# Patient Record
Sex: Female | Born: 1950
Health system: Southern US, Community
[De-identification: ages and names within clinical notes are randomized; demographics above are authoritative.]

## PROBLEM LIST (undated history)

## (undated) DIAGNOSIS — I10 Essential (primary) hypertension: Secondary | ICD-10-CM

## (undated) DIAGNOSIS — C801 Malignant (primary) neoplasm, unspecified: Secondary | ICD-10-CM

## (undated) DIAGNOSIS — M8580 Other specified disorders of bone density and structure, unspecified site: Secondary | ICD-10-CM

## (undated) DIAGNOSIS — E669 Obesity, unspecified: Secondary | ICD-10-CM

## (undated) DIAGNOSIS — M952 Other acquired deformity of head: Secondary | ICD-10-CM

## (undated) DIAGNOSIS — G9601 Cranial cerebrospinal fluid leak, spontaneous: Secondary | ICD-10-CM

## (undated) DIAGNOSIS — G960 Cerebrospinal fluid leak: Principal | ICD-10-CM

## (undated) DIAGNOSIS — E7800 Pure hypercholesterolemia, unspecified: Secondary | ICD-10-CM

## (undated) HISTORY — PX: SKIN CANCER EXCISION: SHX779

## (undated) HISTORY — PX: FACIAL NERVE SURGERY: SHX630

## (undated) HISTORY — DX: Other specified disorders of bone density and structure, unspecified site: M85.80

## (undated) HISTORY — PX: PILONIDAL CYST EXCISION: SHX744

## (undated) HISTORY — PX: EYE SURGERY: SHX253

## (undated) HISTORY — DX: Obesity, unspecified: E66.9

## (undated) HISTORY — DX: Cerebrospinal fluid leak: G96.0

## (undated) HISTORY — DX: Cranial cerebrospinal fluid leak, spontaneous: G96.01

## (undated) HISTORY — PX: TRIGGER FINGER RELEASE: SHX641

## (undated) HISTORY — PX: BREAST SURGERY: SHX581

## (undated) HISTORY — DX: Essential (primary) hypertension: I10

## (undated) HISTORY — DX: Pure hypercholesterolemia, unspecified: E78.00

---

## 1996-11-21 HISTORY — PX: BREAST BIOPSY: SHX20

## 2004-07-09 ENCOUNTER — Ambulatory Visit: Payer: Self-pay | Admitting: Gastroenterology

## 2007-08-07 ENCOUNTER — Ambulatory Visit: Payer: Self-pay | Admitting: Gastroenterology

## 2012-04-11 LAB — HM DEXA SCAN

## 2012-04-18 ENCOUNTER — Ambulatory Visit: Payer: Self-pay

## 2012-06-10 ENCOUNTER — Ambulatory Visit: Payer: Self-pay | Admitting: Emergency Medicine

## 2012-11-14 LAB — HM COLONOSCOPY

## 2013-04-19 ENCOUNTER — Ambulatory Visit: Payer: Self-pay

## 2014-04-05 LAB — HM PAP SMEAR

## 2014-04-23 ENCOUNTER — Ambulatory Visit: Payer: Self-pay

## 2014-04-23 LAB — HM MAMMOGRAPHY

## 2015-04-07 ENCOUNTER — Encounter: Payer: Self-pay | Admitting: Unknown Physician Specialty

## 2015-05-13 DIAGNOSIS — G9601 Cranial cerebrospinal fluid leak, spontaneous: Secondary | ICD-10-CM | POA: Insufficient documentation

## 2015-05-13 DIAGNOSIS — E669 Obesity, unspecified: Secondary | ICD-10-CM

## 2015-05-13 DIAGNOSIS — M8580 Other specified disorders of bone density and structure, unspecified site: Secondary | ICD-10-CM

## 2015-05-13 DIAGNOSIS — G960 Cerebrospinal fluid leak: Principal | ICD-10-CM

## 2015-05-23 ENCOUNTER — Encounter: Payer: Self-pay | Admitting: Unknown Physician Specialty

## 2015-05-23 ENCOUNTER — Ambulatory Visit (INDEPENDENT_AMBULATORY_CARE_PROVIDER_SITE_OTHER): Payer: PPO | Admitting: Unknown Physician Specialty

## 2015-05-23 VITALS — BP 141/71 | HR 80 | Temp 98.8°F | Ht 62.6 in | Wt 236.4 lb

## 2015-05-23 DIAGNOSIS — I10 Essential (primary) hypertension: Secondary | ICD-10-CM | POA: Diagnosis not present

## 2015-05-23 DIAGNOSIS — Z23 Encounter for immunization: Secondary | ICD-10-CM

## 2015-05-23 DIAGNOSIS — Z0000 Encounter for general adult medical examination without abnormal findings: Secondary | ICD-10-CM

## 2015-05-23 DIAGNOSIS — E669 Obesity, unspecified: Secondary | ICD-10-CM

## 2015-05-23 DIAGNOSIS — M8580 Other specified disorders of bone density and structure, unspecified site: Secondary | ICD-10-CM

## 2015-05-23 MED ORDER — RALOXIFENE HCL 60 MG PO TABS: 60.0000 mg | ORAL_TABLET | Freq: Every day | ORAL | Status: DC

## 2015-05-23 NOTE — Assessment & Plan Note (Signed)
Refill Evista

## 2015-05-23 NOTE — Patient Instructions (Addendum)
DASH Eating Plan DASH stands for "Dietary Approaches to Stop Hypertension." The DASH eating plan is a healthy eating plan that has been shown to reduce high blood pressure (hypertension). Additional health benefits may include reducing the risk of type 2 diabetes mellitus, heart disease, and stroke. The DASH eating plan may also help with weight loss. WHAT DO I NEED TO KNOW ABOUT THE DASH EATING PLAN? For the DASH eating plan, you will follow these general guidelines:  Choose foods with a percent daily value for sodium of less than 5% (as listed on the food label).  Use salt-free seasonings or herbs instead of table salt or sea salt.  Check with your health care provider or pharmacist before using salt substitutes.  Eat lower-sodium products, often labeled as "lower sodium" or "no salt added."  Eat fresh foods.  Eat more vegetables, fruits, and low-fat dairy products.  Choose whole grains. Look for the word "whole" as the first word in the ingredient list.  Choose fish and skinless chicken or turkey more often than red meat. Limit fish, poultry, and meat to 6 oz (170 g) each day.  Limit sweets, desserts, sugars, and sugary drinks.  Choose heart-healthy fats.  Limit cheese to 1 oz (28 g) per day.  Eat more home-cooked food and less restaurant, buffet, and fast food.  Limit fried foods.  Cook foods using methods other than frying.  Limit canned vegetables. If you do use them, rinse them well to decrease the sodium.  When eating at a restaurant, ask that your food be prepared with less salt, or no salt if possible. WHAT FOODS CAN I EAT? Seek help from a dietitian for individual calorie needs. Grains Whole grain or whole wheat bread. Brown rice. Whole grain or whole wheat pasta. Quinoa, bulgur, and whole grain cereals. Low-sodium cereals. Corn or whole wheat flour tortillas. Whole grain cornbread. Whole grain crackers. Low-sodium crackers. Vegetables Fresh or frozen vegetables  (raw, steamed, roasted, or grilled). Low-sodium or reduced-sodium tomato and vegetable juices. Low-sodium or reduced-sodium tomato sauce and paste. Low-sodium or reduced-sodium canned vegetables.  Fruits All fresh, canned (in natural juice), or frozen fruits. Meat and Other Protein Products Ground beef (85% or leaner), grass-fed beef, or beef trimmed of fat. Skinless chicken or turkey. Ground chicken or turkey. Pork trimmed of fat. All fish and seafood. Eggs. Dried beans, peas, or lentils. Unsalted nuts and seeds. Unsalted canned beans. Dairy Low-fat dairy products, such as skim or 1% milk, 2% or reduced-fat cheeses, low-fat ricotta or cottage cheese, or plain low-fat yogurt. Low-sodium or reduced-sodium cheeses. Fats and Oils Tub margarines without trans fats. Light or reduced-fat mayonnaise and salad dressings (reduced sodium). Avocado. Safflower, olive, or canola oils. Natural peanut or almond butter. Other Unsalted popcorn and pretzels. The items listed above may not be a complete list of recommended foods or beverages. Contact your dietitian for more options. WHAT FOODS ARE NOT RECOMMENDED? Grains White bread. White pasta. White rice. Refined cornbread. Bagels and croissants. Crackers that contain trans fat. Vegetables Creamed or fried vegetables. Vegetables in a cheese sauce. Regular canned vegetables. Regular canned tomato sauce and paste. Regular tomato and vegetable juices. Fruits Dried fruits. Canned fruit in light or heavy syrup. Fruit juice. Meat and Other Protein Products Fatty cuts of meat. Ribs, chicken wings, bacon, sausage, bologna, salami, chitterlings, fatback, hot dogs, bratwurst, and packaged luncheon meats. Salted nuts and seeds. Canned beans with salt. Dairy Whole or 2% milk, cream, half-and-half, and cream cheese. Whole-fat or sweetened yogurt. Full-fat   cheeses or blue cheese. Nondairy creamers and whipped toppings. Processed cheese, cheese spreads, or cheese  curds. Condiments Onion and garlic salt, seasoned salt, table salt, and sea salt. Canned and packaged gravies. Worcestershire sauce. Tartar sauce. Barbecue sauce. Teriyaki sauce. Soy sauce, including reduced sodium. Steak sauce. Fish sauce. Oyster sauce. Cocktail sauce. Horseradish. Ketchup and mustard. Meat flavorings and tenderizers. Bouillon cubes. Hot sauce. Tabasco sauce. Marinades. Taco seasonings. Relishes. Fats and Oils Butter, stick margarine, lard, shortening, ghee, and bacon fat. Coconut, palm kernel, or palm oils. Regular salad dressings. Other Pickles and olives. Salted popcorn and pretzels. The items listed above may not be a complete list of foods and beverages to avoid. Contact your dietitian for more information. WHERE CAN I FIND MORE INFORMATION? National Heart, Lung, and Blood Institute: travelstabloid.com   This information is not intended to replace advice given to you by your health care provider. Make sure you discuss any questions you have with your health care provider.   Document Released: 06/03/2011 Document Revised: 07/05/2014 Document Reviewed: 04/18/2013 Elsevier Interactive Patient Education 2016 Elsevier Inc.  Please do call to schedule your mammogram; the number to schedule one at either [facility] or [facility] is [phone]

## 2015-05-23 NOTE — Assessment & Plan Note (Addendum)
Discussed diet and exercise.  Pt ed given.  She would like to start walking

## 2015-05-23 NOTE — Progress Notes (Signed)
BP 141/71 mmHg  Pulse 80  Temp(Src) 98.8 F (37.1 C)  Ht 5' 2.6" (1.59 m)  Wt 236 lb 6.4 oz (107.23 kg)  BMI 42.42 kg/m2  SpO2 99%  LMP  (LMP Unknown)   Subjective:    Patient ID: Heather James, female    DOB: [birth year], 64 y.o.   MRN: [id number]  HPI: Heather James is a 64 y.o. female  Chief Complaint  Patient presents with  . Establish Care   Depression screen [facility] 2/9 05/23/2015  Decreased Interest 0  Down, Depressed, Hopeless 0  PHQ - 2 Score 0    Functional Status Survey: Is the patient deaf or have difficulty hearing?: Yes (deaf in right ear) Does the patient have difficulty seeing, even when wearing glasses/contacts?: Yes (cannot see without glasses) Does the patient have difficulty concentrating, remembering, or making decisions?: No Does the patient have difficulty walking or climbing stairs?: No Does the patient have difficulty dressing or bathing?: No Does the patient have difficulty doing errands alone such as visiting a doctor's office or shopping?: No  Fall Risk  05/23/2015  Falls in the past year? No   Pt states that she will be [facility] and she is due to get her EKG, Cholesterol, Carotid US and possibly hepatitis C screening.    Relevant past medical, surgical, family and social history reviewed and updated as indicated. Interim medical history since our last visit reviewed. Allergies and medications reviewed and updated.  Review of Systems  Constitutional: Negative.   HENT: Negative.   Eyes: Negative.   Respiratory: Negative.   Cardiovascular: Negative.   Gastrointestinal: Negative.   Endocrine: Negative.   Genitourinary: Negative.   Musculoskeletal: Negative.   Skin: Negative.   Allergic/Immunologic: Negative.   Neurological: Negative.   Hematological: Negative.   Psychiatric/Behavioral: Negative.     Per HPI unless specifically indicated above     Objective:    BP 141/71 mmHg  Pulse  80  Temp(Src) 98.8 F (37.1 C)  Ht 5' 2.6" (1.59 m)  Wt 236 lb 6.4 oz (107.23 kg)  BMI 42.42 kg/m2  SpO2 99%  LMP  (LMP Unknown)  Wt Readings from Last 3 Encounters:  05/23/15 236 lb 6.4 oz (107.23 kg)  04/05/14 229 lb (103.874 kg)    Physical Exam  Constitutional: She is oriented to person, place, and time. She appears well-developed and well-nourished.  HENT:  Head: Normocephalic and atraumatic.  Right Ear: External ear normal.  Left Ear: Tympanic membrane, external ear and ear canal normal.  Right ear closure  Eyes: Pupils are equal, round, and reactive to light. Right eye exhibits no discharge. Left eye exhibits no discharge. No scleral icterus.  Neck: Normal range of motion. Neck supple. Carotid bruit is not present. No thyromegaly present.  Cardiovascular: Normal rate, regular rhythm and normal heart sounds.  Exam reveals no gallop and no friction rub.   No murmur heard. Pulmonary/Chest: Effort normal and breath sounds normal. No respiratory distress. She has no wheezes. She has no rales.  Abdominal: Soft. Bowel sounds are normal. There is no tenderness. There is no rebound.  Genitourinary: No breast swelling, tenderness or discharge.  Musculoskeletal: Normal range of motion.  Lymphadenopathy:    She has no cervical adenopathy.  Neurological: She is alert and oriented to person, place, and time.  Skin: Skin is warm, dry and intact. No rash noted.  Psychiatric: She has a normal mood and affect. Her speech is normal  and behavior is normal. Judgment and thought content normal. Cognition and memory are normal.      Assessment & Plan:   Problem List Items Addressed This Visit      Unprioritized   Osteopenia    Refill Evista      Obesity    Discussed diet and exercise.  Pt ed given.  She would like to start walking      Hypertension    Borderline high.  Will give information on DASH       Other Visit Diagnoses    Routine general medical examination at a health  care facility    -  Primary    Relevant Orders    Pneumococcal conjugate vaccine 13-valent IM (Completed)    MM DIGITAL SCREENING BILATERAL       No labs as we need to find out what they do at [facility].    Follow up plan: Return in about 6 months (around 11/20/2015) for Blood pressure.

## 2015-05-23 NOTE — Assessment & Plan Note (Signed)
Borderline high.  Will give information on DASH

## 2015-05-27 ENCOUNTER — Ambulatory Visit
Admission: RE | Admit: 2015-05-27 | Discharge: 2015-05-27 | Disposition: A | Discharge status: Home / Self Care | Payer: PPO | Source: Ambulatory Visit | Attending: Unknown Physician Specialty | Admitting: Unknown Physician Specialty

## 2015-05-27 DIAGNOSIS — Z1231 Encounter for screening mammogram for malignant neoplasm of breast: Secondary | ICD-10-CM | POA: Insufficient documentation

## 2015-05-27 DIAGNOSIS — Z0000 Encounter for general adult medical examination without abnormal findings: Secondary | ICD-10-CM

## 2015-06-26 ENCOUNTER — Ambulatory Visit
Admission: EM | Admit: 2015-06-26 | Discharge: 2015-06-26 | Disposition: A | Discharge status: Home / Self Care | Payer: PPO | Attending: Family Medicine | Admitting: Family Medicine

## 2015-06-26 ENCOUNTER — Encounter: Payer: Self-pay | Admitting: *Deleted

## 2015-06-26 DIAGNOSIS — J0100 Acute maxillary sinusitis, unspecified: Secondary | ICD-10-CM | POA: Diagnosis not present

## 2015-06-26 LAB — RAPID STREP SCREEN (MED CTR MEBANE ONLY): STREPTOCOCCUS, GROUP A SCREEN (DIRECT): NEGATIVE

## 2015-06-26 MED ORDER — AMOXICILLIN-POT CLAVULANATE 875-125 MG PO TABS
1.0000 | ORAL_TABLET | Freq: Two times a day (BID) | ORAL | Status: DC
Start: 2015-06-26 — End: 2015-07-11

## 2015-06-26 NOTE — Discharge Instructions (Signed)

## 2015-06-26 NOTE — ED Provider Notes (Signed)
[facility]  ____________________________________________  Time seen: Approximately 2:52 PM  I have reviewed the triage vital signs and the nursing notes.   HISTORY  Chief Complaint Nasal Congestion and Sore Throat   HPI Khadesia Wadzinski is a 64 y.o. female presents with a complaint of one week of runny nose, nasal congestion. Reports the last 3 days she has progressed with sore throat loss of her voice as well as sinus pressure. States getting thick greenish nasal drainage. Reports occasional cough is mostly at night with drainage in the back or throat.  Denies current pain. Denies chest pain, shortness breath, abdominal pain, dizziness or weakness. Reports continues to eat and drink well. Reports that she felt she had a fever this morning but did not check it. Reports unrelieved with over-the-counter cough congestion medicines. Denies recent sickness. Denies recent antibiotic use.  PCP: C. Carrizozo    Past Medical History  Diagnosis Date  . CSF leak from ear   . Osteopenia   . Obesity     Patient Active Problem List   Diagnosis Date Noted  . Hypertension 05/23/2015  . CSF leak from ear 05/13/2015  . Osteopenia 05/13/2015  . Obesity 05/13/2015    Past Surgical History  Procedure Laterality Date  . Facial nerve surgery    . Breast surgery Left     cyst removed  . Pilonidal cyst excision    . Breast biopsy Left 11/21/96    neg  Right ear surgery.   Current Outpatient Rx  Name  Route  Sig  Dispense  Refill  . aspirin 81 MG tablet   Oral   Take 81 mg by mouth daily.         . mometasone (NASONEX) 50 MCG/ACT nasal spray   Nasal   Place 2 sprays into the nose daily.         . raloxifene (EVISTA) 60 MG tablet   Oral   Take 1 tablet (60 mg total) by mouth daily.   90 tablet   3   .             Allergies Review of patient's allergies indicates no known allergies.  Family History  Problem Relation Age of Onset  . Heart disease Mother   .  Diabetes Mother   . Hypertension Mother   . Cancer Mother     breast  . Breast cancer Mother 73  . Heart disease Father   . Diabetes Father   . Hypertension Brother   . Cancer Maternal Grandmother     spine  . Heart disease Maternal Grandfather   . Heart disease Paternal Grandmother   . Emphysema Paternal Grandfather     Social History Social History  Substance Use Topics  . Smoking status: Never Smoker   . Smokeless tobacco: Never Used  . Alcohol Use: No    Review of Systems Constitutional: Positive subjective fever Eyes: No visual changes. ENT: Positive runny nose, nasal congestion, sore throat. Cardiovascular: Denies chest pain. Respiratory: Denies shortness of breath. Gastrointestinal: No abdominal pain.  No nausea, no vomiting.  No diarrhea.  No constipation. Genitourinary: Negative for dysuria. Musculoskeletal: Negative for back pain. Skin: Negative for rash. Neurological: Negative for headaches, focal weakness or numbness.  10-point ROS otherwise negative.  ____________________________________________   PHYSICAL EXAM:  VITAL SIGNS: ED Triage Vitals  Enc Vitals Group     BP 06/26/15 1358 131/62 mmHg     Pulse Rate 06/26/15 1358 105 Recheck 88  Resp 06/26/15 1358 20     Temp 06/26/15 1358 99.3 F (37.4 C)     Temp Source 06/26/15 1358 Oral     SpO2 06/26/15 1358 100 %     Weight 06/26/15 1358 229 lb (103.874 kg)     Height 06/26/15 1358 5' 4.5" (1.638 m)     Head Cir --      Peak Flow --      Pain Score --      Pain Loc --      Pain Edu? --      Excl. in [place]? --     Constitutional: Alert and oriented. Well appearing and in no acute distress. Eyes: Conjunctivae are normal. PERRL. EOMI. Head: Atraumatic. Mild to moderate tenderness to palpation bilateral maxillary sinuses, no swelling. No erythema.  Ears: Left: No erythema, normal TM. Right: No canal present,  unable to visualize internal ear.  Nose: Nasal congestion and bilateral nasal  turbinate erythema.  Mouth/Throat: Mucous membranes are moist.  Mild pharyngeal erythema, no tonsillar swelling or exudate. Neck: No stridor.  No cervical spine tenderness to palpation. Hematological/Lymphatic/Immunilogical: No cervical lymphadenopathy. Cardiovascular: Normal rate, regular rhythm. Grossly normal heart sounds.  Good peripheral circulation. Respiratory: Normal respiratory effort.  No retractions. Lungs CTAB. No wheezes, rales or rhonchi. Good air movement.  Gastrointestinal: Soft and nontender Musculoskeletal: No lower or upper extremity tenderness nor edema.Bilateral pedal pulses equal and easily palpated.  Neurologic:  Normal speech and language. No gross focal neurologic deficits are appreciated. No gait instability. Skin:  Skin is warm, dry and intact. No rash noted. Psychiatric: Mood and affect are normal. Speech and behavior are normal.  ____________________________________________   LABS (all labs ordered are listed, but only abnormal results are displayed)  Labs Reviewed  RAPID STREP SCREEN (NOT AT [facility])  CULTURE, GROUP A STREP ([place])    INITIAL IMPRESSION / ASSESSMENT AND PLAN / ED COURSE  Pertinent labs & imaging results that were available during my care of the patient were reviewed by me and considered in my medical decision making (see chart for details).  Very well-appearing patient in no acute distress. Presents for 1 week of runny nose, nasal congestion and sinus pressure which is increasing the last 3 days. Lungs clear throughout. Abdomen soft and nontender. Will treat maxillary sinusitis with oral Augmentin and supportive treatments. Follow with PCP as needed.   Discussed follow up with Primary care physician this week. Discussed follow up and return parameters including no resolution or any worsening concerns. Patient verbalized understanding and agreed to plan.   ____________________________________________   FINAL CLINICAL IMPRESSION(S) / ED  DIAGNOSES  Final diagnoses:  Acute maxillary sinusitis, recurrence not specified       Marylene Land, NP 06/26/15 1506

## 2015-06-26 NOTE — ED Notes (Signed)
Nasal congestion started 3 days ago and has progressed to sore throat and loss of voice. Patient tried OTC medication treatment without success.

## 2015-06-28 LAB — CULTURE, GROUP A STREP (THRC)

## 2015-07-03 ENCOUNTER — Telehealth: Payer: Self-pay | Admitting: Unknown Physician Specialty

## 2015-07-03 NOTE — Telephone Encounter (Signed)
Patient went to [facility] urgent care and she was diagnosed with acute membrane sinusitis. Patient is still not feeling well and wants to know if Malachy Mood can call her in something else for it. Pt best number to call is [phone].

## 2015-07-03 NOTE — Telephone Encounter (Signed)
Routing to provider  

## 2015-07-04 NOTE — Telephone Encounter (Signed)
Tried to call patient but there was no answer and no voicemail so I will try to call again later.

## 2015-07-04 NOTE — Telephone Encounter (Signed)
Patient returned my call and I told her what Heather James said. Patient stated she was going to call [facility].

## 2015-07-04 NOTE — Telephone Encounter (Signed)
She can call urgent care or be seen here

## 2015-07-11 ENCOUNTER — Encounter: Payer: Self-pay | Admitting: Unknown Physician Specialty

## 2015-07-11 ENCOUNTER — Ambulatory Visit (INDEPENDENT_AMBULATORY_CARE_PROVIDER_SITE_OTHER): Payer: PPO | Admitting: Unknown Physician Specialty

## 2015-07-11 VITALS — BP 145/91 | HR 74 | Temp 98.3°F | Ht 62.5 in | Wt 234.6 lb

## 2015-07-11 DIAGNOSIS — R05 Cough: Secondary | ICD-10-CM

## 2015-07-11 DIAGNOSIS — J0101 Acute recurrent maxillary sinusitis: Secondary | ICD-10-CM

## 2015-07-11 DIAGNOSIS — R059 Cough, unspecified: Secondary | ICD-10-CM

## 2015-07-11 MED ORDER — DOXYCYCLINE HYCLATE 100 MG PO TABS: 100.0000 mg | ORAL_TABLET | Freq: Two times a day (BID) | ORAL | Status: DC

## 2015-07-11 MED ORDER — HYDROCOD POLST-CPM POLST ER 10-8 MG/5ML PO SUER: 5.0000 mL | Freq: Two times a day (BID) | ORAL | Status: DC | PRN

## 2015-07-11 NOTE — Progress Notes (Signed)
BP 145/91 mmHg  Pulse 74  Temp(Src) 98.3 F (36.8 C)  Ht 5' 2.5" (1.588 m)  Wt 234 lb 9.6 oz (106.414 kg)  BMI 42.20 kg/m2  SpO2 98%  LMP  (LMP Unknown)   Subjective:    Patient ID: Heather James, female    DOB: 08-31-1950, 65 y.o.   MRN: [id number]  HPI: Heather James is a 65 y.o. female  Chief Complaint  Patient presents with  . URI    pt states she was seen at [facility], given amoxicillin, but when she finished taking it, she states she started feeling bad again. Cough, nasal pressure, and tiredness.    Diagnosed with sinusitis 2 weeks ago.  Felt better during Amoxicillin but worse again.  Getting chills, hacking cough, and sinus pressure.  Fatigue after the "first little thing."  Relevant past medical, surgical, family and social history reviewed and updated as indicated. Interim medical history since our last visit reviewed. Allergies and medications reviewed and updated.  Review of Systems  Per HPI unless specifically indicated above     Objective:    BP 145/91 mmHg  Pulse 74  Temp(Src) 98.3 F (36.8 C)  Ht 5' 2.5" (1.588 m)  Wt 234 lb 9.6 oz (106.414 kg)  BMI 42.20 kg/m2  SpO2 98%  LMP  (LMP Unknown)  Wt Readings from Last 3 Encounters:  07/11/15 234 lb 9.6 oz (106.414 kg)  06/26/15 229 lb (103.874 kg)  05/23/15 236 lb 6.4 oz (107.23 kg)    Physical Exam  Constitutional: She is oriented to person, place, and time. She appears well-developed and well-nourished. No distress.  HENT:  Head: Normocephalic and atraumatic.  Right Ear: Tympanic membrane and ear canal normal.  Left Ear: Tympanic membrane and ear canal normal.  Nose: No rhinorrhea. Right sinus exhibits maxillary sinus tenderness. Right sinus exhibits no frontal sinus tenderness. Left sinus exhibits maxillary sinus tenderness. Left sinus exhibits no frontal sinus tenderness.  Eyes: Conjunctivae and lids are normal. Right eye exhibits no discharge. Left eye exhibits no discharge.  No scleral icterus.  Cardiovascular: Normal rate and regular rhythm.   Pulmonary/Chest: Effort normal and breath sounds normal. No respiratory distress.  Abdominal: Normal appearance. There is no splenomegaly or hepatomegaly.  Musculoskeletal: Normal range of motion.  Neurological: She is alert and oriented to person, place, and time.  Skin: Skin is intact. No rash noted. No pallor.  Psychiatric: She has a normal mood and affect. Her behavior is normal. Judgment and thought content normal.    Results for orders placed or performed during the hospital encounter of 06/26/15  Rapid strep screen  Result Value Ref Range   Streptococcus, Group A Screen (Direct) NEGATIVE NEGATIVE  Culture, group A strep ([facility] only)  Result Value Ref Range   Specimen Description THROAT    Special Requests NONE Reflexed from [id number]    Culture NO BETA HEMOLYTIC STREPTOCOCCI ISOLATED    Report Status 06/28/2015 FINAL       Assessment & Plan:   Problem List Items Addressed This Visit    None    Visit Diagnoses    Acute recurrent maxillary sinusitis    -  Primary    Relevant Medications    doxycycline (VIBRA-TABS) 100 MG tablet    chlorpheniramine-HYDROcodone (TUSSIONEX PENNKINETIC ER) 10-8 MG/5ML SUER    Cough        Tussionex for cough        Follow up plan: Return if symptoms worsen or fail  to improve.

## 2015-07-21 ENCOUNTER — Ambulatory Visit: Payer: PPO | Admitting: Unknown Physician Specialty

## 2015-07-29 DIAGNOSIS — H2513 Age-related nuclear cataract, bilateral: Secondary | ICD-10-CM | POA: Diagnosis not present

## 2015-08-25 DIAGNOSIS — D0471 Carcinoma in situ of skin of right lower limb, including hip: Secondary | ICD-10-CM | POA: Diagnosis not present

## 2015-08-25 DIAGNOSIS — D485 Neoplasm of uncertain behavior of skin: Secondary | ICD-10-CM | POA: Diagnosis not present

## 2015-10-20 DIAGNOSIS — D0471 Carcinoma in situ of skin of right lower limb, including hip: Secondary | ICD-10-CM | POA: Diagnosis not present

## 2015-12-01 ENCOUNTER — Ambulatory Visit: Payer: PPO | Admitting: Unknown Physician Specialty

## 2015-12-08 ENCOUNTER — Encounter: Payer: Self-pay | Admitting: Unknown Physician Specialty

## 2015-12-08 ENCOUNTER — Ambulatory Visit (INDEPENDENT_AMBULATORY_CARE_PROVIDER_SITE_OTHER): Payer: PPO | Admitting: Unknown Physician Specialty

## 2015-12-08 VITALS — BP 129/81 | HR 83 | Temp 98.0°F | Ht 62.0 in | Wt 235.2 lb

## 2015-12-08 DIAGNOSIS — Z0000 Encounter for general adult medical examination without abnormal findings: Secondary | ICD-10-CM | POA: Diagnosis not present

## 2015-12-08 NOTE — Progress Notes (Signed)
   BP 129/81 mmHg  Pulse 83  Temp(Src) 98 F (36.7 C)  Ht 5\' 2"  (1.575 m)  Wt 235 lb 3.2 oz (106.686 kg)  BMI 43.01 kg/m2  SpO2 97%  LMP  (LMP Unknown)   Subjective:    Patient ID: Heather James, female    DOB: [birth year], 65 y.o.   MRN: [id number]  HPI: Heather James is a 66 y.o. female  Chief Complaint  Patient presents with  . Hypertension  . Labs Only    Hep C and HIV orders entered   Hypertension Stable not on BP medications   Relevant past medical, surgical, family and social history reviewed and updated as indicated. Interim medical history since our last visit reviewed. Allergies and medications reviewed and updated.  Review of Systems  Per HPI unless specifically indicated above     Objective:    BP 129/81 mmHg  Pulse 83  Temp(Src) 98 F (36.7 C)  Ht 5\' 2"  (1.575 m)  Wt 235 lb 3.2 oz (106.686 kg)  BMI 43.01 kg/m2  SpO2 97%  LMP  (LMP Unknown)  Wt Readings from Last 3 Encounters:  12/08/15 235 lb 3.2 oz (106.686 kg)  07/11/15 234 lb 9.6 oz (106.414 kg)  06/26/15 229 lb (103.874 kg)    Physical Exam  Constitutional: She is oriented to person, place, and time. She appears well-developed and well-nourished. No distress.  HENT:  Head: Normocephalic and atraumatic.  Eyes: Conjunctivae and lids are normal. Right eye exhibits no discharge. Left eye exhibits no discharge. No scleral icterus.  Cardiovascular: Normal rate.   Pulmonary/Chest: Effort normal.  Abdominal: Normal appearance. There is no splenomegaly or hepatomegaly.  Musculoskeletal: Normal range of motion.  Neurological: She is alert and oriented to person, place, and time.  Skin: Skin is intact. No rash noted. No pallor.  Psychiatric: She has a normal mood and affect. Her behavior is normal. Judgment and thought content normal.    Results for orders placed or performed during the hospital encounter of 06/26/15  Rapid strep screen  Result Value Ref Range   Streptococcus, Group A  Screen (Direct) NEGATIVE NEGATIVE  Culture, group A strep ([facility] only)  Result Value Ref Range   Specimen Description THROAT    Special Requests NONE Reflexed from [id number]    Culture NO BETA HEMOLYTIC STREPTOCOCCI ISOLATED    Report Status 06/28/2015 FINAL       Assessment & Plan:   Problem List Items Addressed This Visit    None    Visit Diagnoses    Health care maintenance    -  Primary    Relevant Orders    Hepatitis C antibody    HIV antibody        Follow up plan: Return for Recheck in November for PE.

## 2015-12-09 LAB — HIV ANTIBODY (ROUTINE TESTING W REFLEX): HIV Screen 4th Generation wRfx: NONREACTIVE

## 2015-12-09 LAB — HEPATITIS C ANTIBODY: Hep C Virus Ab: <0.1 s/co ratio (ref 0.0–0.9)

## 2016-01-12 DIAGNOSIS — M955 Acquired deformity of pelvis: Secondary | ICD-10-CM | POA: Diagnosis not present

## 2016-01-12 DIAGNOSIS — M5136 Other intervertebral disc degeneration, lumbar region: Secondary | ICD-10-CM | POA: Diagnosis not present

## 2016-01-12 DIAGNOSIS — M9903 Segmental and somatic dysfunction of lumbar region: Secondary | ICD-10-CM | POA: Diagnosis not present

## 2016-01-12 DIAGNOSIS — M9905 Segmental and somatic dysfunction of pelvic region: Secondary | ICD-10-CM | POA: Diagnosis not present

## 2016-01-14 DIAGNOSIS — M5136 Other intervertebral disc degeneration, lumbar region: Secondary | ICD-10-CM | POA: Diagnosis not present

## 2016-01-14 DIAGNOSIS — M9903 Segmental and somatic dysfunction of lumbar region: Secondary | ICD-10-CM | POA: Diagnosis not present

## 2016-01-14 DIAGNOSIS — M955 Acquired deformity of pelvis: Secondary | ICD-10-CM | POA: Diagnosis not present

## 2016-01-14 DIAGNOSIS — M9905 Segmental and somatic dysfunction of pelvic region: Secondary | ICD-10-CM | POA: Diagnosis not present

## 2016-01-16 DIAGNOSIS — M955 Acquired deformity of pelvis: Secondary | ICD-10-CM | POA: Diagnosis not present

## 2016-01-16 DIAGNOSIS — M9903 Segmental and somatic dysfunction of lumbar region: Secondary | ICD-10-CM | POA: Diagnosis not present

## 2016-01-16 DIAGNOSIS — M9905 Segmental and somatic dysfunction of pelvic region: Secondary | ICD-10-CM | POA: Diagnosis not present

## 2016-01-16 DIAGNOSIS — M5136 Other intervertebral disc degeneration, lumbar region: Secondary | ICD-10-CM | POA: Diagnosis not present

## 2016-01-19 DIAGNOSIS — M9903 Segmental and somatic dysfunction of lumbar region: Secondary | ICD-10-CM | POA: Diagnosis not present

## 2016-01-19 DIAGNOSIS — M5136 Other intervertebral disc degeneration, lumbar region: Secondary | ICD-10-CM | POA: Diagnosis not present

## 2016-01-19 DIAGNOSIS — M9905 Segmental and somatic dysfunction of pelvic region: Secondary | ICD-10-CM | POA: Diagnosis not present

## 2016-01-19 DIAGNOSIS — M955 Acquired deformity of pelvis: Secondary | ICD-10-CM | POA: Diagnosis not present

## 2016-01-21 DIAGNOSIS — M9905 Segmental and somatic dysfunction of pelvic region: Secondary | ICD-10-CM | POA: Diagnosis not present

## 2016-01-21 DIAGNOSIS — M5136 Other intervertebral disc degeneration, lumbar region: Secondary | ICD-10-CM | POA: Diagnosis not present

## 2016-01-21 DIAGNOSIS — M9903 Segmental and somatic dysfunction of lumbar region: Secondary | ICD-10-CM | POA: Diagnosis not present

## 2016-01-21 DIAGNOSIS — M955 Acquired deformity of pelvis: Secondary | ICD-10-CM | POA: Diagnosis not present

## 2016-01-23 DIAGNOSIS — M9903 Segmental and somatic dysfunction of lumbar region: Secondary | ICD-10-CM | POA: Diagnosis not present

## 2016-01-23 DIAGNOSIS — M955 Acquired deformity of pelvis: Secondary | ICD-10-CM | POA: Diagnosis not present

## 2016-01-23 DIAGNOSIS — M9905 Segmental and somatic dysfunction of pelvic region: Secondary | ICD-10-CM | POA: Diagnosis not present

## 2016-01-23 DIAGNOSIS — M5136 Other intervertebral disc degeneration, lumbar region: Secondary | ICD-10-CM | POA: Diagnosis not present

## 2016-01-26 DIAGNOSIS — M9903 Segmental and somatic dysfunction of lumbar region: Secondary | ICD-10-CM | POA: Diagnosis not present

## 2016-01-26 DIAGNOSIS — M5136 Other intervertebral disc degeneration, lumbar region: Secondary | ICD-10-CM | POA: Diagnosis not present

## 2016-01-26 DIAGNOSIS — M9905 Segmental and somatic dysfunction of pelvic region: Secondary | ICD-10-CM | POA: Diagnosis not present

## 2016-01-26 DIAGNOSIS — M955 Acquired deformity of pelvis: Secondary | ICD-10-CM | POA: Diagnosis not present

## 2016-01-28 DIAGNOSIS — M955 Acquired deformity of pelvis: Secondary | ICD-10-CM | POA: Diagnosis not present

## 2016-01-28 DIAGNOSIS — M5136 Other intervertebral disc degeneration, lumbar region: Secondary | ICD-10-CM | POA: Diagnosis not present

## 2016-01-28 DIAGNOSIS — M9905 Segmental and somatic dysfunction of pelvic region: Secondary | ICD-10-CM | POA: Diagnosis not present

## 2016-01-28 DIAGNOSIS — M9903 Segmental and somatic dysfunction of lumbar region: Secondary | ICD-10-CM | POA: Diagnosis not present

## 2016-01-30 DIAGNOSIS — M5136 Other intervertebral disc degeneration, lumbar region: Secondary | ICD-10-CM | POA: Diagnosis not present

## 2016-01-30 DIAGNOSIS — M9903 Segmental and somatic dysfunction of lumbar region: Secondary | ICD-10-CM | POA: Diagnosis not present

## 2016-01-30 DIAGNOSIS — M9905 Segmental and somatic dysfunction of pelvic region: Secondary | ICD-10-CM | POA: Diagnosis not present

## 2016-01-30 DIAGNOSIS — M955 Acquired deformity of pelvis: Secondary | ICD-10-CM | POA: Diagnosis not present

## 2016-02-02 DIAGNOSIS — M5136 Other intervertebral disc degeneration, lumbar region: Secondary | ICD-10-CM | POA: Diagnosis not present

## 2016-02-02 DIAGNOSIS — M9903 Segmental and somatic dysfunction of lumbar region: Secondary | ICD-10-CM | POA: Diagnosis not present

## 2016-02-02 DIAGNOSIS — M955 Acquired deformity of pelvis: Secondary | ICD-10-CM | POA: Diagnosis not present

## 2016-02-02 DIAGNOSIS — M9905 Segmental and somatic dysfunction of pelvic region: Secondary | ICD-10-CM | POA: Diagnosis not present

## 2016-02-04 DIAGNOSIS — M5136 Other intervertebral disc degeneration, lumbar region: Secondary | ICD-10-CM | POA: Diagnosis not present

## 2016-02-04 DIAGNOSIS — M955 Acquired deformity of pelvis: Secondary | ICD-10-CM | POA: Diagnosis not present

## 2016-02-04 DIAGNOSIS — M9903 Segmental and somatic dysfunction of lumbar region: Secondary | ICD-10-CM | POA: Diagnosis not present

## 2016-02-04 DIAGNOSIS — M9905 Segmental and somatic dysfunction of pelvic region: Secondary | ICD-10-CM | POA: Diagnosis not present

## 2016-02-06 DIAGNOSIS — M9905 Segmental and somatic dysfunction of pelvic region: Secondary | ICD-10-CM | POA: Diagnosis not present

## 2016-02-06 DIAGNOSIS — M955 Acquired deformity of pelvis: Secondary | ICD-10-CM | POA: Diagnosis not present

## 2016-02-06 DIAGNOSIS — M9903 Segmental and somatic dysfunction of lumbar region: Secondary | ICD-10-CM | POA: Diagnosis not present

## 2016-02-06 DIAGNOSIS — M5136 Other intervertebral disc degeneration, lumbar region: Secondary | ICD-10-CM | POA: Diagnosis not present

## 2016-02-09 DIAGNOSIS — M5136 Other intervertebral disc degeneration, lumbar region: Secondary | ICD-10-CM | POA: Diagnosis not present

## 2016-02-09 DIAGNOSIS — M955 Acquired deformity of pelvis: Secondary | ICD-10-CM | POA: Diagnosis not present

## 2016-02-09 DIAGNOSIS — M9905 Segmental and somatic dysfunction of pelvic region: Secondary | ICD-10-CM | POA: Diagnosis not present

## 2016-02-09 DIAGNOSIS — M9903 Segmental and somatic dysfunction of lumbar region: Secondary | ICD-10-CM | POA: Diagnosis not present

## 2016-02-13 DIAGNOSIS — M9905 Segmental and somatic dysfunction of pelvic region: Secondary | ICD-10-CM | POA: Diagnosis not present

## 2016-02-13 DIAGNOSIS — M9903 Segmental and somatic dysfunction of lumbar region: Secondary | ICD-10-CM | POA: Diagnosis not present

## 2016-02-13 DIAGNOSIS — M5136 Other intervertebral disc degeneration, lumbar region: Secondary | ICD-10-CM | POA: Diagnosis not present

## 2016-02-13 DIAGNOSIS — M955 Acquired deformity of pelvis: Secondary | ICD-10-CM | POA: Diagnosis not present

## 2016-02-16 DIAGNOSIS — M9903 Segmental and somatic dysfunction of lumbar region: Secondary | ICD-10-CM | POA: Diagnosis not present

## 2016-02-16 DIAGNOSIS — M5136 Other intervertebral disc degeneration, lumbar region: Secondary | ICD-10-CM | POA: Diagnosis not present

## 2016-02-16 DIAGNOSIS — M955 Acquired deformity of pelvis: Secondary | ICD-10-CM | POA: Diagnosis not present

## 2016-02-16 DIAGNOSIS — M9905 Segmental and somatic dysfunction of pelvic region: Secondary | ICD-10-CM | POA: Diagnosis not present

## 2016-02-18 DIAGNOSIS — M9905 Segmental and somatic dysfunction of pelvic region: Secondary | ICD-10-CM | POA: Diagnosis not present

## 2016-02-18 DIAGNOSIS — M9903 Segmental and somatic dysfunction of lumbar region: Secondary | ICD-10-CM | POA: Diagnosis not present

## 2016-02-18 DIAGNOSIS — M955 Acquired deformity of pelvis: Secondary | ICD-10-CM | POA: Diagnosis not present

## 2016-02-18 DIAGNOSIS — M5136 Other intervertebral disc degeneration, lumbar region: Secondary | ICD-10-CM | POA: Diagnosis not present

## 2016-02-20 DIAGNOSIS — D2261 Melanocytic nevi of right upper limb, including shoulder: Secondary | ICD-10-CM | POA: Diagnosis not present

## 2016-02-20 DIAGNOSIS — D2371 Other benign neoplasm of skin of right lower limb, including hip: Secondary | ICD-10-CM | POA: Diagnosis not present

## 2016-02-20 DIAGNOSIS — D2262 Melanocytic nevi of left upper limb, including shoulder: Secondary | ICD-10-CM | POA: Diagnosis not present

## 2016-02-20 DIAGNOSIS — D225 Melanocytic nevi of trunk: Secondary | ICD-10-CM | POA: Diagnosis not present

## 2016-02-23 DIAGNOSIS — M955 Acquired deformity of pelvis: Secondary | ICD-10-CM | POA: Diagnosis not present

## 2016-02-23 DIAGNOSIS — M9905 Segmental and somatic dysfunction of pelvic region: Secondary | ICD-10-CM | POA: Diagnosis not present

## 2016-02-23 DIAGNOSIS — M5136 Other intervertebral disc degeneration, lumbar region: Secondary | ICD-10-CM | POA: Diagnosis not present

## 2016-02-23 DIAGNOSIS — M9903 Segmental and somatic dysfunction of lumbar region: Secondary | ICD-10-CM | POA: Diagnosis not present

## 2016-03-02 DIAGNOSIS — M5136 Other intervertebral disc degeneration, lumbar region: Secondary | ICD-10-CM | POA: Diagnosis not present

## 2016-03-02 DIAGNOSIS — M9903 Segmental and somatic dysfunction of lumbar region: Secondary | ICD-10-CM | POA: Diagnosis not present

## 2016-03-02 DIAGNOSIS — M955 Acquired deformity of pelvis: Secondary | ICD-10-CM | POA: Diagnosis not present

## 2016-03-02 DIAGNOSIS — M9905 Segmental and somatic dysfunction of pelvic region: Secondary | ICD-10-CM | POA: Diagnosis not present

## 2016-03-09 DIAGNOSIS — M9905 Segmental and somatic dysfunction of pelvic region: Secondary | ICD-10-CM | POA: Diagnosis not present

## 2016-03-09 DIAGNOSIS — M9903 Segmental and somatic dysfunction of lumbar region: Secondary | ICD-10-CM | POA: Diagnosis not present

## 2016-03-09 DIAGNOSIS — M5136 Other intervertebral disc degeneration, lumbar region: Secondary | ICD-10-CM | POA: Diagnosis not present

## 2016-03-09 DIAGNOSIS — M955 Acquired deformity of pelvis: Secondary | ICD-10-CM | POA: Diagnosis not present

## 2016-03-19 ENCOUNTER — Encounter: Payer: Self-pay | Admitting: Unknown Physician Specialty

## 2016-03-19 ENCOUNTER — Ambulatory Visit (INDEPENDENT_AMBULATORY_CARE_PROVIDER_SITE_OTHER): Payer: PPO | Admitting: Unknown Physician Specialty

## 2016-03-19 VITALS — BP 131/84 | HR 94 | Temp 98.5°F | Wt 232.8 lb

## 2016-03-19 DIAGNOSIS — J069 Acute upper respiratory infection, unspecified: Secondary | ICD-10-CM

## 2016-03-19 DIAGNOSIS — J40 Bronchitis, not specified as acute or chronic: Secondary | ICD-10-CM | POA: Diagnosis not present

## 2016-03-19 MED ORDER — HYDROCOD POLST-CPM POLST ER 10-8 MG/5ML PO SUER: 5.0000 mL | Freq: Two times a day (BID) | ORAL | 0 refills | Status: DC | PRN

## 2016-03-19 MED ORDER — AZITHROMYCIN 250 MG PO TABS: ORAL_TABLET | ORAL | 0 refills | Status: DC

## 2016-03-19 NOTE — Progress Notes (Signed)
BP 131/84 (BP Location: Left Arm, Patient Position: Sitting, Cuff Size: Large)   Pulse 94   Temp 98.5 F (36.9 C)   Wt 232 lb 12.8 oz (105.6 kg)   LMP  (LMP Unknown)   SpO2 97%   BMI 42.58 kg/m    Subjective:    Patient ID: Heather James, female    DOB: [birth year], 65 y.o.   MRN: [id number]  HPI: Heather James is a 65 y.o. female  Chief Complaint  Patient presents with  . URI    pt states she has been having a cough, chest congestiong, sinus pressure, fever, and headache since Monday. States she has tried taking Mucinex.    URI   This is a new problem. The current episode started in the past 7 days. The problem has been unchanged. Maximum temperature: off and on as high as 101. Associated symptoms include chest pain, congestion, coughing, headaches and sinus pain. Pertinent negatives include no ear pain. Associated symptoms comments: Cough and headache are the worst and keeping her from sleeping. Treatments tried: Mucinex and cough syrup. The treatment provided no relief.    Relevant past medical, surgical, family and social history reviewed and updated as indicated. Interim medical history since our last visit reviewed. Allergies and medications reviewed and updated.  Review of Systems  HENT: Positive for congestion. Negative for ear pain.   Respiratory: Positive for cough.   Cardiovascular: Positive for chest pain.  Neurological: Positive for headaches.    Per HPI unless specifically indicated above     Objective:    BP 131/84 (BP Location: Left Arm, Patient Position: Sitting, Cuff Size: Large)   Pulse 94   Temp 98.5 F (36.9 C)   Wt 232 lb 12.8 oz (105.6 kg)   LMP  (LMP Unknown)   SpO2 97%   BMI 42.58 kg/m   Wt Readings from Last 3 Encounters:  03/19/16 232 lb 12.8 oz (105.6 kg)  12/08/15 235 lb 3.2 oz (106.7 kg)  07/11/15 234 lb 9.6 oz (106.4 kg)    Physical Exam  Constitutional: She is oriented to person, place, and time. She appears  well-developed and well-nourished. No distress.  HENT:  Head: Normocephalic and atraumatic.  Right Ear: Tympanic membrane and ear canal normal.  Left Ear: Tympanic membrane and ear canal normal.  Nose: Rhinorrhea present. Right sinus exhibits no maxillary sinus tenderness and no frontal sinus tenderness. Left sinus exhibits no maxillary sinus tenderness and no frontal sinus tenderness.  Mouth/Throat: Mucous membranes are normal. Posterior oropharyngeal erythema present.  Eyes: Conjunctivae and lids are normal. Right eye exhibits no discharge. Left eye exhibits no discharge. No scleral icterus.  Cardiovascular: Normal rate and regular rhythm.   Pulmonary/Chest: Effort normal. No respiratory distress. She has decreased breath sounds. She has no wheezes. She has rhonchi in the right lower field and the left lower field. She has rales in the left lower field.  Abdominal: Normal appearance. There is no splenomegaly or hepatomegaly.  Musculoskeletal: Normal range of motion.  Neurological: She is alert and oriented to person, place, and time.  Skin: Skin is intact. No rash noted. No pallor.  Psychiatric: She has a normal mood and affect. Her behavior is normal. Judgment and thought content normal.  Nursing note and vitals reviewed.   Results for orders placed or performed in visit on 12/08/15  Hepatitis C antibody  Result Value Ref Range   Hep C Virus Ab <0.1 0.0 - 0.9 s/co ratio  HIV antibody  Result Value Ref Range   HIV Screen 4th Generation wRfx Non Reactive Non Reactive      Assessment & Plan:   Problem List Items Addressed This Visit    None    Visit Diagnoses    URI (upper respiratory infection)    -  Primary   Relevant Medications   azithromycin (ZITHROMAX) 250 MG tablet   Bronchitis           Follow up plan: Return if symptoms worsen or fail to improve.

## 2016-03-23 DIAGNOSIS — M955 Acquired deformity of pelvis: Secondary | ICD-10-CM | POA: Diagnosis not present

## 2016-03-23 DIAGNOSIS — M9903 Segmental and somatic dysfunction of lumbar region: Secondary | ICD-10-CM | POA: Diagnosis not present

## 2016-03-23 DIAGNOSIS — M9905 Segmental and somatic dysfunction of pelvic region: Secondary | ICD-10-CM | POA: Diagnosis not present

## 2016-03-23 DIAGNOSIS — M5136 Other intervertebral disc degeneration, lumbar region: Secondary | ICD-10-CM | POA: Diagnosis not present

## 2016-03-24 ENCOUNTER — Telehealth: Payer: Self-pay | Admitting: Unknown Physician Specialty

## 2016-03-24 NOTE — Telephone Encounter (Signed)
Called and spoke to patient. She states that the fever is gone and she feels better than she did when she came in. She states that she is still coughing up phlegm. Patient wants to know if she should give the z-pack a little more time or does she need something else sent in.

## 2016-03-24 NOTE — Telephone Encounter (Signed)
Patient notified of Cheryl's instructions.

## 2016-03-24 NOTE — Telephone Encounter (Signed)
I would give it another couple of days.  Tell her to let us know on Friday if she is not continuing to get better

## 2016-03-24 NOTE — Telephone Encounter (Signed)
Please ask her to verify.  If she is feeling no better, another z pack is unlikely to help and will need to try something different.  If feeling some better, it should continue to work for 10 days.

## 2016-03-30 DIAGNOSIS — M955 Acquired deformity of pelvis: Secondary | ICD-10-CM | POA: Diagnosis not present

## 2016-03-30 DIAGNOSIS — M9903 Segmental and somatic dysfunction of lumbar region: Secondary | ICD-10-CM | POA: Diagnosis not present

## 2016-03-30 DIAGNOSIS — M5136 Other intervertebral disc degeneration, lumbar region: Secondary | ICD-10-CM | POA: Diagnosis not present

## 2016-03-30 DIAGNOSIS — M9905 Segmental and somatic dysfunction of pelvic region: Secondary | ICD-10-CM | POA: Diagnosis not present

## 2016-04-22 ENCOUNTER — Other Ambulatory Visit: Payer: Self-pay | Admitting: Unknown Physician Specialty

## 2016-04-22 DIAGNOSIS — Z1231 Encounter for screening mammogram for malignant neoplasm of breast: Secondary | ICD-10-CM

## 2016-04-27 DIAGNOSIS — M9903 Segmental and somatic dysfunction of lumbar region: Secondary | ICD-10-CM | POA: Diagnosis not present

## 2016-04-27 DIAGNOSIS — M955 Acquired deformity of pelvis: Secondary | ICD-10-CM | POA: Diagnosis not present

## 2016-04-27 DIAGNOSIS — M5136 Other intervertebral disc degeneration, lumbar region: Secondary | ICD-10-CM | POA: Diagnosis not present

## 2016-04-27 DIAGNOSIS — M9905 Segmental and somatic dysfunction of pelvic region: Secondary | ICD-10-CM | POA: Diagnosis not present

## 2016-05-05 ENCOUNTER — Ambulatory Visit (INDEPENDENT_AMBULATORY_CARE_PROVIDER_SITE_OTHER): Payer: PPO

## 2016-05-05 ENCOUNTER — Ambulatory Visit (INDEPENDENT_AMBULATORY_CARE_PROVIDER_SITE_OTHER): Payer: PPO | Admitting: Podiatry

## 2016-05-05 ENCOUNTER — Encounter: Payer: Self-pay | Admitting: Podiatry

## 2016-05-05 VITALS — BP 132/75 | HR 74 | Resp 16 | Ht 63.0 in | Wt 231.0 lb

## 2016-05-05 DIAGNOSIS — M779 Enthesopathy, unspecified: Secondary | ICD-10-CM

## 2016-05-05 DIAGNOSIS — M79671 Pain in right foot: Secondary | ICD-10-CM

## 2016-05-05 MED ORDER — TRIAMCINOLONE ACETONIDE 10 MG/ML IJ SUSP
10.0000 mg | Freq: Once | INTRAMUSCULAR | Status: AC
  Administered 2016-05-05: 10 mg

## 2016-05-05 NOTE — Progress Notes (Signed)
   Subjective:    Patient ID: Heather James, female    DOB: May 30, 1951, 65 y.o.   MRN: [id number]  HPI Chief Complaint  Patient presents with  . Foot Pain    Right foot; lateral side-below 5th toe; pt stated, "Was just walking and sock got caught up in tennis shoe a couple of weeks ago"      Review of Systems  All other systems reviewed and are negative.      Objective:   Physical Exam        Assessment & Plan:

## 2016-05-05 NOTE — Progress Notes (Signed)
Subjective:     Patient ID: Heather James, female   DOB: 1950-12-17, 65 y.o.   MRN: [id number]  HPI patient states she's developed a lot of pain in the outside of her right foot over the last month and does not remember specific injury   Review of Systems  All other systems reviewed and are negative.      Objective:   Physical Exam  Constitutional: She is oriented to person, place, and time.  Cardiovascular: Intact distal pulses.   Musculoskeletal: Normal range of motion.  Neurological: She is alert and oriented to person, place, and time.  Skin: Skin is warm.  Nursing note and vitals reviewed.  neurovascular status intact muscle strength adequate and inflammation pain in the lateral side of the right foot around the peroneal insertion into the base of the fifth metatarsal. There is fluid buildup around this area but no muscle strength loss or other indications of tendon dysfunction     Assessment:     Probability for acute peroneal tendinitis which may have occurred secondary to activity that she was not aware    Plan:     H&P x-rays reviewed and today careful sheath injection administered right 3 mg Kenalog 5 mill grams Xylocaine advised on physical therapy and went ahead and placed in brace to lift the lateral side of the foot along with ice therapy. Reappoint to recheck  X-Abeln report was negative for signs of fracture of the area or other bone pathology

## 2016-05-24 ENCOUNTER — Ambulatory Visit (INDEPENDENT_AMBULATORY_CARE_PROVIDER_SITE_OTHER): Payer: PPO | Admitting: Unknown Physician Specialty

## 2016-05-24 ENCOUNTER — Encounter: Payer: Self-pay | Admitting: Unknown Physician Specialty

## 2016-05-24 VITALS — BP 138/84 | HR 78 | Temp 98.4°F | Ht 62.7 in | Wt 233.0 lb

## 2016-05-24 DIAGNOSIS — I10 Essential (primary) hypertension: Secondary | ICD-10-CM | POA: Diagnosis not present

## 2016-05-24 DIAGNOSIS — M7750 Other enthesopathy of unspecified foot: Secondary | ICD-10-CM | POA: Diagnosis not present

## 2016-05-24 DIAGNOSIS — Z7189 Other specified counseling: Secondary | ICD-10-CM

## 2016-05-24 DIAGNOSIS — Z23 Encounter for immunization: Secondary | ICD-10-CM

## 2016-05-24 DIAGNOSIS — M8580 Other specified disorders of bone density and structure, unspecified site: Secondary | ICD-10-CM

## 2016-05-24 DIAGNOSIS — Z0000 Encounter for general adult medical examination without abnormal findings: Secondary | ICD-10-CM

## 2016-05-24 MED ORDER — RALOXIFENE HCL 60 MG PO TABS: 60.0000 mg | ORAL_TABLET | Freq: Every day | ORAL | 3 refills | Status: DC

## 2016-05-24 NOTE — Assessment & Plan Note (Signed)
Continue Evista

## 2016-05-24 NOTE — Assessment & Plan Note (Signed)
High here in the office.  Continue lifestyle modifications.  Monitor with home BP cuff

## 2016-05-24 NOTE — Progress Notes (Signed)
BP 138/84 (BP Location: Left Arm, Patient Position: Sitting, Cuff Size: Large)   Pulse 78   Temp 98.4 F (36.9 C)   Ht 5' 2.7" (1.593 m)   Wt 233 lb (105.7 kg)   LMP  (LMP Unknown)   SpO2 96%   BMI 41.67 kg/m    Subjective:    Patient ID: Heather James, female    DOB: 10/12/1950, 65 y.o.   MRN: [id number]  HPI: Heather James is a 65 y.o. female  Chief Complaint  Patient presents with  . Medicare Wellness  . 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  Tendonitis in her foot has kept her from walking  Bone density Doing well with Evista.    Relevant past medical, surgical, family and social history reviewed and updated as indicated. Interim medical history since our last visit reviewed. Allergies and medications reviewed and updated.  Review of Systems  Constitutional: Negative.   HENT: Negative.   Eyes: Negative.   Respiratory: Negative.   Cardiovascular: Negative.   Gastrointestinal: Negative.   Endocrine: Negative.   Genitourinary: Negative.   Musculoskeletal: Negative.   Skin: Negative.   Allergic/Immunologic: Negative.   Neurological: Negative.   Hematological: Negative.   Psychiatric/Behavioral: Negative.     Per HPI unless specifically indicated above     Objective:    BP 138/84 (BP Location: Left Arm, Patient Position: Sitting, Cuff Size: Large)   Pulse 78   Temp 98.4 F (36.9 C)   Ht 5' 2.7" (1.593 m)   Wt 233 lb (105.7 kg)   LMP  (LMP Unknown)   SpO2 96%   BMI 41.67 kg/m   Wt Readings from Last 3 Encounters:  05/24/16 233 lb (105.7 kg)  05/05/16 231 lb (104.8 kg)  03/19/16 232 lb 12.8 oz (105.6 kg)    Physical Exam  Constitutional: She is oriented to person, place, and time. She appears well-developed and well-nourished. No distress.  HENT:  Head: Normocephalic and atraumatic.  Eyes: Conjunctivae and lids are normal. Pupils are equal,  round, and reactive to light. Right eye exhibits no discharge. Left eye exhibits no discharge. No scleral icterus.  Neck: Normal range of motion. Neck supple. No JVD present. Carotid bruit is not present. No thyromegaly present.  Cardiovascular: Normal rate, regular rhythm and normal heart sounds.  Exam reveals no gallop and no friction rub.   No murmur heard. Pulmonary/Chest: Effort normal and breath sounds normal. No respiratory distress. She has no wheezes. She has no rales.  Abdominal: Soft. Normal appearance and bowel sounds are normal. There is no splenomegaly or hepatomegaly. There is no tenderness. There is no rebound.  Genitourinary: No breast swelling, tenderness or discharge.  Musculoskeletal: Normal range of motion.  Lymphadenopathy:    She has no cervical adenopathy.  Neurological: She is alert and oriented to person, place, and time.  Skin: Skin is warm, dry and intact. No rash noted. No pallor.  Psychiatric: She has a normal mood and affect. Her speech is normal and behavior is normal. Judgment and thought content normal. Cognition and memory are normal.    Results for orders placed or performed in visit on 12/08/15  Hepatitis C antibody  Result Value Ref Range   Hep C Virus Ab <0.1 0.0 - 0.9 s/co ratio  HIV antibody  Result Value Ref Range   HIV Screen 4th Generation wRfx Non Reactive  Non Reactive      Assessment & Plan:   Problem List Items Addressed This Visit      Unprioritized   Advance directive discussed with patient    Has advanced directives and does not want to make changes at this time      Hypertension    High here in the office.  Continue lifestyle modifications.  Monitor with home BP cuff      Osteopenia    Continue Evista      Routine general medical examination at a health care facility   Tendonitis of foot    Per Dr. Paulla Dolly       Other Visit Diagnoses    Need for pneumococcal vaccination    -  Primary   Relevant Orders   Pneumococcal  polysaccharide vaccine 23-valent greater than or equal to 2yo subcutaneous/IM (Completed)       Follow up plan: Return in about 1 year (around 05/24/2017).

## 2016-05-24 NOTE — Patient Instructions (Addendum)

## 2016-05-24 NOTE — Assessment & Plan Note (Signed)
Per Dr. Paulla Dolly

## 2016-05-24 NOTE — Assessment & Plan Note (Signed)
Discussed diet and exercise 

## 2016-05-24 NOTE — Assessment & Plan Note (Signed)
Has advanced directives and does not want to make changes at this time

## 2016-05-25 DIAGNOSIS — M9905 Segmental and somatic dysfunction of pelvic region: Secondary | ICD-10-CM | POA: Diagnosis not present

## 2016-05-25 DIAGNOSIS — M9903 Segmental and somatic dysfunction of lumbar region: Secondary | ICD-10-CM | POA: Diagnosis not present

## 2016-05-25 DIAGNOSIS — M5136 Other intervertebral disc degeneration, lumbar region: Secondary | ICD-10-CM | POA: Diagnosis not present

## 2016-05-25 DIAGNOSIS — M955 Acquired deformity of pelvis: Secondary | ICD-10-CM | POA: Diagnosis not present

## 2016-05-25 LAB — LIPID PANEL W/O CHOL/HDL RATIO
CHOLESTEROL TOTAL: 193 mg/dL (ref 100–199)
HDL: 57 mg/dL (ref >39)
LDL Calculated: 117 mg/dL — ABNORMAL HIGH (ref 0–99)
TRIGLYCERIDES: 93 mg/dL (ref 0–149)
VLDL Cholesterol Cal: 19 mg/dL (ref 5–40)

## 2016-05-25 LAB — COMPREHENSIVE METABOLIC PANEL
A/G RATIO: 2 (ref 1.2–2.2)
ALBUMIN: 4.4 g/dL (ref 3.6–4.8)
ALK PHOS: 85 IU/L (ref 39–117)
ALT: 16 IU/L (ref 0–32)
AST: 17 IU/L (ref 0–40)
BILIRUBIN TOTAL: 0.7 mg/dL (ref 0.0–1.2)
BUN/Creatinine Ratio: 19 (ref 12–28)
BUN: 17 mg/dL (ref 8–27)
CALCIUM: 9.7 mg/dL (ref 8.7–10.3)
CHLORIDE: 105 mmol/L (ref 96–106)
CO2: 25 mmol/L (ref 18–29)
CREATININE: 0.88 mg/dL (ref 0.57–1.00)
GFR calc Af Amer: 79 mL/min/{1.73_m2} (ref >59)
GFR calc non Af Amer: 69 mL/min/{1.73_m2} (ref >59)
Globulin, Total: 2.2 g/dL (ref 1.5–4.5)
Glucose: 95 mg/dL (ref 65–99)
Potassium: 4.5 mmol/L (ref 3.5–5.2)
SODIUM: 145 mmol/L — AB (ref 134–144)
TOTAL PROTEIN: 6.6 g/dL (ref 6.0–8.5)

## 2016-05-25 NOTE — Progress Notes (Signed)
Normal labs.  Pt notified through mychart

## 2016-05-31 ENCOUNTER — Ambulatory Visit
Admission: RE | Admit: 2016-05-31 | Discharge: 2016-05-31 | Disposition: A | Discharge status: Home / Self Care | Payer: PPO | Source: Ambulatory Visit | Attending: Unknown Physician Specialty | Admitting: Unknown Physician Specialty

## 2016-05-31 DIAGNOSIS — Z1231 Encounter for screening mammogram for malignant neoplasm of breast: Secondary | ICD-10-CM | POA: Insufficient documentation

## 2016-05-31 HISTORY — DX: Malignant (primary) neoplasm, unspecified: C80.1

## 2016-06-04 ENCOUNTER — Encounter: Payer: Self-pay | Admitting: Unknown Physician Specialty

## 2016-06-04 ENCOUNTER — Ambulatory Visit (INDEPENDENT_AMBULATORY_CARE_PROVIDER_SITE_OTHER): Payer: PPO | Admitting: Podiatry

## 2016-06-04 ENCOUNTER — Ambulatory Visit (INDEPENDENT_AMBULATORY_CARE_PROVIDER_SITE_OTHER): Payer: PPO | Admitting: Unknown Physician Specialty

## 2016-06-04 VITALS — BP 148/83 | HR 88 | Temp 98.6°F | Wt 234.6 lb

## 2016-06-04 DIAGNOSIS — J00 Acute nasopharyngitis [common cold]: Secondary | ICD-10-CM | POA: Diagnosis not present

## 2016-06-04 DIAGNOSIS — R05 Cough: Secondary | ICD-10-CM

## 2016-06-04 DIAGNOSIS — M779 Enthesopathy, unspecified: Secondary | ICD-10-CM | POA: Diagnosis not present

## 2016-06-04 DIAGNOSIS — R059 Cough, unspecified: Secondary | ICD-10-CM

## 2016-06-04 LAB — VERITOR FLU A/B WAIVED
INFLUENZA B: NEGATIVE
Influenza A: NEGATIVE

## 2016-06-04 MED ORDER — BENZONATATE 100 MG PO CAPS: 100.0000 mg | ORAL_CAPSULE | Freq: Two times a day (BID) | ORAL | 0 refills | Status: DC | PRN

## 2016-06-04 MED ORDER — AZITHROMYCIN 250 MG PO TABS: ORAL_TABLET | ORAL | 0 refills | Status: DC

## 2016-06-04 NOTE — Progress Notes (Signed)
BP (!) 148/83 (BP Location: Left Arm, Patient Position: Sitting, Cuff Size: Large)   Pulse 88   Temp 98.6 F (37 C)   Wt 234 lb 9.6 oz (106.4 kg)   LMP  (LMP Unknown)   SpO2 97%   BMI 41.96 kg/m    Subjective:    Patient ID: Heather James, female    DOB: 15-Dec-1950, 65 y.o.   MRN: [id number]  HPI: Heather James is a 65 y.o. female  Chief Complaint  Patient presents with  . URI    pt states she has congestio, pressure, and a cough that started 2 days ago   URI   This is a new problem. Episode onset: 48 hours. The problem has been rapidly worsening. There has been no fever. Associated symptoms include coughing and sinus pain. Pertinent negatives include no rhinorrhea or sore throat. Associated symptoms comments: Cough is worse symptom and coughs [place] a thick phlegm at times.  Throat feels "hot.". She has tried decongestant (Robitussin) for the symptoms. The treatment provided mild relief.     Relevant past medical, surgical, family and social history reviewed and updated as indicated. Interim medical history since our last visit reviewed. Allergies and medications reviewed and updated.  Review of Systems  HENT: Positive for sinus pain. Negative for rhinorrhea and sore throat.   Respiratory: Positive for cough.     Per HPI unless specifically indicated above     Objective:    BP (!) 148/83 (BP Location: Left Arm, Patient Position: Sitting, Cuff Size: Large)   Pulse 88   Temp 98.6 F (37 C)   Wt 234 lb 9.6 oz (106.4 kg)   LMP  (LMP Unknown)   SpO2 97%   BMI 41.96 kg/m   Wt Readings from Last 3 Encounters:  06/04/16 234 lb 9.6 oz (106.4 kg)  05/24/16 233 lb (105.7 kg)  05/05/16 231 lb (104.8 kg)    Physical Exam  Constitutional: She is oriented to person, place, and time. She appears well-developed and well-nourished. No distress.  HENT:  Head: Normocephalic and atraumatic.  Right Ear: Tympanic membrane and ear canal normal.  Left Ear: Tympanic membrane,  external ear and ear canal normal.  Nose: Rhinorrhea present. Right sinus exhibits no maxillary sinus tenderness and no frontal sinus tenderness. Left sinus exhibits no maxillary sinus tenderness and no frontal sinus tenderness.  Mouth/Throat: Mucous membranes are normal. Posterior oropharyngeal erythema present.  Right ear closed  Eyes: Conjunctivae and lids are normal. Right eye exhibits no discharge. Left eye exhibits no discharge. No scleral icterus.  Cardiovascular: Normal rate and regular rhythm.   Pulmonary/Chest: Effort normal and breath sounds normal. No respiratory distress.  Abdominal: Normal appearance. There is no splenomegaly or hepatomegaly.  Musculoskeletal: Normal range of motion.  Neurological: She is alert and oriented to person, place, and time.  Skin: Skin is intact. No rash noted. No pallor.  Psychiatric: She has a normal mood and affect. Her behavior is normal. Judgment and thought content normal.    Results for orders placed or performed in visit on 05/24/16  Comprehensive metabolic panel  Result Value Ref Range   Glucose 95 65 - 99 mg/dL   BUN 17 8 - 27 mg/dL   Creatinine, Ser 0.88 0.57 - 1.00 mg/dL   GFR calc non Af Amer 69 >59 mL/min/1.73   GFR calc Af Amer 79 >59 mL/min/1.73   BUN/Creatinine Ratio 19 12 - 28   Sodium 145 (H) 134 - 144 mmol/L  Potassium 4.5 3.5 - 5.2 mmol/L   Chloride 105 96 - 106 mmol/L   CO2 25 18 - 29 mmol/L   Calcium 9.7 8.7 - 10.3 mg/dL   Total Protein 6.6 6.0 - 8.5 g/dL   Albumin 4.4 3.6 - 4.8 g/dL   Globulin, Total 2.2 1.5 - 4.5 g/dL   Albumin/Globulin Ratio 2.0 1.2 - 2.2   Bilirubin Total 0.7 0.0 - 1.2 mg/dL   Alkaline Phosphatase 85 39 - 117 IU/L   AST 17 0 - 40 IU/L   ALT 16 0 - 32 IU/L  Lipid Panel w/o Chol/HDL Ratio  Result Value Ref Range   Cholesterol, Total 193 100 - 199 mg/dL   Triglycerides 93 0 - 149 mg/dL   HDL 57 >39 mg/dL   VLDL Cholesterol Cal 19 5 - 40 mg/dL   LDL Calculated 117 (H) 0 - 99 mg/dL        Assessment & Plan:   Problem List Items Addressed This Visit    None    Visit Diagnoses    Cough    -  Primary   Relevant Orders   Veritor Flu A/B Waived      Follow up plan: Return if symptoms worsen or fail to improve.

## 2016-06-06 NOTE — Progress Notes (Signed)
Subjective:     Patient ID: Heather James, female   DOB: 1951-05-07, 65 y.o.   MRN: [id number]  HPI patient states my right foot is doing really well with significant diminishment of discomfort   Review of Systems     Objective:   Physical Exam Neurovascular status intact muscle strength adequate with significant diminishment of discomfort in the lateral side of the right foot    Assessment:     Tendinitis lateral foot that's improving quite dramatically    Plan:     Advised on stretching exercises physical therapy continued brace usage and not going barefoot and reappoint if symptoms indicate

## 2016-06-08 ENCOUNTER — Telehealth: Payer: Self-pay | Admitting: Unknown Physician Specialty

## 2016-06-08 MED ORDER — HYDROCOD POLST-CPM POLST ER 10-8 MG/5ML PO SUER: 5.0000 mL | Freq: Two times a day (BID) | ORAL | 0 refills | Status: DC | PRN

## 2016-06-08 NOTE — Telephone Encounter (Signed)
Called and let patient know that Heather James wrote her a new prescription and that she needed to stop by and pick it up. Patient stated that she would pick it up right after lunch.

## 2016-06-08 NOTE — Telephone Encounter (Signed)
I wrote her Tussionex.  She will need to come and pick up

## 2016-06-08 NOTE — Telephone Encounter (Signed)
Called and spoke to patient. She stated that she is still coughing all day and all night long. She would like a different medication sent in for her cough.

## 2016-06-11 ENCOUNTER — Encounter: Payer: Self-pay | Admitting: Family Medicine

## 2016-06-11 ENCOUNTER — Ambulatory Visit (INDEPENDENT_AMBULATORY_CARE_PROVIDER_SITE_OTHER): Payer: PPO | Admitting: Family Medicine

## 2016-06-11 VITALS — BP 130/77 | HR 78 | Temp 98.5°F | Wt 232.0 lb

## 2016-06-11 DIAGNOSIS — J069 Acute upper respiratory infection, unspecified: Secondary | ICD-10-CM

## 2016-06-11 MED ORDER — PREDNISONE 20 MG PO TABS: 40.0000 mg | ORAL_TABLET | Freq: Every day | ORAL | 0 refills | Status: DC

## 2016-06-11 MED ORDER — AMOXICILLIN-POT CLAVULANATE 875-125 MG PO TABS: 1.0000 | ORAL_TABLET | Freq: Two times a day (BID) | ORAL | 0 refills | Status: DC

## 2016-06-11 MED ORDER — ALBUTEROL SULFATE HFA 108 (90 BASE) MCG/ACT IN AERS: 2.0000 | INHALATION_SPRAY | Freq: Four times a day (QID) | RESPIRATORY_TRACT | 0 refills | Status: DC | PRN

## 2016-06-11 NOTE — Progress Notes (Signed)
   BP 130/77 (BP Location: Left Arm, Patient Position: Sitting, Cuff Size: Normal)   Pulse 78   Temp 98.5 F (36.9 C)   Wt 232 lb (105.2 kg)   LMP  (LMP Unknown)   SpO2 98%   BMI 41.49 kg/m    Subjective:    Patient ID: Heather James, female    DOB: 1951/02/17, 65 y.o.   MRN: [id number]  HPI: Heather James is a 65 y.o. female  Chief Complaint  Patient presents with  . Cough    Productive. Yellow/brown in color.   . Nasal Congestion    Yellow.   . Wheezing   Patient presents with over a week of productive cough of thick yellow/brown sputum, congestion, wheezing, and intermittent SOB. Was seen 12/8 for sxs and given z-pak and tessalon perles with minimal relief. States she feels she is getting worse the last day or so. Denies fever, chills, aches, but does endorse fatigue since onset. No sick contacts.   Relevant past medical, surgical, family and social history reviewed and updated as indicated. Interim medical history since our last visit reviewed. Allergies and medications reviewed and updated.  Review of Systems  Constitutional: Positive for fatigue.  HENT: Positive for congestion.   Eyes: Negative.   Respiratory: Positive for cough, shortness of breath and wheezing.   Cardiovascular: Negative.   Gastrointestinal: Negative.   Genitourinary: Negative.   Musculoskeletal: Negative.   Skin: Negative.   Neurological: Negative.   Psychiatric/Behavioral: Negative.     Per HPI unless specifically indicated above     Objective:    BP 130/77 (BP Location: Left Arm, Patient Position: Sitting, Cuff Size: Normal)   Pulse 78   Temp 98.5 F (36.9 C)   Wt 232 lb (105.2 kg)   LMP  (LMP Unknown)   SpO2 98%   BMI 41.49 kg/m   Wt Readings from Last 3 Encounters:  06/11/16 232 lb (105.2 kg)  06/04/16 234 lb 9.6 oz (106.4 kg)  05/24/16 233 lb (105.7 kg)    Physical Exam  Constitutional: She is oriented to person, place, and time. She appears well-developed and  well-nourished. No distress.  HENT:  Head: Atraumatic.  Right Ear: External ear normal.  Left Ear: External ear normal.  Nose: Nose normal.  Oropharynx injected  Eyes: Conjunctivae are normal. Pupils are equal, round, and reactive to light.  Neck: Normal range of motion. Neck supple.  Cardiovascular: Normal rate and normal heart sounds.   Pulmonary/Chest: Effort normal. No respiratory distress. She has wheezes (moderate diffuse wheezes). She has no rales.  Musculoskeletal: Normal range of motion.  Neurological: She is alert and oriented to person, place, and time.  Skin: Skin is warm and dry.  Psychiatric: She has a normal mood and affect. Her behavior is normal.  Nursing note and vitals reviewed.     Assessment & Plan:   Problem List Items Addressed This Visit    None    Visit Diagnoses    Upper respiratory tract infection, unspecified type    -  Primary   Will treat with augmentin and prednisone given worsening course and presence of wheezing. Instructed on use of albuterol inhaler for as needed basis.        Follow up plan: Return in about 1 week (around 06/18/2016) for Lung recheck.

## 2016-06-18 ENCOUNTER — Encounter: Payer: Self-pay | Admitting: Family Medicine

## 2016-06-18 ENCOUNTER — Ambulatory Visit (INDEPENDENT_AMBULATORY_CARE_PROVIDER_SITE_OTHER): Payer: PPO | Admitting: Family Medicine

## 2016-06-18 VITALS — BP 126/83 | HR 83 | Temp 97.7°F | Wt 233.8 lb

## 2016-06-18 DIAGNOSIS — J069 Acute upper respiratory infection, unspecified: Secondary | ICD-10-CM | POA: Diagnosis not present

## 2016-06-18 NOTE — Progress Notes (Signed)
   BP 126/83 (BP Location: Left Arm, Patient Position: Sitting, Cuff Size: Large)   Pulse 83   Temp 97.7 F (36.5 C)   Wt 233 lb 12.8 oz (106.1 kg)   LMP  (LMP Unknown)   SpO2 98%   BMI 41.81 kg/m    Subjective:    Patient ID: Heather James, female    DOB: 1950/07/11, 65 y.o.   MRN: [id number]  HPI: Heather James is a 65 y.o. female  Chief Complaint  Patient presents with  . Lung Recheck    1 week f/up    Patient presents for 1 week lung recheck. Completed prednisone, has 1 day left on antibiotic. Feeling so much better. No lingering cough, wheezing, SOB. Still some fatigue when she gets really busy but had been fairly sedentary for several weeks with this illness so feels like she is just deconditioned. Denies fever, chills, sore throat, or N/V/D.   Relevant past medical, surgical, family and social history reviewed and updated as indicated. Interim medical history since our last visit reviewed. Allergies and medications reviewed and updated.  Review of Systems  Constitutional: Negative.   HENT: Negative.   Eyes: Negative.   Respiratory: Negative.   Cardiovascular: Negative.   Gastrointestinal: Negative.   Genitourinary: Negative.   Musculoskeletal: Negative.   Skin: Negative.   Neurological: Negative.   Psychiatric/Behavioral: Negative.     Per HPI unless specifically indicated above     Objective:    BP 126/83 (BP Location: Left Arm, Patient Position: Sitting, Cuff Size: Large)   Pulse 83   Temp 97.7 F (36.5 C)   Wt 233 lb 12.8 oz (106.1 kg)   LMP  (LMP Unknown)   SpO2 98%   BMI 41.81 kg/m   Wt Readings from Last 3 Encounters:  06/18/16 233 lb 12.8 oz (106.1 kg)  06/11/16 232 lb (105.2 kg)  06/04/16 234 lb 9.6 oz (106.4 kg)    Physical Exam  Constitutional: She is oriented to person, place, and time. She appears well-developed and well-nourished.  HENT:  Head: Atraumatic.  Right Ear: External ear normal.  Left Ear: External ear normal.    Nose: Nose normal.  Mouth/Throat: Oropharynx is clear and moist. No oropharyngeal exudate.  Eyes: Conjunctivae are normal. Pupils are equal, round, and reactive to light. No scleral icterus.  Neck: Normal range of motion. Neck supple.  Cardiovascular: Normal rate, regular rhythm and normal heart sounds.   Pulmonary/Chest: Effort normal and breath sounds normal. No respiratory distress. She has no wheezes. She has no rales.  Musculoskeletal: Normal range of motion.  Neurological: She is alert and oriented to person, place, and time.  Skin: Skin is warm and dry.  Psychiatric: She has a normal mood and affect. Her behavior is normal.  Nursing note and vitals reviewed.     Assessment & Plan:   Problem List Items Addressed This Visit    None    Visit Diagnoses    Upper respiratory tract infection, unspecified type    -  Primary   Resolved, no residual chest tightness or SOB. Slowly re-condition back to full activity. Follow up if begin to worsen again.        Follow up plan: Return if symptoms worsen or fail to improve.

## 2016-06-18 NOTE — Patient Instructions (Signed)
Follow up as needed

## 2016-06-22 DIAGNOSIS — M9905 Segmental and somatic dysfunction of pelvic region: Secondary | ICD-10-CM | POA: Diagnosis not present

## 2016-06-22 DIAGNOSIS — M9903 Segmental and somatic dysfunction of lumbar region: Secondary | ICD-10-CM | POA: Diagnosis not present

## 2016-06-22 DIAGNOSIS — M5136 Other intervertebral disc degeneration, lumbar region: Secondary | ICD-10-CM | POA: Diagnosis not present

## 2016-06-22 DIAGNOSIS — M955 Acquired deformity of pelvis: Secondary | ICD-10-CM | POA: Diagnosis not present

## 2016-07-19 ENCOUNTER — Encounter: Payer: Self-pay | Admitting: Podiatry

## 2016-07-19 ENCOUNTER — Ambulatory Visit (INDEPENDENT_AMBULATORY_CARE_PROVIDER_SITE_OTHER): Payer: Self-pay | Admitting: Podiatry

## 2016-07-19 DIAGNOSIS — M779 Enthesopathy, unspecified: Secondary | ICD-10-CM

## 2016-07-20 DIAGNOSIS — M9903 Segmental and somatic dysfunction of lumbar region: Secondary | ICD-10-CM | POA: Diagnosis not present

## 2016-07-20 DIAGNOSIS — M5136 Other intervertebral disc degeneration, lumbar region: Secondary | ICD-10-CM | POA: Diagnosis not present

## 2016-07-20 DIAGNOSIS — M9905 Segmental and somatic dysfunction of pelvic region: Secondary | ICD-10-CM | POA: Diagnosis not present

## 2016-07-20 DIAGNOSIS — M955 Acquired deformity of pelvis: Secondary | ICD-10-CM | POA: Diagnosis not present

## 2016-07-20 NOTE — Progress Notes (Signed)
Subjective:     Patient ID: Heather James, female   DOB: 1950/12/10, 66 y.o.   MRN: [id number]  HPI brace broke   Review of Systems     Objective:   Physical Exam Doing well    Assessment:     Tendinitis    Plan:     Brace dispensed

## 2016-08-03 DIAGNOSIS — H168 Other keratitis: Secondary | ICD-10-CM | POA: Diagnosis not present

## 2016-08-17 DIAGNOSIS — M9905 Segmental and somatic dysfunction of pelvic region: Secondary | ICD-10-CM | POA: Diagnosis not present

## 2016-08-17 DIAGNOSIS — M955 Acquired deformity of pelvis: Secondary | ICD-10-CM | POA: Diagnosis not present

## 2016-08-17 DIAGNOSIS — M5136 Other intervertebral disc degeneration, lumbar region: Secondary | ICD-10-CM | POA: Diagnosis not present

## 2016-08-17 DIAGNOSIS — M9903 Segmental and somatic dysfunction of lumbar region: Secondary | ICD-10-CM | POA: Diagnosis not present

## 2016-09-21 DIAGNOSIS — M5136 Other intervertebral disc degeneration, lumbar region: Secondary | ICD-10-CM | POA: Diagnosis not present

## 2016-09-21 DIAGNOSIS — M9903 Segmental and somatic dysfunction of lumbar region: Secondary | ICD-10-CM | POA: Diagnosis not present

## 2016-09-21 DIAGNOSIS — M9905 Segmental and somatic dysfunction of pelvic region: Secondary | ICD-10-CM | POA: Diagnosis not present

## 2016-09-21 DIAGNOSIS — M955 Acquired deformity of pelvis: Secondary | ICD-10-CM | POA: Diagnosis not present

## 2016-10-19 DIAGNOSIS — M955 Acquired deformity of pelvis: Secondary | ICD-10-CM | POA: Diagnosis not present

## 2016-10-19 DIAGNOSIS — M9905 Segmental and somatic dysfunction of pelvic region: Secondary | ICD-10-CM | POA: Diagnosis not present

## 2016-10-19 DIAGNOSIS — M9903 Segmental and somatic dysfunction of lumbar region: Secondary | ICD-10-CM | POA: Diagnosis not present

## 2016-10-19 DIAGNOSIS — M5136 Other intervertebral disc degeneration, lumbar region: Secondary | ICD-10-CM | POA: Diagnosis not present

## 2016-10-29 DIAGNOSIS — H9312 Tinnitus, left ear: Secondary | ICD-10-CM | POA: Diagnosis not present

## 2016-10-29 DIAGNOSIS — H8102 Meniere's disease, left ear: Secondary | ICD-10-CM | POA: Diagnosis not present

## 2016-10-29 DIAGNOSIS — H903 Sensorineural hearing loss, bilateral: Secondary | ICD-10-CM | POA: Diagnosis not present

## 2016-11-15 DIAGNOSIS — M9905 Segmental and somatic dysfunction of pelvic region: Secondary | ICD-10-CM | POA: Diagnosis not present

## 2016-11-15 DIAGNOSIS — M955 Acquired deformity of pelvis: Secondary | ICD-10-CM | POA: Diagnosis not present

## 2016-11-15 DIAGNOSIS — M9903 Segmental and somatic dysfunction of lumbar region: Secondary | ICD-10-CM | POA: Diagnosis not present

## 2016-11-15 DIAGNOSIS — M5136 Other intervertebral disc degeneration, lumbar region: Secondary | ICD-10-CM | POA: Diagnosis not present

## 2016-12-13 DIAGNOSIS — M9905 Segmental and somatic dysfunction of pelvic region: Secondary | ICD-10-CM | POA: Diagnosis not present

## 2016-12-13 DIAGNOSIS — D2271 Melanocytic nevi of right lower limb, including hip: Secondary | ICD-10-CM | POA: Diagnosis not present

## 2016-12-13 DIAGNOSIS — L820 Inflamed seborrheic keratosis: Secondary | ICD-10-CM | POA: Diagnosis not present

## 2016-12-13 DIAGNOSIS — M5136 Other intervertebral disc degeneration, lumbar region: Secondary | ICD-10-CM | POA: Diagnosis not present

## 2016-12-13 DIAGNOSIS — D2272 Melanocytic nevi of left lower limb, including hip: Secondary | ICD-10-CM | POA: Diagnosis not present

## 2016-12-13 DIAGNOSIS — M9903 Segmental and somatic dysfunction of lumbar region: Secondary | ICD-10-CM | POA: Diagnosis not present

## 2016-12-13 DIAGNOSIS — D225 Melanocytic nevi of trunk: Secondary | ICD-10-CM | POA: Diagnosis not present

## 2016-12-13 DIAGNOSIS — M955 Acquired deformity of pelvis: Secondary | ICD-10-CM | POA: Diagnosis not present

## 2016-12-13 DIAGNOSIS — D2261 Melanocytic nevi of right upper limb, including shoulder: Secondary | ICD-10-CM | POA: Diagnosis not present

## 2017-01-10 DIAGNOSIS — M5136 Other intervertebral disc degeneration, lumbar region: Secondary | ICD-10-CM | POA: Diagnosis not present

## 2017-01-10 DIAGNOSIS — M9903 Segmental and somatic dysfunction of lumbar region: Secondary | ICD-10-CM | POA: Diagnosis not present

## 2017-01-10 DIAGNOSIS — M9905 Segmental and somatic dysfunction of pelvic region: Secondary | ICD-10-CM | POA: Diagnosis not present

## 2017-01-10 DIAGNOSIS — M955 Acquired deformity of pelvis: Secondary | ICD-10-CM | POA: Diagnosis not present

## 2017-02-07 DIAGNOSIS — M9903 Segmental and somatic dysfunction of lumbar region: Secondary | ICD-10-CM | POA: Diagnosis not present

## 2017-02-07 DIAGNOSIS — M5136 Other intervertebral disc degeneration, lumbar region: Secondary | ICD-10-CM | POA: Diagnosis not present

## 2017-02-07 DIAGNOSIS — M9905 Segmental and somatic dysfunction of pelvic region: Secondary | ICD-10-CM | POA: Diagnosis not present

## 2017-02-07 DIAGNOSIS — M955 Acquired deformity of pelvis: Secondary | ICD-10-CM | POA: Diagnosis not present

## 2017-04-04 DIAGNOSIS — M5136 Other intervertebral disc degeneration, lumbar region: Secondary | ICD-10-CM | POA: Diagnosis not present

## 2017-04-04 DIAGNOSIS — M9905 Segmental and somatic dysfunction of pelvic region: Secondary | ICD-10-CM | POA: Diagnosis not present

## 2017-04-04 DIAGNOSIS — M955 Acquired deformity of pelvis: Secondary | ICD-10-CM | POA: Diagnosis not present

## 2017-04-04 DIAGNOSIS — M9903 Segmental and somatic dysfunction of lumbar region: Secondary | ICD-10-CM | POA: Diagnosis not present

## 2017-04-22 ENCOUNTER — Other Ambulatory Visit: Payer: Self-pay | Admitting: Unknown Physician Specialty

## 2017-04-22 DIAGNOSIS — Z1231 Encounter for screening mammogram for malignant neoplasm of breast: Secondary | ICD-10-CM

## 2017-05-02 DIAGNOSIS — M5136 Other intervertebral disc degeneration, lumbar region: Secondary | ICD-10-CM | POA: Diagnosis not present

## 2017-05-02 DIAGNOSIS — M9903 Segmental and somatic dysfunction of lumbar region: Secondary | ICD-10-CM | POA: Diagnosis not present

## 2017-05-02 DIAGNOSIS — M9905 Segmental and somatic dysfunction of pelvic region: Secondary | ICD-10-CM | POA: Diagnosis not present

## 2017-05-02 DIAGNOSIS — M955 Acquired deformity of pelvis: Secondary | ICD-10-CM | POA: Diagnosis not present

## 2017-05-16 DIAGNOSIS — H903 Sensorineural hearing loss, bilateral: Secondary | ICD-10-CM | POA: Diagnosis not present

## 2017-05-27 ENCOUNTER — Ambulatory Visit (INDEPENDENT_AMBULATORY_CARE_PROVIDER_SITE_OTHER): Payer: PPO

## 2017-05-27 VITALS — BP 122/77 | HR 67 | Temp 98.2°F | Resp 17 | Ht 63.0 in | Wt 230.8 lb

## 2017-05-27 DIAGNOSIS — Z6841 Body Mass Index (BMI) 40.0 and over, adult: Secondary | ICD-10-CM | POA: Diagnosis not present

## 2017-05-27 DIAGNOSIS — I10 Essential (primary) hypertension: Secondary | ICD-10-CM

## 2017-05-27 DIAGNOSIS — Z0000 Encounter for general adult medical examination without abnormal findings: Secondary | ICD-10-CM | POA: Diagnosis not present

## 2017-05-27 NOTE — Patient Instructions (Addendum)
Heather James , Thank you for taking time to come for your Medicare Wellness Visit. I appreciate your ongoing commitment to your health goals. Please review the following plan we discussed and let me know if I can assist you in the future.   Screening recommendations/referrals: Colonoscopy: completed 11/22/2012 Mammogram: completed 05/31/2016 Bone Density:  Completed 05/13/2015 Recommended yearly ophthalmology/optometry visit for glaucoma screening and checkup Recommended yearly dental visit for hygiene and checkup  Vaccinations: Influenza vaccine: up to date  Pneumococcal vaccine: up to date Tdap vaccine: up to date  Shingles vaccine: up to date    Advanced directives: Please bring a copy of your health care power of attorney and living will to the office at your convenience.  Conditions/risks identified: Recommend drinking at least 5-6 glasses of water a day   Next appointment: Follow up on 05/30/2017 at 10:00am with Regino Schultze. Follow up in one year for your annual wellness exam.    Preventive Care 65 Years and Older, Female Preventive care refers to lifestyle choices and visits with your health care provider that can promote health and wellness. What does preventive care include?  A yearly physical exam. This is also called an annual well check.  Dental exams once or twice a year.  Routine eye exams. Ask your health care provider how often you should have your eyes checked.  Personal lifestyle choices, including:  Daily care of your teeth and gums.  Regular physical activity.  Eating a healthy diet.  Avoiding tobacco and drug use.  Limiting alcohol use.  Practicing safe sex.  Taking low-dose aspirin every day.  Taking vitamin and mineral supplements as recommended by your health care provider. What happens during an annual well check? The services and screenings done by your health care provider during your annual well check will depend on your age, overall health,  lifestyle risk factors, and family history of disease. Counseling  Your health care provider may ask you questions about your:  Alcohol use.  Tobacco use.  Drug use.  Emotional well-being.  Home and relationship well-being.  Sexual activity.  Eating habits.  History of falls.  Memory and ability to understand (cognition).  Work and work Statistician.  Reproductive health. Screening  You may have the following tests or measurements:  Height, weight, and BMI.  Blood pressure.  Lipid and cholesterol levels. These may be checked every 5 years, or more frequently if you are over 36 years old.  Skin check.  Lung cancer screening. You may have this screening every year starting at age 63 if you have a 30-pack-year history of smoking and currently smoke or have quit within the past 15 years.  Fecal occult blood test (FOBT) of the stool. You may have this test every year starting at age 45.  Flexible sigmoidoscopy or colonoscopy. You may have a sigmoidoscopy every 5 years or a colonoscopy every 10 years starting at age 54.  Hepatitis C blood test.  Hepatitis B blood test.  Sexually transmitted disease (STD) testing.  Diabetes screening. This is done by checking your blood sugar (glucose) after you have not eaten for a while (fasting). You may have this done every 1-3 years.  Bone density scan. This is done to screen for osteoporosis. You may have this done starting at age 23.  Mammogram. This may be done every 1-2 years. Talk to your health care provider about how often you should have regular mammograms. Talk with your health care provider about your test results, treatment options, and  if necessary, the need for more tests. Vaccines  Your health care provider may recommend certain vaccines, such as:  Influenza vaccine. This is recommended every year.  Tetanus, diphtheria, and acellular pertussis (Tdap, Td) vaccine. You may need a Td booster every 10 years.  Zoster  vaccine. You may need this after age 60.  Pneumococcal 13-valent conjugate (PCV13) vaccine. One dose is recommended after age 9.  Pneumococcal polysaccharide (PPSV23) vaccine. One dose is recommended after age 73. Talk to your health care provider about which screenings and vaccines you need and how often you need them. This information is not intended to replace advice given to you by your health care provider. Make sure you discuss any questions you have with your health care provider. Document Released: 07/11/2015 Document Revised: 03/03/2016 Document Reviewed: 04/15/2015 Elsevier Interactive Patient Education  2017 [place] Prevention in the Home Falls can cause injuries. They can happen to people of all ages. There are many things you can do to make your home safe and to help prevent falls. What can I do on the outside of my home?  Regularly fix the edges of walkways and driveways and fix any cracks.  Remove anything that might make you trip as you walk through a door, such as a raised step or threshold.  Trim any bushes or trees on the path to your home.  Use bright outdoor lighting.  Clear any walking paths of anything that might make someone trip, such as rocks or tools.  Regularly check to see if handrails are loose or broken. Make sure that both sides of any steps have handrails.  Any raised decks and porches should have guardrails on the edges.  Have any leaves, snow, or ice cleared regularly.  Use sand or salt on walking paths during winter.  Clean up any spills in your garage right away. This includes oil or grease spills. What can I do in the bathroom?  Use night lights.  Install grab bars by the toilet and in the tub and shower. Do not use towel bars as grab bars.  Use non-skid mats or decals in the tub or shower.  If you need to sit down in the shower, use a plastic, non-slip stool.  Keep the floor dry. Clean up any water that spills on the  floor as soon as it happens.  Remove soap buildup in the tub or shower regularly.  Attach bath mats securely with double-sided non-slip rug tape.  Do not have throw rugs and other things on the floor that can make you trip. What can I do in the bedroom?  Use night lights.  Make sure that you have a light by your bed that is easy to reach.  Do not use any sheets or blankets that are too big for your bed. They should not hang down onto the floor.  Have a firm chair that has side arms. You can use this for support while you get dressed.  Do not have throw rugs and other things on the floor that can make you trip. What can I do in the kitchen?  Clean up any spills right away.  Avoid walking on wet floors.  Keep items that you use a lot in easy-to-reach places.  If you need to reach something above you, use a strong step stool that has a grab bar.  Keep electrical cords out of the way.  Do not use floor polish or wax that makes floors slippery. If you  must use wax, use non-skid floor wax.  Do not have throw rugs and other things on the floor that can make you trip. What can I do with my stairs?  Do not leave any items on the stairs.  Make sure that there are handrails on both sides of the stairs and use them. Fix handrails that are broken or loose. Make sure that handrails are as long as the stairways.  Check any carpeting to make sure that it is firmly attached to the stairs. Fix any carpet that is loose or worn.  Avoid having throw rugs at the top or bottom of the stairs. If you do have throw rugs, attach them to the floor with carpet tape.  Make sure that you have a light switch at the top of the stairs and the bottom of the stairs. If you do not have them, ask someone to add them for you. What else can I do to help prevent falls?  Wear shoes that:  Do not have high heels.  Have rubber bottoms.  Are comfortable and fit you well.  Are closed at the toe. Do not wear  sandals.  If you use a stepladder:  Make sure that it is fully opened. Do not climb a closed stepladder.  Make sure that both sides of the stepladder are locked into place.  Ask someone to hold it for you, if possible.  Clearly mark and make sure that you can see:  Any grab bars or handrails.  First and last steps.  Where the edge of each step is.  Use tools that help you move around (mobility aids) if they are needed. These include:  Canes.  Walkers.  Scooters.  Crutches.  Turn on the lights when you go into a dark area. Replace any light bulbs as soon as they burn out.  Set up your furniture so you have a clear path. Avoid moving your furniture around.  If any of your floors are uneven, fix them.  If there are any pets around you, be aware of where they are.  Review your medicines with your doctor. Some medicines can make you feel dizzy. This can increase your chance of falling. Ask your doctor what other things that you can do to help prevent falls. This information is not intended to replace advice given to you by your health care provider. Make sure you discuss any questions you have with your health care provider. Document Released: 04/10/2009 Document Revised: 11/20/2015 Document Reviewed: 07/19/2014 Elsevier Interactive Patient Education  2017 [employer].

## 2017-05-27 NOTE — Progress Notes (Signed)
Subjective:   Heather James is a 66 y.o. female who presents for Medicare Annual (Subsequent) preventive examination.  Review of Systems:  Cardiac Risk Factors include: hypertension;obesity (BMI >30kg/m2);advanced age (>57men, >9 women)     Objective:     Vitals: BP 122/77 (BP Location: Left Arm, Patient Position: Sitting)   Pulse 67   Temp 98.2 F (36.8 C) (Oral)   Resp 17   Ht 5\' 3"  (1.6 m)   Wt 230 lb 12.8 oz (104.7 kg)   LMP  (LMP Unknown)   BMI 40.88 kg/m   Body mass index is 40.88 kg/m.  Advanced Directives 05/27/2017 05/24/2016 05/23/2015 05/23/2015  Does Patient Have a Medical Advance Directive? Yes Yes No No  Type of Paramedic of [place];Living will - -  Copy of [place] in Chart? No - copy requested - - -  Would patient like information on creating a medical advance directive? - - No - patient declined information -    Tobacco Social History   Tobacco Use  Smoking Status Never Smoker  Smokeless Tobacco Never Used     Counseling given: Not Answered   Clinical Intake:  Pre-visit preparation completed: Yes  Pain : No/denies pain     Nutritional Status: BMI > 30  Obese Nutritional Risks: Other (Comment) Diabetes: No  Activities of Daily Living: Independent Ambulation: Independent with device- listed below Home Assistive Devices/Equipment: Eyeglasses Medication Administration: Independent Home Management: Independent  Barriers to Care Management & Learning: None  Do you feel unsafe in your current relationship?: No(single) Do you feel physically threatened by others?: No Anyone hurting you at home, work, or school?: No Unable to ask?: No  How often do you need to have someone help you when you read instructions, pamphlets, or other written materials from your doctor or pharmacy?: 1 - Never What is the last grade level you completed in school?: 12th  grade  Interpreter Needed?: No  Information entered by :: Karita Dralle,LPN   Past Medical History:  Diagnosis Date  . Cancer ([place])    skin ca on leg  . CSF leak from ear   . Hypertension   . Obesity   . Osteopenia    Past Surgical History:  Procedure Laterality Date  . BREAST BIOPSY Left 11/21/96   neg  . BREAST SURGERY Left    cyst removed  . FACIAL NERVE SURGERY    . PILONIDAL CYST EXCISION    . SKIN CANCER EXCISION Right    right leg, April 2017   Family History  Problem Relation Age of Onset  . Heart disease Mother   . Diabetes Mother   . Hypertension Mother   . Breast cancer Mother 34  . Heart disease Father   . Diabetes Father   . Hypertension Brother   . Cancer Maternal Grandmother        spine  . Heart disease Maternal Grandfather   . Heart disease Paternal Grandmother   . Emphysema Paternal Grandfather    Social History   Socioeconomic History  . Marital status: Divorced    Spouse name: None  . Number of children: None  . Years of education: 31  . Highest education level: 12th grade  Social Needs  . Financial resource strain: Not hard at all  . Food insecurity - worry: Never true  . Food insecurity - inability: Never true  . Transportation needs - medical: No  . Transportation needs -  non-medical: No  Occupational History  . None  Tobacco Use  . Smoking status: Never Smoker  . Smokeless tobacco: Never Used  Substance and Sexual Activity  . Alcohol use: No    Alcohol/week: 0.0 oz  . Drug use: No  . Sexual activity: No  Other Topics Concern  . None  Social History Narrative  . None    Outpatient Encounter Medications as of 05/27/2017  Medication Sig  . albuterol (PROVENTIL HFA;VENTOLIN HFA) 108 (90 Base) MCG/ACT inhaler Inhale 2 puffs into the lungs every 6 (six) hours as needed for wheezing or shortness of breath.  Marland Kitchen aspirin 81 MG tablet Take 81 mg by mouth daily.  . mometasone (NASONEX) 50 MCG/ACT nasal spray Place 2 sprays into the  nose daily.  . raloxifene (EVISTA) 60 MG tablet Take 1 tablet (60 mg total) by mouth daily.  . chlorpheniramine-HYDROcodone (TUSSIONEX PENNKINETIC ER) 10-8 MG/5ML SUER Take 5 mLs by mouth every 12 (twelve) hours as needed for cough. (Patient not taking: Reported on 05/27/2017)  . [DISCONTINUED] amoxicillin-clavulanate (AUGMENTIN) 875-125 MG tablet Take 1 tablet by mouth 2 (two) times daily. (Patient not taking: Reported on 05/27/2017)   No facility-administered encounter medications on file as of 05/27/2017.     Activities of Daily Living In your present state of health, do you have any difficulty performing the following activities: 05/27/2017  Hearing? Y  Comment has seen hearing specialist   Vision? N  Difficulty concentrating or making decisions? N  Walking or climbing stairs? N  Dressing or bathing? N  Doing errands, shopping? N  Preparing Food and eating ? N  Using the Toilet? N  In the past six months, have you accidently leaked urine? N  Do you have problems with loss of bowel control? N  Managing your Medications? N  Managing your Finances? N  Housekeeping or managing your Housekeeping? N  Some recent data might be hidden    Timed Get Up and Go performed: Yes, completed in 5 seconds  Patient Care Team: Kathrine Haddock, NP as PCP - General (Nurse Practitioner)    Assessment:     Exercise Activities and Dietary recommendations Current Exercise Habits: Home exercise routine, Type of exercise: walking, Time (Minutes): 45, Frequency (Times/Week): 2, Weekly Exercise (Minutes/Week): 90, Intensity: Mild, Exercise limited by: None identified  Goals    . DIET - INCREASE WATER INTAKE     Recommend drinking at least 5-6 glasses of water a day       Fall Risk Fall Risk  05/27/2017 05/24/2016 05/23/2015  Falls in the past year? No No No   Is the patient's home free of loose throw rugs in walkways, pet beds, electrical cords, etc?   yes      Grab bars in the bathroom? no       Handrails on the stairs?  No stairs in home.       Adequate lighting?   yes  Depression Screen PHQ 2/9 Scores 05/27/2017 05/24/2016 05/23/2015  PHQ - 2 Score 0 0 0  PHQ- 9 Score - 0 -     Cognitive Function     6CIT Screen 05/27/2017  What Year? 0 points  What month? 0 points  What time? 0 points  Count back from 20 0 points  Months in reverse 0 points  Repeat phrase 0 points  Total Score 0    Immunization History  Administered Date(s) Administered  . Influenza-Unspecified 03/29/2016, 04/06/2017  . Pneumococcal Conjugate-13 05/23/2015  . Pneumococcal Polysaccharide-23 05/24/2016  .  Tdap 04/03/2013  . Zoster 04/03/2013   Screening Tests Health Maintenance  Topic Date Due  . COLONOSCOPY  11/14/2017  . MAMMOGRAM  05/31/2018  . TETANUS/TDAP  04/04/2023  . INFLUENZA VACCINE  Completed  . DEXA SCAN  Completed  . Hepatitis C Screening  Completed  . PNA vac Low Risk Adult  Completed   Cancer Screenings: Lung:  Low Dose CT Chest recommended if Age 74-80 years, 30 pack-year currently smoking OR have quit w/in 15years. Patient does not qualify. Breast:  Up to date on Mammogram? Yes, next one is scheduled for 06/06/2017  Up to date of Bone Density/Dexa? Yes Colorectal: 11/22/2012  Additional Screenings:  Hepatitis B/HIV/Syphillis: HIV completed 12/08/2015, Hep B and Syphillis not indicated  Hepatitis C Screening: completed 12/08/2015     Plan:    I have personally reviewed and addressed the Medicare Annual Wellness questionnaire and have noted the following in the patient's chart:  A. Medical and social history B. Use of alcohol, tobacco or illicit drugs  C. Current medications and supplements D. Functional ability and status E.  Nutritional status F.  Physical activity G. Advance directives H. List of other physicians I.  Hospitalizations, surgeries, and ER visits in previous 12 months J.  [place] such as hearing and vision if needed, cognitive and  depression L. Referrals and appointments   In addition, I have reviewed and discussed with patient certain preventive protocols, quality metrics, and best practice recommendations. A written personalized care plan for preventive services as well as general preventive health recommendations were provided to patient.   Signed,  Tyler Aas, LPN Nurse Health Advisor   Nurse Notes: none

## 2017-05-28 LAB — COMPREHENSIVE METABOLIC PANEL
ALBUMIN: 4.3 g/dL (ref 3.6–4.8)
ALK PHOS: 82 IU/L (ref 39–117)
ALT: 18 IU/L (ref 0–32)
AST: 20 IU/L (ref 0–40)
Albumin/Globulin Ratio: 1.8 (ref 1.2–2.2)
BUN / CREAT RATIO: 21 (ref 12–28)
BUN: 19 mg/dL (ref 8–27)
Bilirubin Total: 0.5 mg/dL (ref 0.0–1.2)
CO2: 28 mmol/L (ref 20–29)
CREATININE: 0.9 mg/dL (ref 0.57–1.00)
Calcium: 9.7 mg/dL (ref 8.7–10.3)
Chloride: 106 mmol/L (ref 96–106)
GFR, EST AFRICAN AMERICAN: 77 mL/min/{1.73_m2} (ref >59)
GFR, EST NON AFRICAN AMERICAN: 66 mL/min/{1.73_m2} (ref >59)
GLOBULIN, TOTAL: 2.4 g/dL (ref 1.5–4.5)
Glucose: 95 mg/dL (ref 65–99)
Potassium: 4.3 mmol/L (ref 3.5–5.2)
SODIUM: 147 mmol/L — AB (ref 134–144)
TOTAL PROTEIN: 6.7 g/dL (ref 6.0–8.5)

## 2017-05-30 ENCOUNTER — Ambulatory Visit (INDEPENDENT_AMBULATORY_CARE_PROVIDER_SITE_OTHER): Payer: PPO | Admitting: Unknown Physician Specialty

## 2017-05-30 ENCOUNTER — Encounter: Payer: Self-pay | Admitting: Unknown Physician Specialty

## 2017-05-30 VITALS — BP 123/77 | HR 66 | Temp 98.7°F | Ht 63.0 in | Wt 230.4 lb

## 2017-05-30 DIAGNOSIS — I10 Essential (primary) hypertension: Secondary | ICD-10-CM

## 2017-05-30 DIAGNOSIS — Z6841 Body Mass Index (BMI) 40.0 and over, adult: Secondary | ICD-10-CM

## 2017-05-30 DIAGNOSIS — Z0000 Encounter for general adult medical examination without abnormal findings: Secondary | ICD-10-CM

## 2017-05-30 DIAGNOSIS — Z7189 Other specified counseling: Secondary | ICD-10-CM | POA: Diagnosis not present

## 2017-05-30 DIAGNOSIS — J3489 Other specified disorders of nose and nasal sinuses: Secondary | ICD-10-CM

## 2017-05-30 DIAGNOSIS — M8580 Other specified disorders of bone density and structure, unspecified site: Secondary | ICD-10-CM

## 2017-05-30 HISTORY — DX: Other specified counseling: Z71.89

## 2017-05-30 MED ORDER — MOMETASONE FUROATE 50 MCG/ACT NA SUSP: 2.0000 | Freq: Every day | NASAL | 12 refills | Status: DC

## 2017-05-30 MED ORDER — RALOXIFENE HCL 60 MG PO TABS: 60.0000 mg | ORAL_TABLET | Freq: Every day | ORAL | 3 refills | Status: DC

## 2017-05-30 MED ORDER — HYDROCHLOROTHIAZIDE 50 MG PO TABS: 50.0000 mg | ORAL_TABLET | ORAL | 3 refills | Status: DC

## 2017-05-30 NOTE — Progress Notes (Signed)
BP 123/77   Pulse 66   Temp 98.7 F (37.1 C) (Oral)   Ht 5' 3"  (1.6 m)   Wt 230 lb 6.4 oz (104.5 kg)   LMP  (LMP Unknown)   SpO2 98%   BMI 40.81 kg/m    Subjective:    Patient ID: Heather James, female    DOB: [birth year], 66 y.o.   MRN: [id number]  HPI: Heather James is a 66 y.o. female  Chief Complaint  Patient presents with  . Annual Exam    pt had wellness exam 05/27/17 with [facility]   Hypertension Using medications without difficulty Average home BPs 120-125/70-72   No problems or lightheadedness No chest pain with exertion or shortness of breath No Edema   Hyperlipidemia Using medications without problems: No Muscle aches  Diet compliance:Exercise: "It could be better"  Need  Asthma Occasional use when she wheezes.  As active as she wants to be  Reviewed labs and lipid was not done.  Will come back to get it done  Social History   Socioeconomic History  . Marital status: Divorced    Spouse name: Not on file  . Number of children: Not on file  . Years of education: 42  . Highest education level: 12th grade  Social Needs  . Financial resource strain: Not hard at all  . Food insecurity - worry: Never true  . Food insecurity - inability: Never true  . Transportation needs - medical: No  . Transportation needs - non-medical: No  Occupational History  . Not on file  Tobacco Use  . Smoking status: Never Smoker  . Smokeless tobacco: Never Used  Substance and Sexual Activity  . Alcohol use: No    Alcohol/week: 0.0 oz  . Drug use: No  . Sexual activity: No  Other Topics Concern  . Not on file  Social History Narrative  . Not on file   Family History  Problem Relation Age of Onset  . Heart disease Mother   . Diabetes Mother   . Hypertension Mother   . Breast cancer Mother 83  . Heart disease Father   . Diabetes Father   . Hypertension Brother   . Cancer Maternal Grandmother        spine  . Heart disease Maternal Grandfather   . Heart  disease Paternal Grandmother   . Emphysema Paternal Grandfather    Past Medical History:  Diagnosis Date  . Cancer ([place])    skin ca on leg  . CSF leak from ear   . Hypertension   . Obesity   . Osteopenia    Past Surgical History:  Procedure Laterality Date  . BREAST BIOPSY Left 11/21/96   neg  . BREAST SURGERY Left    cyst removed  . FACIAL NERVE SURGERY    . PILONIDAL CYST EXCISION    . SKIN CANCER EXCISION Right    right leg, April 2017     Relevant past medical, surgical, family and social history reviewed and updated as indicated. Interim medical history since our last visit reviewed. Allergies and medications reviewed and updated.  Review of Systems  Constitutional: Negative.   HENT: Positive for sinus pain.   Eyes: Negative.   Respiratory: Negative.   Cardiovascular: Negative.   Gastrointestinal: Negative.   Endocrine: Negative.   Genitourinary: Negative.   Musculoskeletal: Negative.   Skin: Negative.   Allergic/Immunologic: Negative.   Neurological: Negative.   Hematological: Negative.   Psychiatric/Behavioral: Negative.  Per HPI unless specifically indicated above     Objective:    BP 123/77   Pulse 66   Temp 98.7 F (37.1 C) (Oral)   Ht 5' 3"  (1.6 m)   Wt 230 lb 6.4 oz (104.5 kg)   LMP  (LMP Unknown)   SpO2 98%   BMI 40.81 kg/m   Wt Readings from Last 3 Encounters:  05/30/17 230 lb 6.4 oz (104.5 kg)  05/27/17 230 lb 12.8 oz (104.7 kg)  06/18/16 233 lb 12.8 oz (106.1 kg)    Physical Exam  Constitutional: She is oriented to person, place, and time. She appears well-developed and well-nourished. No distress.  HENT:  Head: Normocephalic and atraumatic.  Eyes: Conjunctivae and lids are normal. Right eye exhibits no discharge. Left eye exhibits no discharge. No scleral icterus.  Neck: Normal range of motion. Neck supple. No JVD present. Carotid bruit is not present.  Cardiovascular: Normal rate, regular rhythm and normal heart sounds.    Pulmonary/Chest: Effort normal and breath sounds normal.  Abdominal: Normal appearance. There is no splenomegaly or hepatomegaly.  Musculoskeletal: Normal range of motion.  Neurological: She is alert and oriented to person, place, and time.  Skin: Skin is warm, dry and intact. No rash noted. No pallor.  Psychiatric: She has a normal mood and affect. Her behavior is normal. Judgment and thought content normal.    Results for orders placed or performed in visit on 05/27/17  Comp Met (CMET)  Result Value Ref Range   Glucose 95 65 - 99 mg/dL   BUN 19 8 - 27 mg/dL   Creatinine, Ser 0.90 0.57 - 1.00 mg/dL   GFR calc non Af Amer 66 >59 mL/min/1.73   GFR calc Af Amer 77 >59 mL/min/1.73   BUN/Creatinine Ratio 21 12 - 28   Sodium 147 (H) 134 - 144 mmol/L   Potassium 4.3 3.5 - 5.2 mmol/L   Chloride 106 96 - 106 mmol/L   CO2 28 20 - 29 mmol/L   Calcium 9.7 8.7 - 10.3 mg/dL   Total Protein 6.7 6.0 - 8.5 g/dL   Albumin 4.3 3.6 - 4.8 g/dL   Globulin, Total 2.4 1.5 - 4.5 g/dL   Albumin/Globulin Ratio 1.8 1.2 - 2.2   Bilirubin Total 0.5 0.0 - 1.2 mg/dL   Alkaline Phosphatase 82 39 - 117 IU/L   AST 20 0 - 40 IU/L   ALT 18 0 - 32 IU/L      Assessment & Plan:   Problem List Items Addressed This Visit      Unprioritized   Advanced care planning/counseling discussion    A voluntary discussion about advance care planning including the explanation and discussion of advance directives was extensively discussed  with the patient.  Explanation about the health care proxy and Living will was reviewed.   During this discussion, the patient was able to identify a health care proxy as her brother Jolyn Nap and plans to update forms.  Planning visit for further assistance with forms with the lawyer and will bring in copies        Hypertension    Stable, continue present medications.        Relevant Medications   hydrochlorothiazide (HYDRODIURIL) 50 MG tablet   Obesity    Discussed diet and  exercise      Osteopenia    Order dexa scan.  Continue Evista      Relevant Orders   DG Bone Density   Sinus pressure  New problem.  Wonders what to try.  Recommended Nasonex       Other Visit Diagnoses    Annual physical exam    -  Primary       Follow up plan: Return in about 6 months (around 11/28/2017).

## 2017-05-30 NOTE — Patient Instructions (Addendum)
Please do call to schedule your bone density; the number to schedule one at either [facility] or [facility] is [phone]     Preventive Care 65 Years and Older, Female Preventive care refers to lifestyle choices and visits with your health care provider that can promote health and wellness. What does preventive care include?  A yearly physical exam. This is also called an annual well check.  Dental exams once or twice a year.  Routine eye exams. Ask your health care provider how often you should have your eyes checked.  Personal lifestyle choices, including: ? Daily care of your teeth and gums. ? Regular physical activity. ? Eating a healthy diet. ? Avoiding tobacco and drug use. ? Limiting alcohol use. ? Practicing safe sex. ? Taking low-dose aspirin every day. ? Taking vitamin and mineral supplements as recommended by your health care provider. What happens during an annual well check? The services and screenings done by your health care provider during your annual well check will depend on your age, overall health, lifestyle risk factors, and family history of disease. Counseling Your health care provider may ask you questions about your:  Alcohol use.  Tobacco use.  Drug use.  Emotional well-being.  Home and relationship well-being.  Sexual activity.  Eating habits.  History of falls.  Memory and ability to understand (cognition).  Work and work Statistician.  Reproductive health.  Screening You may have the following tests or measurements:  Height, weight, and BMI.  Blood pressure.  Lipid and cholesterol levels. These may be checked every 5 years, or more frequently if you are over 82 years old.  Skin check.  Lung cancer screening. You may have this screening every year starting at age 58 if you have a 30-pack-year history of smoking and currently smoke or have quit within the past 15 years.  Fecal occult blood test  (FOBT) of the stool. You may have this test every year starting at age 80.  Flexible sigmoidoscopy or colonoscopy. You may have a sigmoidoscopy every 5 years or a colonoscopy every 10 years starting at age 67.  Hepatitis C blood test.  Hepatitis B blood test.  Sexually transmitted disease (STD) testing.  Diabetes screening. This is done by checking your blood sugar (glucose) after you have not eaten for a while (fasting). You may have this done every 1-3 years.  Bone density scan. This is done to screen for osteoporosis. You may have this done starting at age 30.  Mammogram. This may be done every 1-2 years. Talk to your health care provider about how often you should have regular mammograms.  Talk with your health care provider about your test results, treatment options, and if necessary, the need for more tests. Vaccines Your health care provider may recommend certain vaccines, such as:  Influenza vaccine. This is recommended every year.  Tetanus, diphtheria, and acellular pertussis (Tdap, Td) vaccine. You may need a Td booster every 10 years.  Varicella vaccine. You may need this if you have not been vaccinated.  Zoster vaccine. You may need this after age 66.  Measles, mumps, and rubella (MMR) vaccine. You may need at least one dose of MMR if you were born in 1957 or later. You may also need a second dose.  Pneumococcal 13-valent conjugate (PCV13) vaccine. One dose is recommended after age 95.  Pneumococcal polysaccharide (PPSV23) vaccine. One dose is recommended after age 81.  Meningococcal vaccine. You may need this if you have certain  conditions.  Hepatitis A vaccine. You may need this if you have certain conditions or if you travel or work in places where you may be exposed to hepatitis A.  Hepatitis B vaccine. You may need this if you have certain conditions or if you travel or work in places where you may be exposed to hepatitis B.  Haemophilus influenzae type b  (Hib) vaccine. You may need this if you have certain conditions.  Talk to your health care provider about which screenings and vaccines you need and how often you need them. This information is not intended to replace advice given to you by your health care provider. Make sure you discuss any questions you have with your health care provider. Document Released: 07/11/2015 Document Revised: 03/03/2016 Document Reviewed: 04/15/2015 Elsevier Interactive Patient Education  2017 [employer].

## 2017-05-30 NOTE — Assessment & Plan Note (Signed)
Stable, continue present medications.   

## 2017-05-30 NOTE — Assessment & Plan Note (Signed)
Discussed diet and exercise 

## 2017-05-30 NOTE — Assessment & Plan Note (Signed)
New problem.  Wonders what to try.  Recommended Nasonex

## 2017-05-30 NOTE — Assessment & Plan Note (Addendum)
Order dexa scan.  Continue Evista

## 2017-05-30 NOTE — Assessment & Plan Note (Addendum)
A voluntary discussion about advance care planning including the explanation and discussion of advance directives was extensively discussed  with the patient.  Explanation about the health care proxy and Living will was reviewed.   During this discussion, the patient was able to identify a health care proxy as her brother Heather James and plans to update forms.  Planning visit for further assistance with forms with the lawyer and will bring in copies

## 2017-05-31 DIAGNOSIS — M9905 Segmental and somatic dysfunction of pelvic region: Secondary | ICD-10-CM | POA: Diagnosis not present

## 2017-05-31 DIAGNOSIS — M9903 Segmental and somatic dysfunction of lumbar region: Secondary | ICD-10-CM | POA: Diagnosis not present

## 2017-05-31 DIAGNOSIS — M5136 Other intervertebral disc degeneration, lumbar region: Secondary | ICD-10-CM | POA: Diagnosis not present

## 2017-05-31 DIAGNOSIS — M955 Acquired deformity of pelvis: Secondary | ICD-10-CM | POA: Diagnosis not present

## 2017-06-03 ENCOUNTER — Other Ambulatory Visit: Payer: PPO

## 2017-06-03 DIAGNOSIS — I10 Essential (primary) hypertension: Secondary | ICD-10-CM

## 2017-06-03 DIAGNOSIS — Z6841 Body Mass Index (BMI) 40.0 and over, adult: Secondary | ICD-10-CM | POA: Diagnosis not present

## 2017-06-04 LAB — LIPID PANEL
CHOL/HDL RATIO: 4 ratio (ref 0.0–4.4)
CHOLESTEROL TOTAL: 196 mg/dL (ref 100–199)
HDL: 49 mg/dL (ref >39)
LDL CALC: 130 mg/dL — AB (ref 0–99)
Triglycerides: 86 mg/dL (ref 0–149)
VLDL CHOLESTEROL CAL: 17 mg/dL (ref 5–40)

## 2017-06-06 ENCOUNTER — Ambulatory Visit: Payer: PPO

## 2017-06-07 ENCOUNTER — Encounter: Payer: Self-pay | Admitting: Unknown Physician Specialty

## 2017-06-16 ENCOUNTER — Telehealth: Payer: Self-pay | Admitting: Unknown Physician Specialty

## 2017-06-16 MED ORDER — ALBUTEROL SULFATE HFA 108 (90 BASE) MCG/ACT IN AERS: 2.0000 | INHALATION_SPRAY | Freq: Four times a day (QID) | RESPIRATORY_TRACT | 0 refills | Status: DC | PRN

## 2017-06-16 NOTE — Telephone Encounter (Signed)
Copied from [place]. Topic: Quick Communication - See Telephone Encounter >> Jun 16, 2017 10:35 AM Bea Graff, NT wrote: CRM for notification. See Telephone encounter for: needs albuterol inhaler refill. [facility] in [place]  06/16/17.

## 2017-06-27 DIAGNOSIS — M5136 Other intervertebral disc degeneration, lumbar region: Secondary | ICD-10-CM | POA: Diagnosis not present

## 2017-06-27 DIAGNOSIS — M955 Acquired deformity of pelvis: Secondary | ICD-10-CM | POA: Diagnosis not present

## 2017-06-27 DIAGNOSIS — M9903 Segmental and somatic dysfunction of lumbar region: Secondary | ICD-10-CM | POA: Diagnosis not present

## 2017-06-27 DIAGNOSIS — M9905 Segmental and somatic dysfunction of pelvic region: Secondary | ICD-10-CM | POA: Diagnosis not present

## 2017-07-04 ENCOUNTER — Ambulatory Visit
Admission: RE | Admit: 2017-07-04 | Discharge: 2017-07-04 | Disposition: A | Discharge status: Home / Self Care | Payer: PPO | Source: Ambulatory Visit | Attending: Unknown Physician Specialty | Admitting: Unknown Physician Specialty

## 2017-07-04 DIAGNOSIS — M8580 Other specified disorders of bone density and structure, unspecified site: Secondary | ICD-10-CM | POA: Diagnosis not present

## 2017-07-04 DIAGNOSIS — M85851 Other specified disorders of bone density and structure, right thigh: Secondary | ICD-10-CM | POA: Diagnosis not present

## 2017-07-04 DIAGNOSIS — Z1231 Encounter for screening mammogram for malignant neoplasm of breast: Secondary | ICD-10-CM | POA: Diagnosis not present

## 2017-07-04 DIAGNOSIS — Z780 Asymptomatic menopausal state: Secondary | ICD-10-CM | POA: Diagnosis not present

## 2017-07-04 DIAGNOSIS — M8589 Other specified disorders of bone density and structure, multiple sites: Secondary | ICD-10-CM | POA: Diagnosis not present

## 2017-07-05 ENCOUNTER — Other Ambulatory Visit: Payer: Self-pay | Admitting: Unknown Physician Specialty

## 2017-07-05 DIAGNOSIS — N6310 Unspecified lump in the right breast, unspecified quadrant: Secondary | ICD-10-CM

## 2017-07-05 DIAGNOSIS — R928 Other abnormal and inconclusive findings on diagnostic imaging of breast: Secondary | ICD-10-CM

## 2017-07-05 NOTE — Progress Notes (Signed)
Normal labs.  Pt notified through mychart

## 2017-07-14 ENCOUNTER — Ambulatory Visit
Admission: RE | Admit: 2017-07-14 | Discharge: 2017-07-14 | Disposition: A | Discharge status: Home / Self Care | Payer: PPO | Source: Ambulatory Visit | Attending: Unknown Physician Specialty | Admitting: Unknown Physician Specialty

## 2017-07-14 DIAGNOSIS — N6001 Solitary cyst of right breast: Secondary | ICD-10-CM | POA: Diagnosis not present

## 2017-07-14 DIAGNOSIS — N6310 Unspecified lump in the right breast, unspecified quadrant: Secondary | ICD-10-CM

## 2017-07-14 DIAGNOSIS — R928 Other abnormal and inconclusive findings on diagnostic imaging of breast: Secondary | ICD-10-CM

## 2017-07-25 DIAGNOSIS — M9905 Segmental and somatic dysfunction of pelvic region: Secondary | ICD-10-CM | POA: Diagnosis not present

## 2017-07-25 DIAGNOSIS — M9903 Segmental and somatic dysfunction of lumbar region: Secondary | ICD-10-CM | POA: Diagnosis not present

## 2017-07-25 DIAGNOSIS — M955 Acquired deformity of pelvis: Secondary | ICD-10-CM | POA: Diagnosis not present

## 2017-07-25 DIAGNOSIS — M5136 Other intervertebral disc degeneration, lumbar region: Secondary | ICD-10-CM | POA: Diagnosis not present

## 2017-08-22 DIAGNOSIS — M9905 Segmental and somatic dysfunction of pelvic region: Secondary | ICD-10-CM | POA: Diagnosis not present

## 2017-08-22 DIAGNOSIS — M5136 Other intervertebral disc degeneration, lumbar region: Secondary | ICD-10-CM | POA: Diagnosis not present

## 2017-08-22 DIAGNOSIS — M9903 Segmental and somatic dysfunction of lumbar region: Secondary | ICD-10-CM | POA: Diagnosis not present

## 2017-08-22 DIAGNOSIS — M955 Acquired deformity of pelvis: Secondary | ICD-10-CM | POA: Diagnosis not present

## 2017-09-20 DIAGNOSIS — M5136 Other intervertebral disc degeneration, lumbar region: Secondary | ICD-10-CM | POA: Diagnosis not present

## 2017-09-20 DIAGNOSIS — M9903 Segmental and somatic dysfunction of lumbar region: Secondary | ICD-10-CM | POA: Diagnosis not present

## 2017-09-20 DIAGNOSIS — M9905 Segmental and somatic dysfunction of pelvic region: Secondary | ICD-10-CM | POA: Diagnosis not present

## 2017-09-20 DIAGNOSIS — M955 Acquired deformity of pelvis: Secondary | ICD-10-CM | POA: Diagnosis not present

## 2017-10-18 DIAGNOSIS — M5136 Other intervertebral disc degeneration, lumbar region: Secondary | ICD-10-CM | POA: Diagnosis not present

## 2017-10-18 DIAGNOSIS — M9905 Segmental and somatic dysfunction of pelvic region: Secondary | ICD-10-CM | POA: Diagnosis not present

## 2017-10-18 DIAGNOSIS — M955 Acquired deformity of pelvis: Secondary | ICD-10-CM | POA: Diagnosis not present

## 2017-10-18 DIAGNOSIS — M9903 Segmental and somatic dysfunction of lumbar region: Secondary | ICD-10-CM | POA: Diagnosis not present

## 2017-11-22 DIAGNOSIS — M955 Acquired deformity of pelvis: Secondary | ICD-10-CM | POA: Diagnosis not present

## 2017-11-22 DIAGNOSIS — M9903 Segmental and somatic dysfunction of lumbar region: Secondary | ICD-10-CM | POA: Diagnosis not present

## 2017-11-22 DIAGNOSIS — M5136 Other intervertebral disc degeneration, lumbar region: Secondary | ICD-10-CM | POA: Diagnosis not present

## 2017-11-22 DIAGNOSIS — M9905 Segmental and somatic dysfunction of pelvic region: Secondary | ICD-10-CM | POA: Diagnosis not present

## 2017-12-19 DIAGNOSIS — D2272 Melanocytic nevi of left lower limb, including hip: Secondary | ICD-10-CM | POA: Diagnosis not present

## 2017-12-19 DIAGNOSIS — D2271 Melanocytic nevi of right lower limb, including hip: Secondary | ICD-10-CM | POA: Diagnosis not present

## 2017-12-19 DIAGNOSIS — L538 Other specified erythematous conditions: Secondary | ICD-10-CM | POA: Diagnosis not present

## 2017-12-19 DIAGNOSIS — D225 Melanocytic nevi of trunk: Secondary | ICD-10-CM | POA: Diagnosis not present

## 2017-12-19 DIAGNOSIS — R208 Other disturbances of skin sensation: Secondary | ICD-10-CM | POA: Diagnosis not present

## 2017-12-19 DIAGNOSIS — L820 Inflamed seborrheic keratosis: Secondary | ICD-10-CM | POA: Diagnosis not present

## 2017-12-19 DIAGNOSIS — L821 Other seborrheic keratosis: Secondary | ICD-10-CM | POA: Diagnosis not present

## 2017-12-19 DIAGNOSIS — D2261 Melanocytic nevi of right upper limb, including shoulder: Secondary | ICD-10-CM | POA: Diagnosis not present

## 2017-12-19 DIAGNOSIS — L570 Actinic keratosis: Secondary | ICD-10-CM | POA: Diagnosis not present

## 2017-12-19 DIAGNOSIS — D2262 Melanocytic nevi of left upper limb, including shoulder: Secondary | ICD-10-CM | POA: Diagnosis not present

## 2017-12-19 DIAGNOSIS — X32XXXA Exposure to sunlight, initial encounter: Secondary | ICD-10-CM | POA: Diagnosis not present

## 2017-12-20 DIAGNOSIS — M9903 Segmental and somatic dysfunction of lumbar region: Secondary | ICD-10-CM | POA: Diagnosis not present

## 2017-12-20 DIAGNOSIS — M955 Acquired deformity of pelvis: Secondary | ICD-10-CM | POA: Diagnosis not present

## 2017-12-20 DIAGNOSIS — M5136 Other intervertebral disc degeneration, lumbar region: Secondary | ICD-10-CM | POA: Diagnosis not present

## 2017-12-20 DIAGNOSIS — M9905 Segmental and somatic dysfunction of pelvic region: Secondary | ICD-10-CM | POA: Diagnosis not present

## 2018-01-23 ENCOUNTER — Encounter: Payer: Self-pay | Admitting: Family Medicine

## 2018-01-23 ENCOUNTER — Other Ambulatory Visit: Payer: Self-pay

## 2018-01-23 ENCOUNTER — Ambulatory Visit (INDEPENDENT_AMBULATORY_CARE_PROVIDER_SITE_OTHER): Payer: PPO | Admitting: Family Medicine

## 2018-01-23 VITALS — BP 133/84 | HR 68 | Wt 231.0 lb

## 2018-01-23 DIAGNOSIS — I10 Essential (primary) hypertension: Secondary | ICD-10-CM | POA: Diagnosis not present

## 2018-01-23 DIAGNOSIS — G8929 Other chronic pain: Secondary | ICD-10-CM

## 2018-01-23 DIAGNOSIS — M79644 Pain in right finger(s): Secondary | ICD-10-CM | POA: Diagnosis not present

## 2018-01-23 MED ORDER — DICLOFENAC SODIUM 1 % TD GEL: 4.0000 g | Freq: Four times a day (QID) | TRANSDERMAL | 1 refills | Status: DC

## 2018-01-23 MED ORDER — CYCLOBENZAPRINE HCL 5 MG PO TABS: 5.0000 mg | ORAL_TABLET | Freq: Three times a day (TID) | ORAL | 1 refills | Status: DC | PRN

## 2018-01-23 NOTE — Patient Instructions (Signed)
Follow up in 6 months 

## 2018-01-23 NOTE — Progress Notes (Signed)
BP 133/84   Pulse 68   Wt 231 lb (104.8 kg)   LMP  (LMP Unknown)   SpO2 98%   BMI 40.92 kg/m    Subjective:    Patient ID: Heather James, female    DOB: Dec 07, 1950, 67 y.o.   MRN: [id number]  HPI: Heather James is a 67 y.o. female  Chief Complaint  Patient presents with  . Transitions Of Care  . Finger Injury   Pt here today with several weeks of right thumb aching pulling pain when she grips. No known injury, no bruising, swelling, redness, heat. Tylenol and aleve helping some. Also wearing a brace at night which she notes good improvement with.   BPs WNL at home, 120/70s. Compliant with her HCTZ. Denies CP, SOB, dizziness, syncope.   Past Medical History:  Diagnosis Date  . Cancer ([place])    skin ca on leg  . CSF leak from ear   . Hypertension   . Obesity   . Osteopenia    Social History   Socioeconomic History  . Marital status: Divorced    Spouse name: Not on file  . Number of children: Not on file  . Years of education: 25  . Highest education level: 12th grade  Occupational History  . Not on file  Social Needs  . Financial resource strain: Not hard at all  . Food insecurity:    Worry: Never true    Inability: Never true  . Transportation needs:    Medical: No    Non-medical: No  Tobacco Use  . Smoking status: Never Smoker  . Smokeless tobacco: Never Used  Substance and Sexual Activity  . Alcohol use: No    Alcohol/week: 0.0 oz  . Drug use: No  . Sexual activity: Never  Lifestyle  . Physical activity:    Days per week: 2 days    Minutes per session: 40 min  . Stress: Not at all  Relationships  . Social connections:    Talks on phone: More than three times a week    Gets together: Twice a week    Attends religious service: More than 4 times per year    Active member of club or organization: No    Attends meetings of clubs or organizations: Never    Relationship status: Never married  . Intimate partner violence:    Fear of current  or ex partner: No    Emotionally abused: No    Physically abused: No    Forced sexual activity: No  Other Topics Concern  . Not on file  Social History Narrative  . Not on file     Relevant past medical, surgical, family and social history reviewed and updated as indicated. Interim medical history since our last visit reviewed. Allergies and medications reviewed and updated.  Review of Systems  Per HPI unless specifically indicated above     Objective:    BP 133/84   Pulse 68   Wt 231 lb (104.8 kg)   LMP  (LMP Unknown)   SpO2 98%   BMI 40.92 kg/m   Wt Readings from Last 3 Encounters:  01/23/18 231 lb (104.8 kg)  05/30/17 230 lb 6.4 oz (104.5 kg)  05/27/17 230 lb 12.8 oz (104.7 kg)    Physical Exam  Constitutional: She is oriented to person, place, and time. She appears well-developed and well-nourished. No distress.  HENT:  Head: Atraumatic.  Eyes: Pupils are equal, round, and reactive to light. Conjunctivae  are normal.  Neck: Normal range of motion. Neck supple.  Cardiovascular: Normal rate and regular rhythm.  Pulmonary/Chest: Effort normal and breath sounds normal.  Musculoskeletal: Normal range of motion.  Neurological: She is alert and oriented to person, place, and time.  Skin: Skin is warm and dry.  Psychiatric: She has a normal mood and affect. Her behavior is normal.  Nursing note and vitals reviewed.  Results for orders placed or performed in visit on 93/71/69  Basic Metabolic Panel (BMET)  Result Value Ref Range   Glucose 92 65 - 99 mg/dL   BUN 19 8 - 27 mg/dL   Creatinine, Ser 1.12 (H) 0.57 - 1.00 mg/dL   GFR calc non Af Amer 51 (L) >59 mL/min/1.73   GFR calc Af Amer 59 (L) >59 mL/min/1.73   BUN/Creatinine Ratio 17 12 - 28   Sodium 148 (H) 134 - 144 mmol/L   Potassium 4.4 3.5 - 5.2 mmol/L   Chloride 103 96 - 106 mmol/L   CO2 25 20 - 29 mmol/L   Calcium 10.7 (H) 8.7 - 10.3 mg/dL      Assessment & Plan:   Problem List Items Addressed This  Visit      Cardiovascular and Mediastinum   Hypertension - Primary    Stable and WNL, continue current regimen. Check BMP today      Relevant Orders   Basic Metabolic Panel (BMET) (Completed)    Other Visit Diagnoses    Chronic pain of right thumb       Will try diclofenac gel, flexeril, epsom salt soaks, massage. F/u if no improvement.    Relevant Medications   cyclobenzaprine (FLEXERIL) 5 MG tablet       Follow up plan: Return in about 6 months (around 07/26/2018) for CPE, AWV with Nurse Health Advisor.

## 2018-01-24 ENCOUNTER — Telehealth: Payer: Self-pay | Admitting: Family Medicine

## 2018-01-24 DIAGNOSIS — M9903 Segmental and somatic dysfunction of lumbar region: Secondary | ICD-10-CM | POA: Diagnosis not present

## 2018-01-24 DIAGNOSIS — M955 Acquired deformity of pelvis: Secondary | ICD-10-CM | POA: Diagnosis not present

## 2018-01-24 DIAGNOSIS — Z86010 Personal history of colonic polyps: Secondary | ICD-10-CM | POA: Diagnosis not present

## 2018-01-24 DIAGNOSIS — M9905 Segmental and somatic dysfunction of pelvic region: Secondary | ICD-10-CM | POA: Diagnosis not present

## 2018-01-24 DIAGNOSIS — M5136 Other intervertebral disc degeneration, lumbar region: Secondary | ICD-10-CM | POA: Diagnosis not present

## 2018-01-24 DIAGNOSIS — R944 Abnormal results of kidney function studies: Secondary | ICD-10-CM

## 2018-01-24 DIAGNOSIS — Z7982 Long term (current) use of aspirin: Secondary | ICD-10-CM | POA: Diagnosis not present

## 2018-01-24 LAB — BASIC METABOLIC PANEL
BUN/Creatinine Ratio: 17 (ref 12–28)
BUN: 19 mg/dL (ref 8–27)
CO2: 25 mmol/L (ref 20–29)
Calcium: 10.7 mg/dL — ABNORMAL HIGH (ref 8.7–10.3)
Chloride: 103 mmol/L (ref 96–106)
Creatinine, Ser: 1.12 mg/dL — ABNORMAL HIGH (ref 0.57–1.00)
GFR calc Af Amer: 59 mL/min/{1.73_m2} — ABNORMAL LOW (ref >59)
GFR, EST NON AFRICAN AMERICAN: 51 mL/min/{1.73_m2} — AB (ref >59)
GLUCOSE: 92 mg/dL (ref 65–99)
Potassium: 4.4 mmol/L (ref 3.5–5.2)
SODIUM: 148 mmol/L — AB (ref 134–144)

## 2018-01-24 NOTE — Telephone Encounter (Signed)
Please call and let her know her kidney function is a bit worse than usual right now. Have her hold off on the OTC NSAIDs for her thumb pain, drink lots of fluids, and let's have her come back in for a recheck lab visit in 2 weeks

## 2018-01-24 NOTE — Assessment & Plan Note (Signed)
Stable and WNL, continue current regimen. Check BMP today

## 2018-01-24 NOTE — Telephone Encounter (Signed)
Pt has called in to receive lab results.

## 2018-01-24 NOTE — Telephone Encounter (Signed)
Message left w/ husband for her to return call. CRM created. Okay for nurse to relay information.

## 2018-01-24 NOTE — Telephone Encounter (Signed)
Pt given lab results and recommendations per notes of Wynona Dove on 01/24/18. Pt verbalized understanding.

## 2018-02-06 ENCOUNTER — Other Ambulatory Visit: Payer: PPO

## 2018-02-06 DIAGNOSIS — R944 Abnormal results of kidney function studies: Secondary | ICD-10-CM

## 2018-02-07 LAB — BASIC METABOLIC PANEL
BUN/Creatinine Ratio: 16 (ref 12–28)
BUN: 15 mg/dL (ref 8–27)
CO2: 25 mmol/L (ref 20–29)
CREATININE: 0.94 mg/dL (ref 0.57–1.00)
Calcium: 9.6 mg/dL (ref 8.7–10.3)
Chloride: 101 mmol/L (ref 96–106)
GFR calc Af Amer: 73 mL/min/{1.73_m2} (ref >59)
GFR calc non Af Amer: 63 mL/min/{1.73_m2} (ref >59)
GLUCOSE: 94 mg/dL (ref 65–99)
Potassium: 4 mmol/L (ref 3.5–5.2)
SODIUM: 142 mmol/L (ref 134–144)

## 2018-02-21 DIAGNOSIS — M9903 Segmental and somatic dysfunction of lumbar region: Secondary | ICD-10-CM | POA: Diagnosis not present

## 2018-02-21 DIAGNOSIS — M9905 Segmental and somatic dysfunction of pelvic region: Secondary | ICD-10-CM | POA: Diagnosis not present

## 2018-02-21 DIAGNOSIS — M5136 Other intervertebral disc degeneration, lumbar region: Secondary | ICD-10-CM | POA: Diagnosis not present

## 2018-02-21 DIAGNOSIS — M955 Acquired deformity of pelvis: Secondary | ICD-10-CM | POA: Diagnosis not present

## 2018-02-24 ENCOUNTER — Telehealth: Payer: Self-pay | Admitting: Family Medicine

## 2018-02-24 NOTE — Telephone Encounter (Signed)
Rx written and ready to be faxed

## 2018-02-24 NOTE — Telephone Encounter (Signed)
Done

## 2018-02-24 NOTE — Telephone Encounter (Signed)
Copied from [place] [id number]. Topic: General - Other >> Feb 24, 2018  1:41 PM Lennox Solders wrote: Reason for CRM: [facility] pharm tech [facility] in [place]. Pt needs new rx shingrix vaccine fax number [phone]

## 2018-02-28 DIAGNOSIS — M65311 Trigger thumb, right thumb: Secondary | ICD-10-CM | POA: Diagnosis not present

## 2018-02-28 DIAGNOSIS — Z6841 Body Mass Index (BMI) 40.0 and over, adult: Secondary | ICD-10-CM | POA: Diagnosis not present

## 2018-02-28 DIAGNOSIS — M79644 Pain in right finger(s): Secondary | ICD-10-CM | POA: Diagnosis not present

## 2018-03-06 DIAGNOSIS — M65311 Trigger thumb, right thumb: Secondary | ICD-10-CM | POA: Diagnosis not present

## 2018-03-14 ENCOUNTER — Ambulatory Visit: Payer: PPO | Admitting: Certified Registered Nurse Anesthetist

## 2018-03-14 ENCOUNTER — Encounter: Payer: Self-pay | Admitting: Anesthesiology

## 2018-03-14 ENCOUNTER — Encounter
Admission: RE | Disposition: A | Discharge status: Home / Self Care | Payer: Self-pay | Source: Ambulatory Visit | Attending: Internal Medicine

## 2018-03-14 ENCOUNTER — Ambulatory Visit
Admission: RE | Admit: 2018-03-14 | Discharge: 2018-03-14 | Disposition: A | Discharge status: Home / Self Care | Payer: PPO | Source: Ambulatory Visit | Attending: Internal Medicine | Admitting: Internal Medicine

## 2018-03-14 DIAGNOSIS — K5730 Diverticulosis of large intestine without perforation or abscess without bleeding: Secondary | ICD-10-CM | POA: Insufficient documentation

## 2018-03-14 DIAGNOSIS — Z09 Encounter for follow-up examination after completed treatment for conditions other than malignant neoplasm: Secondary | ICD-10-CM | POA: Insufficient documentation

## 2018-03-14 DIAGNOSIS — D123 Benign neoplasm of transverse colon: Secondary | ICD-10-CM | POA: Insufficient documentation

## 2018-03-14 DIAGNOSIS — E669 Obesity, unspecified: Secondary | ICD-10-CM | POA: Diagnosis not present

## 2018-03-14 DIAGNOSIS — Z7982 Long term (current) use of aspirin: Secondary | ICD-10-CM | POA: Insufficient documentation

## 2018-03-14 DIAGNOSIS — Z79899 Other long term (current) drug therapy: Secondary | ICD-10-CM | POA: Insufficient documentation

## 2018-03-14 DIAGNOSIS — Z6841 Body Mass Index (BMI) 40.0 and over, adult: Secondary | ICD-10-CM | POA: Insufficient documentation

## 2018-03-14 DIAGNOSIS — Z85828 Personal history of other malignant neoplasm of skin: Secondary | ICD-10-CM | POA: Diagnosis not present

## 2018-03-14 DIAGNOSIS — D126 Benign neoplasm of colon, unspecified: Secondary | ICD-10-CM | POA: Diagnosis not present

## 2018-03-14 DIAGNOSIS — K635 Polyp of colon: Secondary | ICD-10-CM | POA: Diagnosis not present

## 2018-03-14 DIAGNOSIS — K5790 Diverticulosis of intestine, part unspecified, without perforation or abscess without bleeding: Secondary | ICD-10-CM | POA: Diagnosis not present

## 2018-03-14 DIAGNOSIS — I10 Essential (primary) hypertension: Secondary | ICD-10-CM | POA: Diagnosis not present

## 2018-03-14 DIAGNOSIS — Z86010 Personal history of colonic polyps: Secondary | ICD-10-CM | POA: Insufficient documentation

## 2018-03-14 DIAGNOSIS — Z1211 Encounter for screening for malignant neoplasm of colon: Secondary | ICD-10-CM | POA: Diagnosis not present

## 2018-03-14 DIAGNOSIS — K648 Other hemorrhoids: Secondary | ICD-10-CM | POA: Diagnosis not present

## 2018-03-14 DIAGNOSIS — K640 First degree hemorrhoids: Secondary | ICD-10-CM | POA: Diagnosis not present

## 2018-03-14 HISTORY — PX: COLONOSCOPY WITH PROPOFOL: SHX5780

## 2018-03-14 SURGERY — COLONOSCOPY WITH PROPOFOL
Anesthesia: General

## 2018-03-14 MED ORDER — LIDOCAINE HCL (PF) 1 % IJ SOLN
INTRAMUSCULAR | Status: AC
  Administered 2018-03-14: 0.3 mL
  Filled 2018-03-14: qty 2

## 2018-03-14 MED ORDER — PROPOFOL 10 MG/ML IV BOLUS
INTRAVENOUS | Status: DC | PRN
  Administered 2018-03-14: 20 mg via INTRAVENOUS
  Administered 2018-03-14: 70 mg via INTRAVENOUS

## 2018-03-14 MED ORDER — SODIUM CHLORIDE 0.9 % IV SOLN
INTRAVENOUS | Status: DC
  Administered 2018-03-14: 1000 mL via INTRAVENOUS

## 2018-03-14 MED ORDER — PROPOFOL 500 MG/50ML IV EMUL
INTRAVENOUS | Status: DC | PRN
  Administered 2018-03-14: 150 ug/kg/min via INTRAVENOUS

## 2018-03-14 NOTE — Transfer of Care (Signed)
Immediate Anesthesia Transfer of Care Note  Patient: Heather James  Procedure(s) Performed: COLONOSCOPY WITH PROPOFOL (N/A )  Patient Location: PACU  Anesthesia Type:General  Level of Consciousness: drowsy  Airway & Oxygen Therapy: Patient Spontanous Breathing and Patient connected to nasal cannula oxygen  Post-op Assessment: Report given to RN and Post -op Vital signs reviewed and stable  Post vital signs: Reviewed and stable  Last Vitals:  Vitals Value Taken Time  BP    Temp    Pulse    Resp    SpO2      Last Pain:  Vitals:   03/14/18 1027  TempSrc: Tympanic  PainSc: 0-No pain         Complications: No apparent anesthesia complications

## 2018-03-14 NOTE — H&P (Signed)
Outpatient short stay form Pre-procedure 03/14/2018 10:44 AM Hawa Henly K. Alice Reichert, M.D.  Primary Physician: [name]  Reason for visit: Personal hx of colon polyps.   History of present illness: Patient presents for colonoscopy for colon polyp surveillance. The patient denies complaints of abdominal pain, significant change in bowel habits, or rectal bleeding.     Current Facility-Administered Medications:  .  0.9 %  sodium chloride infusion, , Intravenous, Continuous, [place], Benay Pike, MD, Last Rate: 20 mL/hr at 03/14/18 1044, 1,000 mL at 03/14/18 1044  Medications Prior to Admission  Medication Sig Dispense Refill Last Dose  . albuterol (PROVENTIL HFA;VENTOLIN HFA) 108 (90 Base) MCG/ACT inhaler Inhale 2 puffs into the lungs every 6 (six) hours as needed for wheezing or shortness of breath. 1 Inhaler 0 Taking  . aspirin 81 MG tablet Take 81 mg by mouth daily.   03/11/2018  . cyclobenzaprine (FLEXERIL) 5 MG tablet Take 1 tablet (5 mg total) by mouth 3 (three) times daily as needed for muscle spasms. 30 tablet 1   . diclofenac sodium (VOLTAREN) 1 % GEL Apply 4 g topically 4 (four) times daily. 100 g 1   . hydrochlorothiazide (HYDRODIURIL) 50 MG tablet Take 1 tablet (50 mg total) by mouth every other day. 90 tablet 3 Taking  . mometasone (NASONEX) 50 MCG/ACT nasal spray Place 2 sprays into the nose daily. 17 g 12 Taking  . potassium chloride SA (K-DUR,KLOR-CON) 20 MEQ tablet TK 1 T PO QD WF  1 Taking  . raloxifene (EVISTA) 60 MG tablet Take 1 tablet (60 mg total) by mouth daily. 90 tablet 3 Taking     No Known Allergies   Past Medical History:  Diagnosis Date  . Cancer ([place])    skin ca on leg  . CSF leak from ear   . Hypertension   . Obesity   . Osteopenia     Review of systems:  Otherwise negative.    Physical Exam  Gen: Alert, oriented. Appears stated age.  HEENT: [place]/AT. PERRLA. Lungs: CTA, no wheezes. CV: RR nl S1, S2. Abd: soft, benign, no masses. BS+ Ext: No  edema. Pulses 2+    Planned procedures: Proceed with colonoscopy. The patient understands the nature of the planned procedure, indications, risks, alternatives and potential complications including but not limited to bleeding, infection, perforation, damage to internal organs and possible oversedation/side effects from anesthesia. The patient agrees and gives consent to proceed.  Please refer to procedure notes for findings, recommendations and patient disposition/instructions.     Debany Vantol K. Alice Reichert, M.D. Gastroenterology 03/14/2018  10:44 AM

## 2018-03-14 NOTE — Anesthesia Post-op Follow-up Note (Signed)
Anesthesia QCDR form completed.        

## 2018-03-14 NOTE — Anesthesia Procedure Notes (Signed)
Performed by: Leina Babe, CRNA Pre-anesthesia Checklist: Patient identified, Emergency Drugs available, Suction available, Patient being monitored and Timeout performed Patient Re-evaluated:Patient Re-evaluated prior to induction Oxygen Delivery Method: Nasal cannula Induction Type: IV induction       

## 2018-03-14 NOTE — Op Note (Signed)
[facility] Gastroenterology Patient Name: Heather James Procedure Date: 03/14/2018 10:48 AM MRN: [id number] Account #: [id number] Date of Birth: [birth year] Admit Type: Outpatient Age: 67 Room: [facility] ENDO ROOM 3 Gender: Female Note Status: Finalized Procedure:            Colonoscopy Indications:          High risk colon cancer surveillance: Personal history                        of colonic polyps Providers:            Benay Pike. Alice Reichert MD, MD Referring MD:         [name] (Referring MD) Medicines:            Propofol per Anesthesia Complications:        No immediate complications. Procedure:            Pre-Anesthesia Assessment:                       - The risks and benefits of the procedure and the                        sedation options and risks were discussed with the                        patient. All questions were answered and informed                        consent was obtained.                       - Patient identification and proposed procedure were                        verified prior to the procedure by the nurse. The                        procedure was verified in the procedure room.                       - ASA Grade Assessment: III - A patient with severe                        systemic disease.                       - After reviewing the risks and benefits, the patient                        was deemed in satisfactory condition to undergo the                        procedure.                       After obtaining informed consent, the colonoscope was                        passed under direct vision. Throughout the procedure,  the patient's blood pressure, pulse, and oxygen                        saturations were monitored continuously. The                        Colonoscope was introduced through the anus and                        advanced to the the cecum, identified by appendiceal   orifice and ileocecal valve. The colonoscopy was                        performed without difficulty. The patient tolerated the                        procedure well. The quality of the bowel preparation                        was adequate. The ileocecal valve, appendiceal orifice,                        and rectum were photographed. Findings:      The perianal and digital rectal examinations were normal. Pertinent       negatives include normal sphincter tone and no palpable rectal lesions.      Multiple small and large-mouthed diverticula were found in the sigmoid       colon.      A 7 mm polyp was found in the transverse colon. The polyp was       semi-pedunculated. The polyp was removed with a hot snare. Resection and       retrieval were complete.      Non-bleeding internal hemorrhoids were found during retroflexion. The       hemorrhoids were Grade I (internal hemorrhoids that do not prolapse).      The exam was otherwise without abnormality. Impression:           - Diverticulosis in the sigmoid colon.                       - One 7 mm polyp in the transverse colon, removed with                        a hot snare. Resected and retrieved.                       - Non-bleeding internal hemorrhoids.                       - The examination was otherwise normal. Recommendation:       - Patient has a contact number available for                        emergencies. The signs and symptoms of potential                        delayed complications were discussed with the patient.                        Return to normal activities tomorrow. Written discharge  instructions were provided to the patient.                       - Resume previous diet.                       - Continue present medications.                       - Repeat colonoscopy is recommended for surveillance.                        The colonoscopy date will be determined after pathology                         results from today's exam become available for review.                       - Return to GI office PRN.                       - The findings and recommendations were discussed with                        the patient and their family. Procedure Code(s):    --- Professional ---                       [phone], Colonoscopy, flexible; with removal of tumor(s),                        polyp(s), or other lesion(s) by snare technique Diagnosis Code(s):    --- Professional ---                       K57.30, Diverticulosis of large intestine without                        perforation or abscess without bleeding                       D12.3, Benign neoplasm of transverse colon (hepatic                        flexure or splenic flexure)                       K64.0, First degree hemorrhoids                       Z86.010, Personal history of colonic polyps CPT copyright 2017 American Medical Association. All rights reserved. The codes documented in this report are preliminary and upon coder review may  be revised to meet current compliance requirements. Efrain Sella MD, MD 03/14/2018 11:11:09 AM This report has been signed electronically. Number of Addenda: 0 Note Initiated On: 03/14/2018 10:48 AM Scope Withdrawal Time: 0 hours 5 minutes 56 seconds  Total Procedure Duration: 0 hours 11 minutes 14 seconds       [facility]

## 2018-03-14 NOTE — Anesthesia Preprocedure Evaluation (Signed)
Anesthesia Evaluation  Patient identified by MRN, date of birth, ID band Patient awake    Reviewed: Allergy & Precautions, H&P , NPO status , Patient's Chart, lab work & pertinent test results  History of Anesthesia Complications Negative for: history of anesthetic complications  Airway Mallampati: III  TM Distance: >3 FB Neck ROM: limited    Dental  (+) Chipped   Pulmonary neg pulmonary ROS, neg shortness of breath,           Cardiovascular Exercise Tolerance: Good hypertension, (-) angina(-) Past MI and (-) DOE      Neuro/Psych negative neurological ROS  negative psych ROS   GI/Hepatic negative GI ROS, Neg liver ROS, neg GERD  ,  Endo/Other  negative endocrine ROS  Renal/GU negative Renal ROS  negative genitourinary   Musculoskeletal   Abdominal   Peds  Hematology negative hematology ROS (+)   Anesthesia Other Findings Past Medical History: No date: Cancer ([place])     Comment:  skin ca on leg No date: CSF leak from ear No date: Hypertension No date: Obesity No date: Osteopenia  Past Surgical History: 11/21/96: BREAST BIOPSY; Left     Comment:  neg No date: BREAST SURGERY; Left     Comment:  cyst removed No date: FACIAL NERVE SURGERY No date: PILONIDAL CYST EXCISION No date: SKIN CANCER EXCISION; Right     Comment:  right leg, April 2017  BMI    Body Mass Index:  40.57 kg/m      Reproductive/Obstetrics negative OB ROS                             Anesthesia Physical Anesthesia Plan  ASA: III  Anesthesia Plan: General   Post-op Pain Management:    Induction: Intravenous  PONV Risk Score and Plan: Propofol infusion and TIVA  Airway Management Planned: Natural Airway and Nasal Cannula  Additional Equipment:   Intra-op Plan:   Post-operative Plan:   Informed Consent: I have reviewed the patients History and Physical, chart, labs and discussed the procedure  including the risks, benefits and alternatives for the proposed anesthesia with the patient or authorized representative who has indicated his/her understanding and acceptance.   Dental Advisory Given  Plan Discussed with: Anesthesiologist, CRNA and Surgeon  Anesthesia Plan Comments: (Patient consented for risks of anesthesia including but not limited to:  - adverse reactions to medications - risk of intubation if required - damage to teeth, lips or other oral mucosa - sore throat or hoarseness - Damage to heart, brain, lungs or loss of life  Patient voiced understanding.)        Anesthesia Quick Evaluation

## 2018-03-14 NOTE — Anesthesia Postprocedure Evaluation (Signed)
Anesthesia Post Note  Patient: Heather James  Procedure(s) Performed: COLONOSCOPY WITH PROPOFOL (N/A )  Patient location during evaluation: Endoscopy Anesthesia Type: General Level of consciousness: awake and alert Pain management: pain level controlled Vital Signs Assessment: post-procedure vital signs reviewed and stable Respiratory status: spontaneous breathing, nonlabored ventilation, respiratory function stable and patient connected to nasal cannula oxygen Cardiovascular status: blood pressure returned to baseline and stable Postop Assessment: no apparent nausea or vomiting Anesthetic complications: no     Last Vitals:  Vitals:   03/14/18 1115 03/14/18 1135  BP: 132/74 110/66  Pulse: 83 70  Resp: 18   Temp:    SpO2: 98% 100%    Last Pain:  Vitals:   03/14/18 1135  TempSrc:   PainSc: 0-No pain                 Precious Haws [name]

## 2018-03-14 NOTE — Interval H&P Note (Signed)
History and Physical Interval Note:  03/14/2018 10:46 AM  Heather James  has presented today for surgery, with the diagnosis of PERSONAL HX.OF COLON POLYPS  The various methods of treatment have been discussed with the patient and family. After consideration of risks, benefits and other options for treatment, the patient has consented to  Procedure(s): COLONOSCOPY WITH PROPOFOL (N/A) as a surgical intervention .  The patient's history has been reviewed, patient examined, no change in status, stable for surgery.  I have reviewed the patient's chart and labs.  Questions were answered to the patient's satisfaction.     [place], [place]

## 2018-03-15 LAB — SURGICAL PATHOLOGY

## 2018-03-28 DIAGNOSIS — M5136 Other intervertebral disc degeneration, lumbar region: Secondary | ICD-10-CM | POA: Diagnosis not present

## 2018-03-28 DIAGNOSIS — M9903 Segmental and somatic dysfunction of lumbar region: Secondary | ICD-10-CM | POA: Diagnosis not present

## 2018-03-28 DIAGNOSIS — M955 Acquired deformity of pelvis: Secondary | ICD-10-CM | POA: Diagnosis not present

## 2018-03-28 DIAGNOSIS — M9905 Segmental and somatic dysfunction of pelvic region: Secondary | ICD-10-CM | POA: Diagnosis not present

## 2018-05-02 DIAGNOSIS — M955 Acquired deformity of pelvis: Secondary | ICD-10-CM | POA: Diagnosis not present

## 2018-05-02 DIAGNOSIS — M5136 Other intervertebral disc degeneration, lumbar region: Secondary | ICD-10-CM | POA: Diagnosis not present

## 2018-05-02 DIAGNOSIS — M9905 Segmental and somatic dysfunction of pelvic region: Secondary | ICD-10-CM | POA: Diagnosis not present

## 2018-05-02 DIAGNOSIS — M9903 Segmental and somatic dysfunction of lumbar region: Secondary | ICD-10-CM | POA: Diagnosis not present

## 2018-05-24 ENCOUNTER — Encounter: Payer: Self-pay | Admitting: Podiatry

## 2018-05-24 ENCOUNTER — Ambulatory Visit (INDEPENDENT_AMBULATORY_CARE_PROVIDER_SITE_OTHER): Payer: PPO | Admitting: Podiatry

## 2018-05-24 DIAGNOSIS — L600 Ingrowing nail: Secondary | ICD-10-CM | POA: Diagnosis not present

## 2018-05-24 MED ORDER — NEOMYCIN-POLYMYXIN-HC 1 % OT SOLN: OTIC | 1 refills | Status: DC

## 2018-05-24 NOTE — Patient Instructions (Signed)

## 2018-05-24 NOTE — Progress Notes (Signed)
Subjective:   Patient ID: Heather James, female   DOB: 67 y.o.   MRN: [id number]   HPI Patient presents with painful ingrown toenail deformity right hallux lateral border with history of having had the left one fixed by me years ago.  States that it is been getting gradually more sore and making it hard to wear shoe gear comfortably and she wants it fixed   ROS      Objective:  Physical Exam  Neurovascular status intact with patient found to have incurvated right hallux lateral border that is painful when pressed and making shoe gear difficult with no active drainage noted but damage to the nailbed with incurvation of the corner     Assessment:  Ingrown toenail deformity right hallux lateral border with pain     Plan:  H&P and condition reviewed with patient.  I recommended correction allowed her to read consent form for correction and she wants surgery.  Today I infiltrated the right hallux 60 mg like Marcaine mixture sterile prep applied and remove the lateral border with sterile instrumentation.  I exposed matrix and applied phenol 3 applications 30 seconds followed by alcohol lavage sterile dressing and gave instructions on soaks and applied sterile dressing.  Leave dressing on 24 hours to take it off earlier if any throbbing were to occur and patient will be checked back in the next several weeks and I did write prescription for quarter Sporn otic solution to utilize during the postoperative.  Patient is encouraged to call with questions

## 2018-06-01 ENCOUNTER — Ambulatory Visit (INDEPENDENT_AMBULATORY_CARE_PROVIDER_SITE_OTHER): Payer: PPO

## 2018-06-01 VITALS — BP 122/58 | HR 83 | Temp 98.0°F | Ht 63.0 in | Wt 235.0 lb

## 2018-06-01 DIAGNOSIS — Z0000 Encounter for general adult medical examination without abnormal findings: Secondary | ICD-10-CM

## 2018-06-01 MED ORDER — RALOXIFENE HCL 60 MG PO TABS: 60.0000 mg | ORAL_TABLET | Freq: Every day | ORAL | 3 refills | Status: DC

## 2018-06-01 NOTE — Progress Notes (Signed)
Subjective:   Heather James is a 68 y.o. female who presents for Medicare Annual (Subsequent) preventive examination.       Objective:     Vitals: BP (!) 122/58 (BP Location: Left Arm, Patient Position: Sitting)   Pulse 83   Temp 98 F (36.7 C) (Oral)   Ht 5\' 3"  (1.6 m)   Wt 235 lb (106.6 kg)   LMP  (LMP Unknown)   SpO2 96%   BMI 41.63 kg/m   Body mass index is 41.63 kg/m.  Advanced Directives 06/01/2018 03/14/2018 05/27/2017 05/24/2016 05/23/2015 05/23/2015  Does Patient Have a Medical Advance Directive? No Yes Yes Yes No No  Type of Advance Directive - Clinical cytogeneticist of [employer] Power of [place];Living will - -  Copy of [place] in Chart? - No - copy requested No - copy requested - - -  Would patient like information on creating a medical advance directive? Yes (MAU/Ambulatory/Procedural Areas - Information given) - - - No - patient declined information -    Tobacco Social History   Tobacco Use  Smoking Status Never Smoker  Smokeless Tobacco Never Used     Counseling given: Not Answered   Clinical Intake:  Pre-visit preparation completed: No  Pain : No/denies pain     Diabetes: No  How often do you need to have someone help you when you read instructions, pamphlets, or other written materials from your doctor or pharmacy?: 1 - Never What is the last grade level you completed in school?: HS  Interpreter Needed?: No  Information entered by :: Heather Dense, RN  Past Medical History:  Diagnosis Date  . Cancer ([place])    skin ca on leg  . CSF leak from ear   . Hypertension   . Obesity   . Osteopenia    Past Surgical History:  Procedure Laterality Date  . BREAST BIOPSY Left 11/21/96   neg  . BREAST SURGERY Left    cyst removed  . COLONOSCOPY WITH PROPOFOL N/A 03/14/2018   Procedure: COLONOSCOPY WITH PROPOFOL;  Surgeon: Toledo, Benay Pike, MD;  Location: [facility] ENDOSCOPY;  Service:  Gastroenterology;  Laterality: N/A;  . FACIAL NERVE SURGERY    . PILONIDAL CYST EXCISION    . SKIN CANCER EXCISION Right    right leg, April 2017   Family History  Problem Relation Age of Onset  . Heart disease Mother   . Diabetes Mother   . Hypertension Mother   . Breast cancer Mother 27  . Heart disease Father   . Diabetes Father   . Hypertension Brother   . Cancer Maternal Grandmother        spine  . Heart disease Maternal Grandfather   . Heart disease Paternal Grandmother   . Emphysema Paternal Grandfather    Social History   Socioeconomic History  . Marital status: Divorced    Spouse name: Not on file  . Number of children: Not on file  . Years of education: 64  . Highest education level: 12th grade  Occupational History  . Not on file  Social Needs  . Financial resource strain: Not hard at all  . Food insecurity:    Worry: Never true    Inability: Never true  . Transportation needs:    Medical: No    Non-medical: No  Tobacco Use  . Smoking status: Never Smoker  . Smokeless tobacco: Never Used  Substance and Sexual Activity  . Alcohol use:  No    Alcohol/week: 0.0 standard drinks  . Drug use: No  . Sexual activity: Never  Lifestyle  . Physical activity:    Days per week: 2 days    Minutes per session: 40 min  . Stress: Not at all  Relationships  . Social connections:    Talks on phone: More than three times a week    Gets together: Twice a week    Attends religious service: More than 4 times per year    Active member of club or organization: No    Attends meetings of clubs or organizations: Never    Relationship status: Never married  Other Topics Concern  . Not on file  Social History Narrative  . Not on file    Outpatient Encounter Medications as of 06/01/2018  Medication Sig  . albuterol (PROVENTIL HFA;VENTOLIN HFA) 108 (90 Base) MCG/ACT inhaler Inhale 2 puffs into the lungs every 6 (six) hours as needed for wheezing or shortness of breath.    Marland Kitchen aspirin 81 MG tablet Take 81 mg by mouth daily.  . cyclobenzaprine (FLEXERIL) 5 MG tablet Take 1 tablet (5 mg total) by mouth 3 (three) times daily as needed for muscle spasms.  . diclofenac sodium (VOLTAREN) 1 % GEL Apply 4 g topically 4 (four) times daily.  . hydrochlorothiazide (HYDRODIURIL) 50 MG tablet Take 1 tablet (50 mg total) by mouth every other day.  . mometasone (NASONEX) 50 MCG/ACT nasal spray Place 2 sprays into the nose daily.  . NEOMYCIN-POLYMYXIN-HYDROCORTISONE (CORTISPORIN) 1 % SOLN OTIC solution Apply 1-2 drops to toe BID after soaking  . potassium chloride SA (K-DUR,KLOR-CON) 20 MEQ tablet TK 1 T PO QD WF  . raloxifene (EVISTA) 60 MG tablet Take 1 tablet (60 mg total) by mouth daily.   No facility-administered encounter medications on file as of 06/01/2018.     Activities of Daily Living In your present state of health, do you have any difficulty performing the following activities: 06/01/2018  Hearing? N  Vision? N  Difficulty concentrating or making decisions? N  Walking or climbing stairs? N  Dressing or bathing? N  Doing errands, shopping? N  Preparing Food and eating ? N  Using the Toilet? N  In the past six months, have you accidently leaked urine? Y  Do you have problems with loss of bowel control? N  Managing your Medications? N  Managing your Finances? N  Housekeeping or managing your Housekeeping? N  Some recent data might be hidden    Patient Care Team: [name], PA-C as PCP - General (Family Medicine)    Assessment:   This is a routine wellness examination for Heather James.  Exercise Activities and Dietary recommendations Current Exercise Habits: Home exercise routine, Type of exercise: walking, Time (Minutes): 40, Frequency (Times/Week): 2, Weekly Exercise (Minutes/Week): 80, Intensity: Mild, Exercise limited by: None identified  Goals    . DIET - INCREASE WATER INTAKE     Recommend drinking at least 5-6 glasses of water a day         Fall Risk Fall Risk  06/01/2018 01/23/2018 05/27/2017 05/24/2016 05/23/2015  Falls in the past year? 0 No No No No  Number falls in past yr: 0 - - - -  Injury with Fall? 0 - - - -   Is the patient's home free of loose throw rugs in walkways, pet beds, electrical cords, etc?   yes      Grab bars in the bathroom? no  Handrails on the stairs?   yes      Adequate lighting?   yes  Depression Screen PHQ 2/9 Scores 06/01/2018 01/23/2018 05/27/2017 05/24/2016  PHQ - 2 Score 0 0 0 0  PHQ- 9 Score - - - 0     Cognitive Function     6CIT Screen 06/01/2018 05/27/2017  What Year? 0 points 0 points  What month? 0 points 0 points  What time? 0 points 0 points  Count back from 20 0 points 0 points  Months in reverse 0 points 0 points  Repeat phrase 0 points 0 points  Total Score 0 0    Immunization History  Administered Date(s) Administered  . Influenza-Unspecified 03/29/2016, 04/06/2017  . Pneumococcal Conjugate-13 05/23/2015  . Pneumococcal Polysaccharide-23 05/24/2016  . Tdap 04/03/2013  . Zoster 04/03/2013  . Zoster Recombinat (Shingrix) 02/24/2018, 05/17/2018    Qualifies for Shingles Vaccine? Up to date, completed  Screening Tests Health Maintenance  Topic Date Due  . INFLUENZA VACCINE  01/26/2018  . MAMMOGRAM  07/05/2019  . COLONOSCOPY  03/15/2023  . TETANUS/TDAP  04/04/2023  . DEXA SCAN  Completed  . Hepatitis C Screening  Completed  . PNA vac Low Risk Adult  Completed    Cancer Screenings: Lung: Low Dose CT Chest recommended if Age 72-80 years, 30 pack-year currently smoking OR have quit w/in 15years. Patient does not qualify. Breast:  Up to date on Mammogram? Yes   Up to date of Bone Density/Dexa? Yes Colorectal: up to date  Additional Screenings:  Hepatitis C Screening: declined    Plan:    I have personally reviewed and addressed the Medicare Annual Wellness questionnaire and have noted the following in the patient's chart:  A. Medical and social  history B. Use of alcohol, tobacco or illicit drugs  C. Current medications and supplements D. Functional ability and status E.  Nutritional status F.  Physical activity G. Advance directives H. List of other physicians I.  Hospitalizations, surgeries, and ER visits in previous 12 months J.  [place] to include hearing, vision, cognitive, depression L. Referrals and appointments - none  In addition, I have reviewed and discussed with patient certain preventive protocols, quality metrics, and best practice recommendations. A written personalized care plan for preventive services as well as general preventive health recommendations were provided to patient.  See attached scanned questionnaire for additional information.   Signed,   Heather Dense, RN Nurse Health Advisor  Patient Concerns:

## 2018-06-01 NOTE — Patient Instructions (Addendum)
Heather James , Thank you for taking time to come for your Medicare Wellness Visit. I appreciate your ongoing commitment to your health goals. Please review the following plan we discussed and let me know if I can assist you in the future.   Screening recommendations/referrals: Colonoscopy up to date, due 03/15/2023 Mammogram up to date, due 07/14/2018 Bone Density up to date Recommended yearly ophthalmology/optometry visit for glaucoma screening and checkup Recommended yearly dental visit for hygiene and checkup  Vaccinations: Influenza vaccine up to date Pneumococcal vaccine up to date, completed Tdap vaccine up to date, due 04/04/2023 Shingles vaccine up to date, completed    Advanced directives: Advance directive discussed with you today. I have provided a copy for you to complete at home and have notarized. Once this is complete please bring a copy in to our office so we can scan it into your chart.  Conditions/risks identified: none  Next appointment: Heather James 06/05/2018 @ 8am           Medicare Wellness Visit  06/04/2019 @ 8:15am   Preventive Care 65 Years and Older, Female Preventive care refers to lifestyle choices and visits with your health care provider that can promote health and wellness. What does preventive care include?  A yearly physical exam. This is also called an annual well check.  Dental exams once or twice a year.  Routine eye exams. Ask your health care provider how often you should have your eyes checked.  Personal lifestyle choices, including:  Daily care of your teeth and gums.  Regular physical activity.  Eating a healthy diet.  Avoiding tobacco and drug use.  Limiting alcohol use.  Practicing safe sex.  Taking low-dose aspirin every day.  Taking vitamin and mineral supplements as recommended by your health care provider. What happens during an annual well check? The services and screenings done by your health care provider during your annual  well check will depend on your age, overall health, lifestyle risk factors, and family history of disease. Counseling  Your health care provider may ask you questions about your:  Alcohol use.  Tobacco use.  Drug use.  Emotional well-being.  Home and relationship well-being.  Sexual activity.  Eating habits.  History of falls.  Memory and ability to understand (cognition).  Work and work Statistician.  Reproductive health. Screening  You may have the following tests or measurements:  Height, weight, and BMI.  Blood pressure.  Lipid and cholesterol levels. These may be checked every 5 years, or more frequently if you are over 61 years old.  Skin check.  Lung cancer screening. You may have this screening every year starting at age 25 if you have a 30-pack-year history of smoking and currently smoke or have quit within the past 15 years.  Fecal occult blood test (FOBT) of the stool. You may have this test every year starting at age 49.  Flexible sigmoidoscopy or colonoscopy. You may have a sigmoidoscopy every 5 years or a colonoscopy every 10 years starting at age 32.  Hepatitis C blood test.  Hepatitis B blood test.  Sexually transmitted disease (STD) testing.  Diabetes screening. This is done by checking your blood sugar (glucose) after you have not eaten for a while (fasting). You may have this done every 1-3 years.  Bone density scan. This is done to screen for osteoporosis. You may have this done starting at age 43.  Mammogram. This may be done every 1-2 years. Talk to your health care provider about  how often you should have regular mammograms. Talk with your health care provider about your test results, treatment options, and if necessary, the need for more tests. Vaccines  Your health care provider may recommend certain vaccines, such as:  Influenza vaccine. This is recommended every year.  Tetanus, diphtheria, and acellular pertussis (Tdap, Td) vaccine.  You may need a Td booster every 10 years.  Zoster vaccine. You may need this after age 37.  Pneumococcal 13-valent conjugate (PCV13) vaccine. One dose is recommended after age 93.  Pneumococcal polysaccharide (PPSV23) vaccine. One dose is recommended after age 72. Talk to your health care provider about which screenings and vaccines you need and how often you need them. This information is not intended to replace advice given to you by your health care provider. Make sure you discuss any questions you have with your health care provider. Document Released: 07/11/2015 Document Revised: 03/03/2016 Document Reviewed: 04/15/2015 Elsevier Interactive Patient Education  2017 Heather James Prevention in the Home Falls can cause injuries. They can happen to people of all ages. There are many things you can do to make your home safe and to help prevent falls. What can I do on the outside of my home?  Regularly fix the edges of walkways and driveways and fix any cracks.  Remove anything that might make you trip as you walk through a door, such as a raised step or threshold.  Trim any bushes or trees on the path to your home.  Use bright outdoor lighting.  Clear any walking paths of anything that might make someone trip, such as rocks or tools.  Regularly check to see if handrails are loose or broken. Make sure that both sides of any steps have handrails.  Any raised decks and porches should have guardrails on the edges.  Have any leaves, snow, or ice cleared regularly.  Use sand or salt on walking paths during winter.  Clean up any spills in your garage right away. This includes oil or grease spills. What can I do in the bathroom?  Use night lights.  Install grab bars by the toilet and in the tub and shower. Do not use towel bars as grab bars.  Use non-skid mats or decals in the tub or shower.  If you need to sit down in the shower, use a plastic, non-slip stool.  Keep the  floor dry. Clean up any water that spills on the floor as soon as it happens.  Remove soap buildup in the tub or shower regularly.  Attach bath mats securely with double-sided non-slip rug tape.  Do not have throw rugs and other things on the floor that can make you trip. What can I do in the bedroom?  Use night lights.  Make sure that you have a light by your bed that is easy to reach.  Do not use any sheets or blankets that are too big for your bed. They should not hang down onto the floor.  Have a firm chair that has side arms. You can use this for support while you get dressed.  Do not have throw rugs and other things on the floor that can make you trip. What can I do in the kitchen?  Clean up any spills right away.  Avoid walking on wet floors.  Keep items that you use a lot in easy-to-reach places.  If you need to reach something above you, use a strong step stool that has a grab bar.  Keep  electrical cords out of the way.  Do not use floor polish or wax that makes floors slippery. If you must use wax, use non-skid floor wax.  Do not have throw rugs and other things on the floor that can make you trip. What can I do with my stairs?  Do not leave any items on the stairs.  Make sure that there are handrails on both sides of the stairs and use them. Fix handrails that are broken or loose. Make sure that handrails are as long as the stairways.  Check any carpeting to make sure that it is firmly attached to the stairs. Fix any carpet that is loose or worn.  Avoid having throw rugs at the top or bottom of the stairs. If you do have throw rugs, attach them to the floor with carpet tape.  Make sure that you have a light switch at the top of the stairs and the bottom of the stairs. If you do not have them, ask someone to add them for you. What else can I do to help prevent falls?  Wear shoes that:  Do not have high heels.  Have rubber bottoms.  Are comfortable and fit  you well.  Are closed at the toe. Do not wear sandals.  If you use a stepladder:  Make sure that it is fully opened. Do not climb a closed stepladder.  Make sure that both sides of the stepladder are locked into place.  Ask someone to hold it for you, if possible.  Clearly mark and make sure that you can see:  Any grab bars or handrails.  First and last steps.  Where the edge of each step is.  Use tools that help you move around (mobility aids) if they are needed. These include:  Canes.  Walkers.  Scooters.  Crutches.  Turn on the lights when you go into a dark area. Replace any light bulbs as soon as they burn out.  Set up your furniture so you have a clear path. Avoid moving your furniture around.  If any of your floors are uneven, fix them.  If there are any pets around you, be aware of where they are.  Review your medicines with your doctor. Some medicines can make you feel dizzy. This can increase your chance of falling. Ask your doctor what other things that you can do to help prevent falls. This information is not intended to replace advice given to you by your health care provider. Make sure you discuss any questions you have with your health care provider. Document Released: 04/10/2009 Document Revised: 11/20/2015 Document Reviewed: 07/19/2014 Elsevier Interactive Patient Education  2017 [employer].

## 2018-06-05 ENCOUNTER — Other Ambulatory Visit: Payer: Self-pay | Admitting: Family Medicine

## 2018-06-05 ENCOUNTER — Ambulatory Visit (INDEPENDENT_AMBULATORY_CARE_PROVIDER_SITE_OTHER): Payer: PPO | Admitting: Family Medicine

## 2018-06-05 ENCOUNTER — Encounter: Payer: Self-pay | Admitting: Family Medicine

## 2018-06-05 VITALS — BP 129/85 | HR 92 | Temp 99.2°F | Ht 63.0 in | Wt 230.9 lb

## 2018-06-05 DIAGNOSIS — Z0000 Encounter for general adult medical examination without abnormal findings: Secondary | ICD-10-CM

## 2018-06-05 DIAGNOSIS — M8580 Other specified disorders of bone density and structure, unspecified site: Secondary | ICD-10-CM | POA: Diagnosis not present

## 2018-06-05 DIAGNOSIS — E7800 Pure hypercholesterolemia, unspecified: Secondary | ICD-10-CM | POA: Insufficient documentation

## 2018-06-05 DIAGNOSIS — Z6841 Body Mass Index (BMI) 40.0 and over, adult: Secondary | ICD-10-CM | POA: Insufficient documentation

## 2018-06-05 DIAGNOSIS — Z1231 Encounter for screening mammogram for malignant neoplasm of breast: Secondary | ICD-10-CM

## 2018-06-05 DIAGNOSIS — I10 Essential (primary) hypertension: Secondary | ICD-10-CM

## 2018-06-05 LAB — UA/M W/RFLX CULTURE, ROUTINE
BILIRUBIN UA: NEGATIVE
Glucose, UA: NEGATIVE
KETONES UA: NEGATIVE
NITRITE UA: NEGATIVE
Protein, UA: NEGATIVE
RBC UA: NEGATIVE
Specific Gravity, UA: 1.01 (ref 1.005–1.030)
UUROB: 0.2 mg/dL (ref 0.2–1.0)
pH, UA: 7 (ref 5.0–7.5)

## 2018-06-05 LAB — MICROSCOPIC EXAMINATION
Bacteria, UA: NONE SEEN
RBC, UA: NONE SEEN /hpf (ref 0–2)

## 2018-06-05 NOTE — Progress Notes (Signed)
BP 129/85 (BP Location: Left Arm, Cuff Size: Large)   Pulse 92   Temp 99.2 F (37.3 C) (Oral)   Ht 5\' 3"  (1.6 m)   Wt 230 lb 14.4 oz (104.7 kg)   LMP  (LMP Unknown)   SpO2 98%   BMI 40.90 kg/m    Subjective:    Patient ID: Heather James, female    DOB: 07-21-1950, 67 y.o.   MRN: [id number]  HPI: Heather James is a 67 y.o. female presenting on 06/05/2018 for comprehensive medical examination. Current medical complaints include:none  Doing very well overall. Taking her HCTZ faithfully without side effects. BPs staying WNL. Denies CP, SOB, HAs, dizziness.   Taking evista and calcium vit d supplements for osteopenia. Tolerating well. UTD on DXA.   Had small right breast cyst found on mammogram in Jan 2019, calling today to get follow up scans set up through [facility]. No changes.   Had elevated LDL in 2018, not currently on medications for cholesterol management. Not following any particular diet.   She currently lives with: Menopausal Symptoms: no  Depression Screen done today and results listed below:  Depression screen [facility] 2/9 06/01/2018 01/23/2018 05/27/2017 05/24/2016 05/23/2015  Decreased Interest 0 0 0 0 0  Down, Depressed, Hopeless 0 0 0 0 0  PHQ - 2 Score 0 0 0 0 0  Altered sleeping - - - 0 -  Tired, decreased energy - - - 0 -  Change in appetite - - - 0 -  Feeling bad or failure about yourself  - - - 0 -  Trouble concentrating - - - 0 -  Moving slowly or fidgety/restless - - - 0 -  Suicidal thoughts - - - 0 -  PHQ-9 Score - - - 0 -    The patient does not have a history of falls. I did not complete a risk assessment for falls. A plan of care for falls was not documented.   Past Medical History:  Past Medical History:  Diagnosis Date  . Cancer ([place])    skin ca on leg  . CSF leak from ear   . Hypertension   . Obesity   . Osteopenia     Surgical History:  Past Surgical History:  Procedure Laterality Date  . BREAST BIOPSY Left 11/21/96   neg  .  BREAST SURGERY Left    cyst removed  . COLONOSCOPY WITH PROPOFOL N/A 03/14/2018   Procedure: COLONOSCOPY WITH PROPOFOL;  Surgeon: Toledo, Benay Pike, MD;  Location: [facility] ENDOSCOPY;  Service: Gastroenterology;  Laterality: N/A;  . FACIAL NERVE SURGERY    . PILONIDAL CYST EXCISION    . SKIN CANCER EXCISION Right    right leg, April 2017    Medications:  Current Outpatient Medications on File Prior to Visit  Medication Sig  . albuterol (PROVENTIL HFA;VENTOLIN HFA) 108 (90 Base) MCG/ACT inhaler Inhale 2 puffs into the lungs every 6 (six) hours as needed for wheezing or shortness of breath.  Marland Kitchen aspirin 81 MG tablet Take 81 mg by mouth daily.  . cyclobenzaprine (FLEXERIL) 5 MG tablet Take 1 tablet (5 mg total) by mouth 3 (three) times daily as needed for muscle spasms.  . diclofenac sodium (VOLTAREN) 1 % GEL Apply 4 g topically 4 (four) times daily.  . hydrochlorothiazide (HYDRODIURIL) 50 MG tablet Take 1 tablet (50 mg total) by mouth every other day.  . mometasone (NASONEX) 50 MCG/ACT nasal spray Place 2 sprays into the nose daily.  Marland Kitchen  NEOMYCIN-POLYMYXIN-HYDROCORTISONE (CORTISPORIN) 1 % SOLN OTIC solution Apply 1-2 drops to toe BID after soaking  . potassium chloride SA (K-DUR,KLOR-CON) 20 MEQ tablet TK 1 T PO QD WF  . raloxifene (EVISTA) 60 MG tablet Take 1 tablet (60 mg total) by mouth daily.   No current facility-administered medications on file prior to visit.     Allergies:  No Known Allergies  Social History:  Social History   Socioeconomic History  . Marital status: Divorced    Spouse name: Not on file  . Number of children: Not on file  . Years of education: 28  . Highest education level: 12th grade  Occupational History  . Not on file  Social Needs  . Financial resource strain: Not hard at all  . Food insecurity:    Worry: Never true    Inability: Never true  . Transportation needs:    Medical: No    Non-medical: No  Tobacco Use  . Smoking status: Never Smoker  .  Smokeless tobacco: Never Used  Substance and Sexual Activity  . Alcohol use: No    Alcohol/week: 0.0 standard drinks  . Drug use: No  . Sexual activity: Never  Lifestyle  . Physical activity:    Days per week: 2 days    Minutes per session: 40 min  . Stress: Not at all  Relationships  . Social connections:    Talks on phone: More than three times a week    Gets together: Twice a week    Attends religious service: More than 4 times per year    Active member of club or organization: No    Attends meetings of clubs or organizations: Never    Relationship status: Never married  . Intimate partner violence:    Fear of current or ex partner: No    Emotionally abused: No    Physically abused: No    Forced sexual activity: No  Other Topics Concern  . Not on file  Social History Narrative  . Not on file   Social History   Tobacco Use  Smoking Status Never Smoker  Smokeless Tobacco Never Used   Social History   Substance and Sexual Activity  Alcohol Use No  . Alcohol/week: 0.0 standard drinks    Family History:  Family History  Problem Relation Age of Onset  . Heart disease Mother   . Diabetes Mother   . Hypertension Mother   . Breast cancer Mother 50  . Heart disease Father   . Diabetes Father   . Hypertension Brother   . Cancer Maternal Grandmother        spine  . Heart disease Maternal Grandfather   . Heart disease Paternal Grandmother   . Emphysema Paternal Grandfather     Past medical history, surgical history, medications, allergies, family history and social history reviewed with patient today and changes made to appropriate areas of the chart.   Review of Systems - General ROS: negative Psychological ROS: negative Ophthalmic ROS: negative ENT ROS: negative Allergy and Immunology ROS: negative Hematological and Lymphatic ROS: negative Endocrine ROS: negative Breast ROS: negative for breast lumps Respiratory ROS: no cough, shortness of breath, or  wheezing Cardiovascular ROS: no chest pain or dyspnea on exertion Gastrointestinal ROS: no abdominal pain, change in bowel habits, or black or bloody stools Genito-Urinary ROS: no dysuria, trouble voiding, or hematuria Musculoskeletal ROS: negative Neurological ROS: no TIA or stroke symptoms Dermatological ROS: negative All other ROS negative except what is listed above and  in the HPI.      Objective:    BP 129/85 (BP Location: Left Arm, Cuff Size: Large)   Pulse 92   Temp 99.2 F (37.3 C) (Oral)   Ht 5\' 3"  (1.6 m)   Wt 230 lb 14.4 oz (104.7 kg)   LMP  (LMP Unknown)   SpO2 98%   BMI 40.90 kg/m   Wt Readings from Last 3 Encounters:  06/05/18 230 lb 14.4 oz (104.7 kg)  06/01/18 235 lb (106.6 kg)  03/14/18 229 lb (103.9 kg)    Physical Exam  Constitutional: She is oriented to person, place, and time. She appears well-developed and well-nourished. No distress.  HENT:  Head: Atraumatic.  Right Ear: External ear normal.  Left Ear: External ear normal.  Nose: Nose normal.  Mouth/Throat: Oropharynx is clear and moist. No oropharyngeal exudate.  Eyes: Pupils are equal, round, and reactive to light. Conjunctivae are normal. No scleral icterus.  Neck: Normal range of motion. Neck supple. No thyromegaly present.  Cardiovascular: Normal rate, regular rhythm, normal heart sounds and intact distal pulses.  Pulmonary/Chest: Effort normal and breath sounds normal. No respiratory distress. Right breast exhibits no mass, no nipple discharge, no skin change and no tenderness. Left breast exhibits no mass, no nipple discharge, no skin change and no tenderness.  Abdominal: Soft. Bowel sounds are normal. She exhibits no mass. There is no tenderness.  Genitourinary:  Genitourinary Comments: Exam declined  Musculoskeletal: Normal range of motion. She exhibits no edema or tenderness.  Lymphadenopathy:    She has no cervical adenopathy.    She has no axillary adenopathy.  Neurological: She is  alert and oriented to person, place, and time. No cranial nerve deficit.  Skin: Skin is warm and dry. No rash noted.  Psychiatric: She has a normal mood and affect. Her behavior is normal.  Nursing note and vitals reviewed.   Results for orders placed or performed during the hospital encounter of 03/14/18  Surgical pathology  Result Value Ref Range   SURGICAL PATHOLOGY      Surgical Pathology CASE: [id number] PATIENT: Hether Allman Surgical Pathology Report     SPECIMEN SUBMITTED: A. Colon polyp, transverse; hot snare  CLINICAL HISTORY: None provided  PRE-OPERATIVE DIAGNOSIS: Personal HX of colon polyps  POST-OPERATIVE DIAGNOSIS: Internal hemorrhoids, diverticulosis, colon polyp     DIAGNOSIS: A. COLON POLYP, TRANSVERSE; HOT SNARE: - TUBULAR ADENOMA. - NEGATIVE FOR HIGH-GRADE DYSPLASIA AND MALIGNANCY.   GROSS DESCRIPTION: A. Labeled: Hot snare polyp transverse colon Received: In formalin Tissue fragment(s): 1 Size: 0.6 cm Description: Pink-tan polypoid fragment, marked green at the base Entirely submitted in one cassette.     Final Diagnosis performed by Quay Burow, MD.   Electronically signed 03/15/2018 3:58:46PM The electronic signature indicates that the named Attending Pathologist has evaluated the specimen  Technical component performed at [facility], [address] [place], [place] [address] Lab: [phone] Sharma Covert r: Rush Farmer, MD, MMM  Professional component performed at [facility], [facility], [place], [place], [place] [address] Lab: [phone] Dir: Dellia Nims. Reuel Derby, MD       Assessment & Plan:   Problem List Items Addressed This Visit      Cardiovascular and Mediastinum   Hypertension - Primary    Stable and WNL, continue current regimen      Relevant Orders   CBC with Differential/Platelet   Comprehensive metabolic panel   UA/M w/rflx Culture, Routine     Musculoskeletal and Integument   Osteopenia  UTD  on DXA, continue current regimen        Other   Morbid obesity ([place])    Diet and exercise reviewed      BMI 40.0-44.9, adult ([place])    Diet and exercise reviewed.       Hypercholesteremia    Recheck lipids, adjust if needed. Lifestyle modifications reviewed      Relevant Orders   Lipid Panel w/o Chol/HDL Ratio    Other Visit Diagnoses    Annual physical exam           Follow up plan: Return in about 6 months (around 12/05/2018) for BP.   LABORATORY TESTING:  - Pap smear: not applicable  IMMUNIZATIONS:   - Tdap: Tetanus vaccination status reviewed: last tetanus booster within 10 years. - Influenza: Up to date - Pneumovax: Up to date - Prevnar: Up to date - HPV: Not applicable - Zostavax vaccine: Up to date  SCREENING: -Mammogram: Up to date  - Colonoscopy: Up to date  - Bone Density: Up to date   PATIENT COUNSELING:   Advised to take 1 mg of folate supplement per day if capable of pregnancy.   Sexuality: Discussed sexually transmitted diseases, partner selection, use of condoms, avoidance of unintended pregnancy  and contraceptive alternatives.   Advised to avoid cigarette smoking.  I discussed with the patient that most people either abstain from alcohol or drink within safe limits (<=14/week and <=4 drinks/occasion for males, <=7/weeks and <= 3 drinks/occasion for females) and that the risk for alcohol disorders and other health effects rises proportionally with the number of drinks per week and how often a drinker exceeds daily limits.  Discussed cessation/primary prevention of drug use and availability of treatment for abuse.   Diet: Encouraged to adjust caloric intake to maintain  or achieve ideal body weight, to reduce intake of dietary saturated fat and total fat, to limit sodium intake by avoiding high sodium foods and not adding table salt, and to maintain adequate dietary potassium and calcium preferably from fresh fruits, vegetables, and low-fat dairy  products.    stressed the importance of regular exercise  Injury prevention: Discussed safety belts, safety helmets, smoke detector, smoking near bedding or upholstery.   Dental health: Discussed importance of regular tooth brushing, flossing, and dental visits.    NEXT PREVENTATIVE PHYSICAL DUE IN 1 YEAR. Return in about 6 months (around 12/05/2018) for BP.

## 2018-06-05 NOTE — Assessment & Plan Note (Signed)
UTD on DXA, continue current regimen

## 2018-06-05 NOTE — Assessment & Plan Note (Signed)
Diet and exercise reviewed 

## 2018-06-05 NOTE — Assessment & Plan Note (Signed)
Recheck lipids, adjust if needed. Lifestyle modifications reviewed

## 2018-06-05 NOTE — Assessment & Plan Note (Signed)
Stable and WNL, continue current regimen 

## 2018-06-06 LAB — COMPREHENSIVE METABOLIC PANEL
ALK PHOS: 111 IU/L (ref 39–117)
ALT: 23 IU/L (ref 0–32)
AST: 21 IU/L (ref 0–40)
Albumin/Globulin Ratio: 1.9 (ref 1.2–2.2)
Albumin: 5 g/dL — ABNORMAL HIGH (ref 3.6–4.8)
BUN/Creatinine Ratio: 14 (ref 12–28)
BUN: 17 mg/dL (ref 8–27)
Bilirubin Total: 1 mg/dL (ref 0.0–1.2)
CHLORIDE: 102 mmol/L (ref 96–106)
CO2: 27 mmol/L (ref 20–29)
CREATININE: 1.21 mg/dL — AB (ref 0.57–1.00)
Calcium: 11.1 mg/dL — ABNORMAL HIGH (ref 8.7–10.3)
GFR calc Af Amer: 53 mL/min/{1.73_m2} — ABNORMAL LOW (ref >59)
GFR calc non Af Amer: 46 mL/min/{1.73_m2} — ABNORMAL LOW (ref >59)
GLOBULIN, TOTAL: 2.6 g/dL (ref 1.5–4.5)
Glucose: 97 mg/dL (ref 65–99)
POTASSIUM: 4.7 mmol/L (ref 3.5–5.2)
SODIUM: 146 mmol/L — AB (ref 134–144)
Total Protein: 7.6 g/dL (ref 6.0–8.5)

## 2018-06-06 LAB — CBC WITH DIFFERENTIAL/PLATELET
BASOS ABS: 0.1 10*3/uL (ref 0.0–0.2)
Basos: 1 %
EOS (ABSOLUTE): 0.1 10*3/uL (ref 0.0–0.4)
EOS: 1 %
HEMATOCRIT: 45.5 % (ref 34.0–46.6)
Hemoglobin: 15.3 g/dL (ref 11.1–15.9)
Immature Grans (Abs): 0 10*3/uL (ref 0.0–0.1)
Immature Granulocytes: 0 %
LYMPHS ABS: 1.7 10*3/uL (ref 0.7–3.1)
Lymphs: 22 %
MCH: 30.7 pg (ref 26.6–33.0)
MCHC: 33.6 g/dL (ref 31.5–35.7)
MCV: 91 fL (ref 79–97)
MONOS ABS: 0.5 10*3/uL (ref 0.1–0.9)
Monocytes: 6 %
Neutrophils Absolute: 5.4 10*3/uL (ref 1.4–7.0)
Neutrophils: 70 %
Platelets: 357 10*3/uL (ref 150–450)
RBC: 4.98 x10E6/uL (ref 3.77–5.28)
RDW: 13 % (ref 12.3–15.4)
WBC: 7.7 10*3/uL (ref 3.4–10.8)

## 2018-06-06 LAB — LIPID PANEL W/O CHOL/HDL RATIO
CHOLESTEROL TOTAL: 222 mg/dL — AB (ref 100–199)
HDL: 68 mg/dL (ref >39)
LDL Calculated: 134 mg/dL — ABNORMAL HIGH (ref 0–99)
TRIGLYCERIDES: 98 mg/dL (ref 0–149)
VLDL Cholesterol Cal: 20 mg/dL (ref 5–40)

## 2018-06-12 ENCOUNTER — Telehealth: Payer: Self-pay

## 2018-06-12 NOTE — Telephone Encounter (Signed)
Copied from [place] [id number]. Topic: General - Other >> Jun 12, 2018 11:34 AM Percell Belt A wrote: Reason for CRM:   Pt called in and stated she rec'd a message on mychart that said that a low dose of cholesterol meds was going to be called in.  She stated her drug store has not gotten anything.  Please advise   Pharmacy - [facility] in [place] to provider. I do not see a cholesterol medication on patient's med list.

## 2018-06-12 NOTE — Telephone Encounter (Signed)
Left for PCP on her return

## 2018-06-13 MED ORDER — ATORVASTATIN CALCIUM 20 MG PO TABS: 20.0000 mg | ORAL_TABLET | Freq: Every day | ORAL | 1 refills | Status: DC

## 2018-06-13 NOTE — Telephone Encounter (Signed)
RX sent - sorry about that

## 2018-06-15 DIAGNOSIS — M779 Enthesopathy, unspecified: Secondary | ICD-10-CM | POA: Diagnosis not present

## 2018-06-15 DIAGNOSIS — M65311 Trigger thumb, right thumb: Secondary | ICD-10-CM | POA: Diagnosis not present

## 2018-06-15 DIAGNOSIS — M79644 Pain in right finger(s): Secondary | ICD-10-CM | POA: Diagnosis not present

## 2018-07-06 ENCOUNTER — Ambulatory Visit
Admission: RE | Admit: 2018-07-06 | Discharge: 2018-07-06 | Disposition: A | Discharge status: Home / Self Care | Payer: PPO | Source: Ambulatory Visit | Attending: Family Medicine | Admitting: Family Medicine

## 2018-07-06 DIAGNOSIS — Z1231 Encounter for screening mammogram for malignant neoplasm of breast: Secondary | ICD-10-CM | POA: Diagnosis not present

## 2018-09-05 ENCOUNTER — Other Ambulatory Visit: Payer: Self-pay | Admitting: Family Medicine

## 2018-09-05 MED ORDER — DICLOFENAC SODIUM 1 % TD GEL: 4.0000 g | Freq: Four times a day (QID) | TRANSDERMAL | 0 refills | Status: AC

## 2018-09-05 NOTE — Telephone Encounter (Signed)
Copied from [place] [id number]. Topic: Quick Communication - Rx Refill/Question >> Sep 05, 2018  8:35 AM Andria Frames L wrote: Medication: diclofenac sodium (VOLTAREN) 1 % GEL [[id number]]   Has the patient contacted their pharmacy? Yes/told to call office  Preferred Pharmacy (with phone number or street name): [id number] - [address], [place] [phone] (Phone) [phone] (Fax)   Agent: Please be advised that RX refills may take up to 3 business days. We ask that you follow-up with your pharmacy.

## 2018-09-11 DIAGNOSIS — M65311 Trigger thumb, right thumb: Secondary | ICD-10-CM | POA: Diagnosis not present

## 2018-09-11 DIAGNOSIS — Z6841 Body Mass Index (BMI) 40.0 and over, adult: Secondary | ICD-10-CM | POA: Diagnosis not present

## 2018-10-02 DIAGNOSIS — M65311 Trigger thumb, right thumb: Secondary | ICD-10-CM | POA: Diagnosis not present

## 2018-10-02 DIAGNOSIS — Z1231 Encounter for screening mammogram for malignant neoplasm of breast: Secondary | ICD-10-CM | POA: Diagnosis not present

## 2018-11-08 ENCOUNTER — Encounter: Payer: Self-pay | Admitting: Family Medicine

## 2018-11-08 ENCOUNTER — Ambulatory Visit (INDEPENDENT_AMBULATORY_CARE_PROVIDER_SITE_OTHER): Payer: PPO | Admitting: Family Medicine

## 2018-11-08 ENCOUNTER — Ambulatory Visit: Payer: Self-pay | Admitting: *Deleted

## 2018-11-08 ENCOUNTER — Other Ambulatory Visit: Payer: Self-pay

## 2018-11-08 DIAGNOSIS — J069 Acute upper respiratory infection, unspecified: Secondary | ICD-10-CM

## 2018-11-08 DIAGNOSIS — E7800 Pure hypercholesterolemia, unspecified: Secondary | ICD-10-CM | POA: Diagnosis not present

## 2018-11-08 DIAGNOSIS — I10 Essential (primary) hypertension: Secondary | ICD-10-CM

## 2018-11-08 NOTE — Progress Notes (Signed)
LMP  (LMP Unknown)    Subjective:    Patient ID: Heather James, female    DOB: Oct 08, 1950, 68 y.o.   MRN: [id number]  HPI: Heather James is a 68 y.o. female  Concerned about coronavirus  Telemedicine using audio/video telecommunications for a synchronous communication visit. Today's visit due to COVID-19 isolation precautions I connected with and verified that I am speaking with the correct person using two identifiers.   I discussed the limitations, risks, security and privacy concerns of performing an evaluation and management service by telecommunication and the availability of in person appointments. I also discussed with the patient that there may be a patient responsible charge related to this service. The patient expressed understanding and agreed to proceed. The patient's location is home. I am at home.  See call screener's notes for details patient taking Tylenol and feeling better with no further fever symptoms no cough no shortness of breath.  Reviewed other symptoms patient is negative but has some mild URI type symptoms. Concerned because of COVID-19 and risk factors. Patient with no known exposure to COVID-19.  No known travel. Patient's other medical concerns are stable as is medication with no cholesterol hypertension issues. Relevant past medical, surgical, family and social history reviewed and updated as indicated. Interim medical history since our last visit reviewed. Allergies and medications reviewed and updated.  Review of Systems  Constitutional: Negative.   Respiratory: Negative.   Cardiovascular: Negative.     Per HPI unless specifically indicated above     Objective:    LMP  (LMP Unknown)   Wt Readings from Last 3 Encounters:  06/05/18 230 lb 14.4 oz (104.7 kg)  06/01/18 235 lb (106.6 kg)  03/14/18 229 lb (103.9 kg)    Physical Exam  Results for orders placed or performed in visit on 06/05/18  Microscopic Examination  Result Value  Ref Range   WBC, UA 6-10 (A) 0 - 5 /hpf   RBC, UA None seen 0 - 2 /hpf   Epithelial Cells (non renal) 0-10 0 - 10 /hpf   Bacteria, UA None seen None seen/Few  CBC with Differential/Platelet  Result Value Ref Range   WBC 7.7 3.4 - 10.8 x10E3/uL   RBC 4.98 3.77 - 5.28 x10E6/uL   Hemoglobin 15.3 11.1 - 15.9 g/dL   Hematocrit 45.5 34.0 - 46.6 %   MCV 91 79 - 97 fL   MCH 30.7 26.6 - 33.0 pg   MCHC 33.6 31.5 - 35.7 g/dL   RDW 13.0 12.3 - 15.4 %   Platelets 357 150 - 450 x10E3/uL   Neutrophils 70 Not Estab. %   Lymphs 22 Not Estab. %   Monocytes 6 Not Estab. %   Eos 1 Not Estab. %   Basos 1 Not Estab. %   Neutrophils Absolute 5.4 1.4 - 7.0 x10E3/uL   Lymphocytes Absolute 1.7 0.7 - 3.1 x10E3/uL   Monocytes Absolute 0.5 0.1 - 0.9 x10E3/uL   EOS (ABSOLUTE) 0.1 0.0 - 0.4 x10E3/uL   Basophils Absolute 0.1 0.0 - 0.2 x10E3/uL   Immature Granulocytes 0 Not Estab. %   Immature Grans (Abs) 0.0 0.0 - 0.1 x10E3/uL  Comprehensive metabolic panel  Result Value Ref Range   Glucose 97 65 - 99 mg/dL   BUN 17 8 - 27 mg/dL   Creatinine, Ser 1.21 (H) 0.57 - 1.00 mg/dL   GFR calc non Af Amer 46 (L) >59 mL/min/1.73   GFR calc Af Amer 53 (L) >59 mL/min/1.73  BUN/Creatinine Ratio 14 12 - 28   Sodium 146 (H) 134 - 144 mmol/L   Potassium 4.7 3.5 - 5.2 mmol/L   Chloride 102 96 - 106 mmol/L   CO2 27 20 - 29 mmol/L   Calcium 11.1 (H) 8.7 - 10.3 mg/dL   Total Protein 7.6 6.0 - 8.5 g/dL   Albumin 5.0 (H) 3.6 - 4.8 g/dL   Globulin, Total 2.6 1.5 - 4.5 g/dL   Albumin/Globulin Ratio 1.9 1.2 - 2.2   Bilirubin Total 1.0 0.0 - 1.2 mg/dL   Alkaline Phosphatase 111 39 - 117 IU/L   AST 21 0 - 40 IU/L   ALT 23 0 - 32 IU/L  UA/M w/rflx Culture, Routine  Result Value Ref Range   Specific Gravity, UA 1.010 1.005 - 1.030   pH, UA 7.0 5.0 - 7.5   Color, UA Yellow Yellow   Appearance Ur Cloudy (A) Clear   Leukocytes, UA 3+ (A) Negative   Protein, UA Negative Negative/Trace   Glucose, UA Negative Negative    Ketones, UA Negative Negative   RBC, UA Negative Negative   Bilirubin, UA Negative Negative   Urobilinogen, Ur 0.2 0.2 - 1.0 mg/dL   Nitrite, UA Negative Negative   Microscopic Examination See below:    Urinalysis Reflex Cascade continues   Lipid Panel w/o Chol/HDL Ratio  Result Value Ref Range   Cholesterol, Total 222 (H) 100 - 199 mg/dL   Triglycerides 98 0 - 149 mg/dL   HDL 68 >39 mg/dL   VLDL Cholesterol Cal 20 5 - 40 mg/dL   LDL Calculated 134 (H) 0 - 99 mg/dL      Assessment & Plan:   Problem List Items Addressed This Visit      Cardiovascular and Mediastinum   Hypertension    The current medical regimen is effective;  continue present plan and medications.         Other   Hypercholesteremia    The current medical regimen is effective;  continue present plan and medications.        Other Visit Diagnoses    Viral upper respiratory tract infection    -  Primary    Discussed viral symptoms and URI and COVID-19 will observe if change of symptoms or concerns will further evaluate.  I discussed the assessment and treatment plan with the patient. The patient was provided an opportunity to ask questions and all were answered. The patient agreed with the plan and demonstrated an understanding of the instructions.   The patient was advised to call back or seek an in-person evaluation if the symptoms worsen or if the condition fails to improve as anticipated.   I provided 21+ minutes of time during this encounter.  Follow up plan: Return if symptoms worsen or fail to improve, for As scheduled.

## 2018-11-08 NOTE — Assessment & Plan Note (Signed)
The current medical regimen is effective;  continue present plan and medications.  

## 2018-11-08 NOTE — Telephone Encounter (Signed)
Pt called with complaints of upset stomach, body aches and chills on 11/07/2018 which have now resolved; her temp on 101 11/07/2018 and 500 mg tylenol at  1400 on 11/07/2018 and 0930 11/08/2018; this morning her temp was 99, and her chills have decreased; the pt answers no to all questions on [facility] Corona Virus Eval; she states that she does not wear a mask at work, but does wear one to the grocery store; the pt is  concerned about her fever; recommendations made per nurse triage protocol; pt transferred to [place] for scheduling.   Reason for Disposition . HIGH RISK patient (e.g., age > 31 years, diabetes, heart or lung disease, weak immune system)  Answer Assessment - Initial Assessment Questions 1. COVID-19 DIAGNOSIS: "Who made your Coronavirus (COVID-19) diagnosis?" "Was it confirmed by a positive lab test?" If not diagnosed by a HCP, ask "Are there lots of cases (community spread) where you live?" (See public health department website, if unsure)   * MAJOR community spread: high number of cases; numbers of cases are increasing; many people hospitalized.   * MINOR community spread: low number of cases; not increasing; few or no people hospitalized     major 2. ONSET: "When did the COVID-19 symptoms start?"      11/07/2018 3. WORST SYMPTOM: "What is your worst symptom?" (e.g., cough, fever, shortness of breath, muscle aches)   fever 4. COUGH: "Do you have a cough?" If so, ask: "How bad is the cough?"       no 5. FEVER: "Do you have a fever?" If so, ask: "What is your temperature, how was it measured, and when did it start?"     101 on 11/07/2018 and 99 on 11/08/2018 6. RESPIRATORY STATUS: "Describe your breathing?" (e.g., shortness of breath, wheezing, unable to speak)      no 7. BETTER-SAME-WORSE: "Are you getting better, staying the same or getting worse compared to yesterday?"  If getting worse, ask, "In what way?"     better 8. HIGH RISK DISEASE: "Do you have any chronic medical problems?"  (e.g., asthma, heart or lung disease, weak immune system, etc.)     High blood pressure 9. PREGNANCY: "Is there any chance you are pregnant?" "When was your last menstrual period?"     No, no 10. OTHER SYMPTOMS: "Do you have any other symptoms?"  (e.g., runny nose, headache, sore throat, loss of smell)       Body aches and chill which have resolved  Protocols used: CORONAVIRUS (COVID-19) DIAGNOSED OR SUSPECTED-A-AH

## 2018-12-05 ENCOUNTER — Ambulatory Visit: Payer: PPO | Admitting: Family Medicine

## 2018-12-07 ENCOUNTER — Ambulatory Visit (INDEPENDENT_AMBULATORY_CARE_PROVIDER_SITE_OTHER): Payer: PPO | Admitting: Family Medicine

## 2018-12-07 ENCOUNTER — Other Ambulatory Visit: Payer: Self-pay

## 2018-12-07 ENCOUNTER — Encounter: Payer: Self-pay | Admitting: Family Medicine

## 2018-12-07 VITALS — BP 123/72 | HR 82 | Temp 98.8°F | Ht 63.0 in | Wt 234.0 lb

## 2018-12-07 DIAGNOSIS — E7800 Pure hypercholesterolemia, unspecified: Secondary | ICD-10-CM

## 2018-12-07 DIAGNOSIS — I10 Essential (primary) hypertension: Secondary | ICD-10-CM | POA: Diagnosis not present

## 2018-12-07 MED ORDER — ATORVASTATIN CALCIUM 20 MG PO TABS: 20.0000 mg | ORAL_TABLET | Freq: Every day | ORAL | 1 refills | Status: DC

## 2018-12-07 MED ORDER — HYDROCHLOROTHIAZIDE 50 MG PO TABS: 50.0000 mg | ORAL_TABLET | ORAL | 1 refills | Status: DC

## 2018-12-07 NOTE — Progress Notes (Signed)
BP 123/72   Pulse 82   Temp 98.8 F (37.1 C) (Oral)   Ht 5\' 3"  (1.6 m)   Wt 234 lb (106.1 kg)   LMP  (LMP Unknown)   SpO2 97%   BMI 41.45 kg/m    Subjective:    Patient ID: Heather James, female    DOB: 1950/07/22, 68 y.o.   MRN: [id number]  HPI: Sama Arauz is a 68 y.o. female  Chief Complaint  Patient presents with  . Follow-up  . Hypertension  . Hyperlipidemia   Here today for 6 month f/u chronic conditions.   Home BPs have been 110-120s/70s when checked. Taking medication faithfully without side effects. Denies CP, SOB, HAs, dizziness. Trying to follow DASH diet.   Tolerating atorvastatin well for HLD. No myalgias, claudication. Trying to watch what she eats and stay active.   No new concerns today.   Relevant past medical, surgical, family and social history reviewed and updated as indicated. Interim medical history since our last visit reviewed. Allergies and medications reviewed and updated.  Review of Systems  Per HPI unless specifically indicated above     Objective:    BP 123/72   Pulse 82   Temp 98.8 F (37.1 C) (Oral)   Ht 5\' 3"  (1.6 m)   Wt 234 lb (106.1 kg)   LMP  (LMP Unknown)   SpO2 97%   BMI 41.45 kg/m   Wt Readings from Last 3 Encounters:  12/07/18 234 lb (106.1 kg)  06/05/18 230 lb 14.4 oz (104.7 kg)  06/01/18 235 lb (106.6 kg)    Physical Exam Vitals signs and nursing note reviewed.  Constitutional:      Appearance: Normal appearance. She is not ill-appearing.  HENT:     Head: Atraumatic.  Eyes:     Extraocular Movements: Extraocular movements intact.     Conjunctiva/sclera: Conjunctivae normal.  Neck:     Musculoskeletal: Normal range of motion and neck supple.  Cardiovascular:     Rate and Rhythm: Normal rate and regular rhythm.     Heart sounds: Normal heart sounds.  Pulmonary:     Effort: Pulmonary effort is normal.     Breath sounds: Normal breath sounds.  Musculoskeletal: Normal range of motion.  Skin:   General: Skin is warm and dry.  Neurological:     Mental Status: She is alert and oriented to person, place, and time.  Psychiatric:        Mood and Affect: Mood normal.        Thought Content: Thought content normal.        Judgment: Judgment normal.     Results for orders placed or performed in visit on 06/05/18  Microscopic Examination   URINE  Result Value Ref Range   WBC, UA 6-10 (A) 0 - 5 /hpf   RBC, UA None seen 0 - 2 /hpf   Epithelial Cells (non renal) 0-10 0 - 10 /hpf   Bacteria, UA None seen None seen/Few  CBC with Differential/Platelet  Result Value Ref Range   WBC 7.7 3.4 - 10.8 x10E3/uL   RBC 4.98 3.77 - 5.28 x10E6/uL   Hemoglobin 15.3 11.1 - 15.9 g/dL   Hematocrit 45.5 34.0 - 46.6 %   MCV 91 79 - 97 fL   MCH 30.7 26.6 - 33.0 pg   MCHC 33.6 31.5 - 35.7 g/dL   RDW 13.0 12.3 - 15.4 %   Platelets 357 150 - 450 x10E3/uL   Neutrophils 70  Not Estab. %   Lymphs 22 Not Estab. %   Monocytes 6 Not Estab. %   Eos 1 Not Estab. %   Basos 1 Not Estab. %   Neutrophils Absolute 5.4 1.4 - 7.0 x10E3/uL   Lymphocytes Absolute 1.7 0.7 - 3.1 x10E3/uL   Monocytes Absolute 0.5 0.1 - 0.9 x10E3/uL   EOS (ABSOLUTE) 0.1 0.0 - 0.4 x10E3/uL   Basophils Absolute 0.1 0.0 - 0.2 x10E3/uL   Immature Granulocytes 0 Not Estab. %   Immature Grans (Abs) 0.0 0.0 - 0.1 x10E3/uL  Comprehensive metabolic panel  Result Value Ref Range   Glucose 97 65 - 99 mg/dL   BUN 17 8 - 27 mg/dL   Creatinine, Ser 1.21 (H) 0.57 - 1.00 mg/dL   GFR calc non Af Amer 46 (L) >59 mL/min/1.73   GFR calc Af Amer 53 (L) >59 mL/min/1.73   BUN/Creatinine Ratio 14 12 - 28   Sodium 146 (H) 134 - 144 mmol/L   Potassium 4.7 3.5 - 5.2 mmol/L   Chloride 102 96 - 106 mmol/L   CO2 27 20 - 29 mmol/L   Calcium 11.1 (H) 8.7 - 10.3 mg/dL   Total Protein 7.6 6.0 - 8.5 g/dL   Albumin 5.0 (H) 3.6 - 4.8 g/dL   Globulin, Total 2.6 1.5 - 4.5 g/dL   Albumin/Globulin Ratio 1.9 1.2 - 2.2   Bilirubin Total 1.0 0.0 - 1.2 mg/dL    Alkaline Phosphatase 111 39 - 117 IU/L   AST 21 0 - 40 IU/L   ALT 23 0 - 32 IU/L  UA/M w/rflx Culture, Routine   Specimen: Urine   URINE  Result Value Ref Range   Specific Gravity, UA 1.010 1.005 - 1.030   pH, UA 7.0 5.0 - 7.5   Color, UA Yellow Yellow   Appearance Ur Cloudy (A) Clear   Leukocytes, UA 3+ (A) Negative   Protein, UA Negative Negative/Trace   Glucose, UA Negative Negative   Ketones, UA Negative Negative   RBC, UA Negative Negative   Bilirubin, UA Negative Negative   Urobilinogen, Ur 0.2 0.2 - 1.0 mg/dL   Nitrite, UA Negative Negative   Microscopic Examination See below:    Urinalysis Reflex Cascade continues   Lipid Panel w/o Chol/HDL Ratio  Result Value Ref Range   Cholesterol, Total 222 (H) 100 - 199 mg/dL   Triglycerides 98 0 - 149 mg/dL   HDL 68 >39 mg/dL   VLDL Cholesterol Cal 20 5 - 40 mg/dL   LDL Calculated 134 (H) 0 - 99 mg/dL      Assessment & Plan:   Problem List Items Addressed This Visit      Cardiovascular and Mediastinum   Hypertension - Primary    Stable and WNL, continue current regimen      Relevant Medications   atorvastatin (LIPITOR) 20 MG tablet   hydrochlorothiazide (HYDRODIURIL) 50 MG tablet   Other Relevant Orders   Comprehensive metabolic panel     Other   Hypercholesteremia    Recheck lipids, adjust as needed. Continue good lifestyle modifications      Relevant Medications   atorvastatin (LIPITOR) 20 MG tablet   hydrochlorothiazide (HYDRODIURIL) 50 MG tablet   Other Relevant Orders   Lipid Panel w/o Chol/HDL Ratio       Follow up plan: Return in about 6 months (around 06/08/2019) for CPE.

## 2018-12-08 LAB — COMPREHENSIVE METABOLIC PANEL
ALT: 23 IU/L (ref 0–32)
AST: 22 IU/L (ref 0–40)
Albumin/Globulin Ratio: 2.2 (ref 1.2–2.2)
Albumin: 4.6 g/dL (ref 3.8–4.8)
Alkaline Phosphatase: 117 IU/L (ref 39–117)
BUN/Creatinine Ratio: 13 (ref 12–28)
BUN: 12 mg/dL (ref 8–27)
Bilirubin Total: 1.1 mg/dL (ref 0.0–1.2)
CO2: 26 mmol/L (ref 20–29)
Calcium: 9.9 mg/dL (ref 8.7–10.3)
Chloride: 105 mmol/L (ref 96–106)
Creatinine, Ser: 0.94 mg/dL (ref 0.57–1.00)
GFR calc Af Amer: 72 mL/min/{1.73_m2} (ref >59)
GFR calc non Af Amer: 63 mL/min/{1.73_m2} (ref >59)
Globulin, Total: 2.1 g/dL (ref 1.5–4.5)
Glucose: 95 mg/dL (ref 65–99)
Potassium: 4.6 mmol/L (ref 3.5–5.2)
Sodium: 148 mmol/L — ABNORMAL HIGH (ref 134–144)
Total Protein: 6.7 g/dL (ref 6.0–8.5)

## 2018-12-08 LAB — LIPID PANEL W/O CHOL/HDL RATIO
Cholesterol, Total: 137 mg/dL (ref 100–199)
HDL: 58 mg/dL (ref >39)
LDL Calculated: 65 mg/dL (ref 0–99)
Triglycerides: 69 mg/dL (ref 0–149)
VLDL Cholesterol Cal: 14 mg/dL (ref 5–40)

## 2018-12-08 NOTE — Assessment & Plan Note (Signed)
Recheck lipids, adjust as needed. Continue good lifestyle modifications

## 2018-12-08 NOTE — Assessment & Plan Note (Signed)
Stable and WNL, continue current regimen 

## 2019-01-29 DIAGNOSIS — M25571 Pain in right ankle and joints of right foot: Secondary | ICD-10-CM | POA: Diagnosis not present

## 2019-01-29 DIAGNOSIS — M25561 Pain in right knee: Secondary | ICD-10-CM | POA: Diagnosis not present

## 2019-02-05 ENCOUNTER — Encounter: Payer: Self-pay | Admitting: Emergency Medicine

## 2019-02-05 ENCOUNTER — Emergency Department
Admission: EM | Admit: 2019-02-05 | Discharge: 2019-02-05 | Disposition: A | Discharge status: Home / Self Care | Payer: PPO | Attending: Emergency Medicine | Admitting: Emergency Medicine

## 2019-02-05 ENCOUNTER — Other Ambulatory Visit: Payer: Self-pay

## 2019-02-05 ENCOUNTER — Emergency Department: Payer: PPO

## 2019-02-05 DIAGNOSIS — S82114A Nondisplaced fracture of right tibial spine, initial encounter for closed fracture: Secondary | ICD-10-CM

## 2019-02-05 DIAGNOSIS — X501XXA Overexertion from prolonged static or awkward postures, initial encounter: Secondary | ICD-10-CM | POA: Insufficient documentation

## 2019-02-05 DIAGNOSIS — Z79899 Other long term (current) drug therapy: Secondary | ICD-10-CM | POA: Diagnosis not present

## 2019-02-05 DIAGNOSIS — Y9301 Activity, walking, marching and hiking: Secondary | ICD-10-CM | POA: Diagnosis not present

## 2019-02-05 DIAGNOSIS — M25561 Pain in right knee: Secondary | ICD-10-CM | POA: Diagnosis not present

## 2019-02-05 DIAGNOSIS — I10 Essential (primary) hypertension: Secondary | ICD-10-CM | POA: Diagnosis not present

## 2019-02-05 DIAGNOSIS — Y929 Unspecified place or not applicable: Secondary | ICD-10-CM | POA: Diagnosis not present

## 2019-02-05 DIAGNOSIS — R52 Pain, unspecified: Secondary | ICD-10-CM | POA: Diagnosis not present

## 2019-02-05 DIAGNOSIS — Y999 Unspecified external cause status: Secondary | ICD-10-CM | POA: Diagnosis not present

## 2019-02-05 DIAGNOSIS — Z7982 Long term (current) use of aspirin: Secondary | ICD-10-CM | POA: Insufficient documentation

## 2019-02-05 DIAGNOSIS — S8991XA Unspecified injury of right lower leg, initial encounter: Secondary | ICD-10-CM | POA: Diagnosis not present

## 2019-02-05 MED ORDER — HYDROCODONE-ACETAMINOPHEN 5-325 MG PO TABS: 1.0000 | ORAL_TABLET | ORAL | 0 refills | Status: DC | PRN

## 2019-02-05 MED ORDER — MELOXICAM 7.5 MG PO TABS: 7.5000 mg | ORAL_TABLET | Freq: Every day | ORAL | 0 refills | Status: AC

## 2019-02-05 MED ORDER — HYDROCODONE-ACETAMINOPHEN 5-325 MG PO TABS
1.0000 | ORAL_TABLET | Freq: Once | ORAL | Status: AC
  Administered 2019-02-05: 20:00:00 1 via ORAL
  Filled 2019-02-05: qty 1

## 2019-02-05 NOTE — ED Provider Notes (Signed)
[facility] Emergency Department Provider Note  ____________________________________________  Time seen: Approximately 5:08 PM  I have reviewed the triage vital signs and the nursing notes.   HISTORY  Chief Complaint Knee Pain     HPI Heather James is a 68 y.o. female who presents the emergency department via EMS for complaint of right knee pain.  Patient reports that she twisted her knee 2 weeks ago, was seen and diagnosed with inflammation of either ligament or muscle.  Patient reports that she has been on prednisone, seems to be improving until today she had a sharp pain and her knee gave out.  Patient reports that initially after this injury she was unable to bear weight on her knee.  She denies any gross edema or ecchymosis of the knee.  She did not fall and hit her head or lose consciousness.  Patient is complaining of musculoskeletal pain to the right knee only.  No radicular symptoms.  No low back pain.         Past Medical History:  Diagnosis Date  . Cancer ([place])    skin ca on leg  . CSF leak from ear   . Hypertension   . Obesity   . Osteopenia     Patient Active Problem List   Diagnosis Date Noted  . BMI 40.0-44.9, adult ([place]) 06/05/2018  . Hypercholesteremia 06/05/2018  . Advanced care planning/counseling discussion 05/30/2017  . Sinus pressure 05/30/2017  . Advance directive discussed with patient 05/24/2016  . Routine general medical examination at a health care facility 05/24/2016  . Tendonitis of foot 05/24/2016  . Hypertension 05/23/2015  . CSF leak from ear 05/13/2015  . Osteopenia 05/13/2015  . Morbid obesity ([place]) 05/13/2015    Past Surgical History:  Procedure Laterality Date  . BREAST BIOPSY Left 11/21/96   neg  . BREAST SURGERY Left    cyst removed  . COLONOSCOPY WITH PROPOFOL N/A 03/14/2018   Procedure: COLONOSCOPY WITH PROPOFOL;  Surgeon: Toledo, Benay Pike, MD;  Location: [facility] ENDOSCOPY;  Service: Gastroenterology;   Laterality: N/A;  . FACIAL NERVE SURGERY    . PILONIDAL CYST EXCISION    . SKIN CANCER EXCISION Right    right leg, April 2017    Prior to Admission medications   Medication Sig Start Date End Date Taking? Authorizing Provider  albuterol (PROVENTIL HFA;VENTOLIN HFA) 108 (90 Base) MCG/ACT inhaler Inhale 2 puffs into the lungs every 6 (six) hours as needed for wheezing or shortness of breath. 06/16/17   Kathrine Haddock, NP  aspirin 81 MG tablet Take 81 mg by mouth daily.    [provider]  atorvastatin (LIPITOR) 20 MG tablet Take 1 tablet (20 mg total) by mouth daily. 12/07/18   [name], PA-C  cyclobenzaprine (FLEXERIL) 5 MG tablet Take 1 tablet (5 mg total) by mouth 3 (three) times daily as needed for muscle spasms. 01/23/18   [name], PA-C  diclofenac sodium (VOLTAREN) 1 % GEL Apply 4 g topically 4 (four) times daily. 09/05/18   [name], PA-C  hydrochlorothiazide (HYDRODIURIL) 50 MG tablet Take 1 tablet (50 mg total) by mouth every other day. 12/07/18   [name], PA-C  HYDROcodone-acetaminophen (NORCO/VICODIN) 5-325 MG tablet Take 1 tablet by mouth every 4 (four) hours as needed for moderate pain. 02/05/19   Krisha Beegle, Charline Bills, PA-C  meloxicam (MOBIC) 7.5 MG tablet Take 1 tablet (7.5 mg total) by mouth daily. 02/05/19 02/05/20  Kemani Heidel, Charline Bills, PA-C  mometasone (  NASONEX) 50 MCG/ACT nasal spray Place 2 sprays into the nose daily. 05/30/17   Kathrine Haddock, NP  NEOMYCIN-POLYMYXIN-HYDROCORTISONE (CORTISPORIN) 1 % SOLN OTIC solution Apply 1-2 drops to toe BID after soaking 05/24/18   Regal, Tamala Fothergill, DPM  potassium chloride SA (K-DUR,KLOR-CON) 20 MEQ tablet TK 1 T PO QD WF 11/25/17   [provider]  raloxifene (EVISTA) 60 MG tablet Take 1 tablet (60 mg total) by mouth daily. 06/01/18   [name], PA-C    Allergies Patient has no known allergies.  Family History  Problem Relation Age of Onset  . Heart  disease Mother   . Diabetes Mother   . Hypertension Mother   . Breast cancer Mother 30  . Heart disease Father   . Diabetes Father   . Hypertension Brother   . Cancer Maternal Grandmother        spine  . Heart disease Maternal Grandfather   . Heart disease Paternal Grandmother   . Emphysema Paternal Grandfather     Social History Social History   Tobacco Use  . Smoking status: Never Smoker  . Smokeless tobacco: Never Used  Substance Use Topics  . Alcohol use: No    Alcohol/week: 0.0 standard drinks  . Drug use: No     Review of Systems  Constitutional: No fever/chills Eyes: No visual changes. No discharge ENT: No upper respiratory complaints. Cardiovascular: no chest pain. Respiratory: no cough. No SOB. Gastrointestinal: No abdominal pain.  No nausea, no vomiting.   Musculoskeletal: Positive for R knee pain/injury Skin: Negative for rash, abrasions, lacerations, ecchymosis. Neurological: Negative for headaches, focal weakness or numbness. 10-point ROS otherwise negative.  ____________________________________________   PHYSICAL EXAM:  VITAL SIGNS: ED Triage Vitals  Enc Vitals Group     BP 02/05/19 1659 (!) 170/81     Pulse Rate 02/05/19 1659 82     Resp 02/05/19 1659 20     Temp 02/05/19 1659 99.2 F (37.3 C)     Temp Source 02/05/19 1659 Oral     SpO2 02/05/19 1659 99 %     Weight 02/05/19 1701 232 lb (105.2 kg)     Height 02/05/19 1701 5\' 2"  (1.575 m)     Head Circumference --      Peak Flow --      Pain Score --      Pain Loc --      Pain Edu? --      Excl. in [place]? --      Constitutional: Alert and oriented. Well appearing and in no acute distress. Eyes: Conjunctivae are normal. PERRL. EOMI. Head: Atraumatic. Neck: No stridor.    Cardiovascular: Normal rate, regular rhythm. Normal S1 and S2.  Good peripheral circulation. Respiratory: Normal respiratory effort without tachypnea or retractions. Lungs CTAB. Good air entry to the bases with no  decreased or absent breath sounds. Musculoskeletal: Full range of motion to all extremities. No gross deformities appreciated.  Visualization of the right knee reveals no erythema, edema, ecchymosis, deformity.  Patient is able to extend and flex the knee at this time.  Mild tenderness to palpation over the distal aspect of the hamstring with no palpable abnormality or deficit.  No other appreciable tenderness to palpation over the osseous or muscular structures of the knee.  No ballottement.  Negative Lockman's, varus and valgus, McMurray's.  Dorsalis pedis pulse and sensation intact distally. Neurologic:  Normal speech and language. No gross focal neurologic deficits are appreciated.  Skin:  Skin is warm,  dry and intact. No rash noted. Psychiatric: Mood and affect are normal. Speech and behavior are normal. Patient exhibits appropriate insight and judgement.   ____________________________________________   LABS (all labs ordered are listed, but only abnormal results are displayed)  Labs Reviewed - No data to display ____________________________________________  EKG   ____________________________________________  RADIOLOGY I personally viewed and evaluated these images as part of my medical decision making, as well as reviewing the written report by the radiologist.  I concur with fracture of the lateral tibial spine.  Dg Knee Complete 4 Views Right  Result Date: 02/05/2019 CLINICAL DATA:  Pain. EXAM: RIGHT KNEE - COMPLETE 4+ VIEW COMPARISON:  None. FINDINGS: There is a possible age-indeterminate fracture of the lateral tibial spine. There is no significant joint effusion. Minimal multicompartmental degenerative changes are noted. IMPRESSION: 1. Probable age-indeterminate fracture of the lateral tibial spine. Given the lack of a significant joint effusion, this is favored to be chronic. 2. No other acute osseous abnormality. 3. Mild multicompartmental degenerative changes. Electronically  Signed   By: Constance Holster M.D.   On: 02/05/2019 19:29    ____________________________________________    PROCEDURES  Procedure(s) performed:    Procedures    Medications  HYDROcodone-acetaminophen (NORCO/VICODIN) 5-325 MG per tablet 1 tablet (1 tablet Oral Given 02/05/19 1942)     ____________________________________________   INITIAL IMPRESSION / ASSESSMENT AND PLAN / ED COURSE  Pertinent labs & imaging results that were available during my care of the patient were reviewed by me and considered in my medical decision making (see chart for details).  Review of the [place] CSRS was performed in accordance of the [place] prior to dispensing any controlled drugs.           Patient's diagnosis is consistent with tibial spine fracture.  Patient presented to the emergency department with acute pain of the right knee.  Patient injured her knee 2 weeks ago, had been progressively improving on steroid use.  Patient was walking, stepped wrong felt a significant sharp pain to the right knee and was unable to bear weight on same.  Patient described the pain as the posterior aspect of the knee.  No evidence of acute ligamentous rupture on exam.  X-Farve reveals findings consistent with lateral tibial spine fracture.  I suspect that this is patient source of pain and that the original injury occurred 2 weeks ago, which reveals why there is no significant joint effusion at this time.  Patient is placed in knee immobilizer, crutches given for ambulation.. Patient will be discharged home with prescriptions for low-dose anti-inflammatory to be taken in addition to the prednisone course as well as pain medication be taken as needed.. Patient is to follow up with orthopedics as needed or otherwise directed. Patient is given ED precautions to return to the ED for any worsening or new symptoms.     ____________________________________________  FINAL CLINICAL IMPRESSION(S) / ED DIAGNOSES  Final  diagnoses:  Closed nondisplaced fracture of spine of right tibia, initial encounter      NEW MEDICATIONS STARTED DURING THIS VISIT:  ED Discharge Orders         Ordered    HYDROcodone-acetaminophen (NORCO/VICODIN) 5-325 MG tablet  Every 4 hours PRN     02/05/19 2012    meloxicam (MOBIC) 7.5 MG tablet  Daily     02/05/19 2012              This chart was dictated using voice recognition software/Dragon. Despite best efforts to proofread, errors  can occur which can change the meaning. Any change was purely unintentional.    Brynda Peon 02/05/19 2012    Nena Polio, MD 02/05/19 2239

## 2019-02-05 NOTE — ED Triage Notes (Signed)
presents via EMS with right knee pain  States twisted knee about 2 weeks ago  Then felt a "pop" while walking today  States she is not able to bear wt

## 2019-02-27 DIAGNOSIS — D225 Melanocytic nevi of trunk: Secondary | ICD-10-CM | POA: Diagnosis not present

## 2019-02-27 DIAGNOSIS — L538 Other specified erythematous conditions: Secondary | ICD-10-CM | POA: Diagnosis not present

## 2019-02-27 DIAGNOSIS — D2271 Melanocytic nevi of right lower limb, including hip: Secondary | ICD-10-CM | POA: Diagnosis not present

## 2019-02-27 DIAGNOSIS — D2262 Melanocytic nevi of left upper limb, including shoulder: Secondary | ICD-10-CM | POA: Diagnosis not present

## 2019-02-27 DIAGNOSIS — D2261 Melanocytic nevi of right upper limb, including shoulder: Secondary | ICD-10-CM | POA: Diagnosis not present

## 2019-02-27 DIAGNOSIS — D2272 Melanocytic nevi of left lower limb, including hip: Secondary | ICD-10-CM | POA: Diagnosis not present

## 2019-02-27 DIAGNOSIS — L820 Inflamed seborrheic keratosis: Secondary | ICD-10-CM | POA: Diagnosis not present

## 2019-02-28 DIAGNOSIS — S83511A Sprain of anterior cruciate ligament of right knee, initial encounter: Secondary | ICD-10-CM | POA: Diagnosis not present

## 2019-03-08 DIAGNOSIS — M25561 Pain in right knee: Secondary | ICD-10-CM | POA: Diagnosis not present

## 2019-03-28 DIAGNOSIS — M1711 Unilateral primary osteoarthritis, right knee: Secondary | ICD-10-CM | POA: Diagnosis not present

## 2019-03-28 DIAGNOSIS — S83241A Other tear of medial meniscus, current injury, right knee, initial encounter: Secondary | ICD-10-CM | POA: Diagnosis not present

## 2019-05-07 DIAGNOSIS — M1711 Unilateral primary osteoarthritis, right knee: Secondary | ICD-10-CM | POA: Diagnosis not present

## 2019-05-07 DIAGNOSIS — Z6841 Body Mass Index (BMI) 40.0 and over, adult: Secondary | ICD-10-CM | POA: Diagnosis not present

## 2019-05-28 ENCOUNTER — Other Ambulatory Visit: Payer: Self-pay | Admitting: Family Medicine

## 2019-05-28 DIAGNOSIS — Z1231 Encounter for screening mammogram for malignant neoplasm of breast: Secondary | ICD-10-CM

## 2019-06-04 ENCOUNTER — Ambulatory Visit: Payer: PPO

## 2019-06-04 ENCOUNTER — Encounter: Payer: PPO | Admitting: Family Medicine

## 2019-06-07 ENCOUNTER — Ambulatory Visit (INDEPENDENT_AMBULATORY_CARE_PROVIDER_SITE_OTHER): Payer: PPO

## 2019-06-07 ENCOUNTER — Ambulatory Visit (INDEPENDENT_AMBULATORY_CARE_PROVIDER_SITE_OTHER): Payer: PPO | Admitting: Family Medicine

## 2019-06-07 ENCOUNTER — Other Ambulatory Visit: Payer: Self-pay

## 2019-06-07 VITALS — BP 124/78 | HR 85 | Temp 98.0°F | Resp 15 | Ht 63.0 in | Wt 239.8 lb

## 2019-06-07 VITALS — BP 124/78 | HR 85 | Temp 98.0°F | Ht 63.0 in | Wt 239.8 lb

## 2019-06-07 DIAGNOSIS — E7800 Pure hypercholesterolemia, unspecified: Secondary | ICD-10-CM

## 2019-06-07 DIAGNOSIS — I10 Essential (primary) hypertension: Secondary | ICD-10-CM

## 2019-06-07 DIAGNOSIS — M8580 Other specified disorders of bone density and structure, unspecified site: Secondary | ICD-10-CM | POA: Diagnosis not present

## 2019-06-07 DIAGNOSIS — Z0000 Encounter for general adult medical examination without abnormal findings: Secondary | ICD-10-CM

## 2019-06-07 MED ORDER — HYDROCHLOROTHIAZIDE 50 MG PO TABS: 50.0000 mg | ORAL_TABLET | ORAL | 1 refills | Status: DC

## 2019-06-07 MED ORDER — ATORVASTATIN CALCIUM 20 MG PO TABS: 20.0000 mg | ORAL_TABLET | Freq: Every day | ORAL | 1 refills | Status: DC

## 2019-06-07 MED ORDER — RALOXIFENE HCL 60 MG PO TABS: 60.0000 mg | ORAL_TABLET | Freq: Every day | ORAL | 3 refills | Status: DC

## 2019-06-07 NOTE — Progress Notes (Signed)
BP 124/78   Pulse 85   Temp 98 F (36.7 C) (Temporal)   Ht 5\' 3"  (1.6 m)   Wt 239 lb 12.8 oz (108.8 kg)   LMP  (LMP Unknown)   BMI 42.48 kg/m    Subjective:    Patient ID: Heather James, female    DOB: Nov 17, 1950, 68 y.o.   MRN: [id number]  HPI: Heather James is a 68 y.o. female presenting on 06/07/2019 for comprehensive medical examination. Current medical complaints include:see below  No new concerns today, patient states she's doing well. Taking medicines faithfully without side effects. Denies CP, SOB, HAs, dizziness, claudication, myalgias.   HTN - Home BPs 120s/70s.  HLD - tolerating lipitor well, denies side effects. Not following particular diet or exercising regularly.   Osteopenia - on evista and good OTC supplements. No recent issues.   She currently lives with: Menopausal Symptoms: no  Depression Screen done today and results listed below:  Depression screen [facility] 2/9 06/07/2019 06/01/2018 01/23/2018 05/27/2017 05/24/2016  Decreased Interest 0 0 0 0 0  Down, Depressed, Hopeless 0 0 0 0 0  PHQ - 2 Score 0 0 0 0 0  Altered sleeping - - - - 0  Tired, decreased energy - - - - 0  Change in appetite - - - - 0  Feeling bad or failure about yourself  - - - - 0  Trouble concentrating - - - - 0  Moving slowly or fidgety/restless - - - - 0  Suicidal thoughts - - - - 0  PHQ-9 Score - - - - 0    The patient does not have a history of falls. I did complete a risk assessment for falls. A plan of care for falls was documented.   Past Medical History:  Past Medical History:  Diagnosis Date  . Cancer ([place])    skin ca on leg  . CSF leak from ear   . Hypercholesteremia   . Hypertension   . Obesity   . Osteopenia     Surgical History:  Past Surgical History:  Procedure Laterality Date  . BREAST BIOPSY Left 11/21/96   neg  . BREAST SURGERY Left    cyst removed  . COLONOSCOPY WITH PROPOFOL N/A 03/14/2018   Procedure: COLONOSCOPY WITH PROPOFOL;  Surgeon:  Toledo, Benay Pike, MD;  Location: [facility] ENDOSCOPY;  Service: Gastroenterology;  Laterality: N/A;  . FACIAL NERVE SURGERY    . PILONIDAL CYST EXCISION    . SKIN CANCER EXCISION Right    right leg, April 2017    Medications:  Current Outpatient Medications on File Prior to Visit  Medication Sig  . albuterol (PROVENTIL HFA;VENTOLIN HFA) 108 (90 Base) MCG/ACT inhaler Inhale 2 puffs into the lungs every 6 (six) hours as needed for wheezing or shortness of breath.  Marland Kitchen aspirin 81 MG tablet Take 81 mg by mouth daily.  . diclofenac sodium (VOLTAREN) 1 % GEL Apply 4 g topically 4 (four) times daily.  Marland Kitchen HYDROcodone-acetaminophen (NORCO/VICODIN) 5-325 MG tablet Take 1 tablet by mouth every 4 (four) hours as needed for moderate pain.  . meloxicam (MOBIC) 7.5 MG tablet Take 1 tablet (7.5 mg total) by mouth daily.  . mometasone (NASONEX) 50 MCG/ACT nasal spray Place 2 sprays into the nose daily.  . potassium chloride SA (K-DUR,KLOR-CON) 20 MEQ tablet TK 1 T PO QD WF   No current facility-administered medications on file prior to visit.    Allergies:  No Known Allergies  Social History:  Social History   Socioeconomic History  . Marital status: Divorced    Spouse name: Not on file  . Number of children: Not on file  . Years of education: 24  . Highest education level: 12th grade  Occupational History  . Occupation: retired  Tobacco Use  . Smoking status: Never Smoker  . Smokeless tobacco: Never Used  Substance and Sexual Activity  . Alcohol use: No    Alcohol/week: 0.0 standard drinks  . Drug use: No  . Sexual activity: Not Currently    Birth control/protection: None  Other Topics Concern  . Not on file  Social History Narrative  . Not on file   Social Determinants of Health   Financial Resource Strain:   . Difficulty of Paying Living Expenses: Not on file  Food Insecurity:   . Worried About Charity fundraiser in the Last Year: Not on file  . Ran Out of Food in the Last Year:  Not on file  Transportation Needs:   . Lack of Transportation (Medical): Not on file  . Lack of Transportation (Non-Medical): Not on file  Physical Activity:   . Days of Exercise per Week: Not on file  . Minutes of Exercise per Session: Not on file  Stress:   . Feeling of Stress : Not on file  Social Connections:   . Frequency of Communication with Friends and Family: Not on file  . Frequency of Social Gatherings with Friends and Family: Not on file  . Attends Religious Services: Not on file  . Active Member of Clubs or Organizations: Not on file  . Attends Archivist Meetings: Not on file  . Marital Status: Not on file  Intimate Partner Violence:   . Fear of Current or Ex-Partner: Not on file  . Emotionally Abused: Not on file  . Physically Abused: Not on file  . Sexually Abused: Not on file   Social History   Tobacco Use  Smoking Status Never Smoker  Smokeless Tobacco Never Used   Social History   Substance and Sexual Activity  Alcohol Use No  . Alcohol/week: 0.0 standard drinks    Family History:  Family History  Problem Relation Age of Onset  . Heart disease Mother   . Diabetes Mother   . Hypertension Mother   . Breast cancer Mother 42  . Cancer Mother   . Heart disease Father   . Diabetes Father   . Hypertension Brother   . Cancer Maternal Grandmother        spine  . Heart disease Maternal Grandfather   . Hearing loss Maternal Grandfather   . Heart disease Paternal Grandmother   . Emphysema Paternal Grandfather     Past medical history, surgical history, medications, allergies, family history and social history reviewed with patient today and changes made to appropriate areas of the chart.   Review of Systems - General ROS: negative Psychological ROS: negative Ophthalmic ROS: negative ENT ROS: negative Allergy and Immunology ROS: negative Hematological and Lymphatic ROS: negative Endocrine ROS: negative Breast ROS: negative for breast  lumps Respiratory ROS: no cough, shortness of breath, or wheezing Cardiovascular ROS: no chest pain or dyspnea on exertion Gastrointestinal ROS: no abdominal pain, change in bowel habits, or black or bloody stools Genito-Urinary ROS: no dysuria, trouble voiding, or hematuria Musculoskeletal ROS: negative Neurological ROS: no TIA or stroke symptoms Dermatological ROS: negative All other ROS negative except what is listed above and in the HPI.  Objective:    BP 124/78   Pulse 85   Temp 98 F (36.7 C) (Temporal)   Ht 5\' 3"  (1.6 m)   Wt 239 lb 12.8 oz (108.8 kg)   LMP  (LMP Unknown)   BMI 42.48 kg/m   Wt Readings from Last 3 Encounters:  06/07/19 239 lb 12.8 oz (108.8 kg)  06/07/19 239 lb 12.8 oz (108.8 kg)  02/05/19 232 lb (105.2 kg)    Physical Exam Vitals and nursing note reviewed.  Constitutional:      General: She is not in acute distress.    Appearance: She is well-developed.  HENT:     Head: Atraumatic.     Right Ear: External ear normal.     Left Ear: External ear normal.     Nose: Nose normal.     Mouth/Throat:     Pharynx: No oropharyngeal exudate.  Eyes:     General: No scleral icterus.    Conjunctiva/sclera: Conjunctivae normal.     Pupils: Pupils are equal, round, and reactive to light.  Neck:     Thyroid: No thyromegaly.  Cardiovascular:     Rate and Rhythm: Normal rate and regular rhythm.     Heart sounds: Normal heart sounds.  Pulmonary:     Effort: Pulmonary effort is normal. No respiratory distress.     Breath sounds: Normal breath sounds.  Chest:     Breasts:        Right: No mass, skin change or tenderness.        Left: No mass, skin change or tenderness.  Abdominal:     General: Bowel sounds are normal.     Palpations: Abdomen is soft. There is no mass.     Tenderness: There is no abdominal tenderness.  Genitourinary:    Comments: GU exam declined Musculoskeletal:        General: No tenderness. Normal range of motion.     Cervical  back: Normal range of motion and neck supple.  Lymphadenopathy:     Cervical: No cervical adenopathy.     Upper Body:     Right upper body: No axillary adenopathy.     Left upper body: No axillary adenopathy.  Skin:    General: Skin is warm and dry.     Findings: No rash.  Neurological:     Mental Status: She is alert and oriented to person, place, and time.     Cranial Nerves: No cranial nerve deficit.  Psychiatric:        Behavior: Behavior normal.    Results for orders placed or performed in visit on 06/07/19  Microscopic Examination   URINE  Result Value Ref Range   WBC, UA 0-5 0 - 5 /hpf   RBC None seen 0 - 2 /hpf   Epithelial Cells (non renal) 0-10 0 - 10 /hpf   Mucus, UA Present Not Estab.   Bacteria, UA None seen None seen/Few  Urine Culture, Reflex   URINE  Result Value Ref Range   Urine Culture, Routine Preliminary report (A)    Organism ID, Bacteria Comment    ORGANISM ID, BACTERIA Escherichia coli (A)    Antimicrobial Susceptibility Comment   CBC with Differential/Platelet out  Result Value Ref Range   WBC 5.8 3.4 - 10.8 x10E3/uL   RBC 5.07 3.77 - 5.28 x10E6/uL   Hemoglobin 15.6 11.1 - 15.9 g/dL   Hematocrit 45.8 34.0 - 46.6 %   MCV 90 79 - 97 fL   MCH  30.8 26.6 - 33.0 pg   MCHC 34.1 31.5 - 35.7 g/dL   RDW 12.5 11.7 - 15.4 %   Platelets 277 150 - 450 x10E3/uL   Neutrophils 66 Not Estab. %   Lymphs 25 Not Estab. %   Monocytes 8 Not Estab. %   Eos 1 Not Estab. %   Basos 0 Not Estab. %   Neutrophils Absolute 3.8 1.4 - 7.0 x10E3/uL   Lymphocytes Absolute 1.4 0.7 - 3.1 x10E3/uL   Monocytes Absolute 0.5 0.1 - 0.9 x10E3/uL   EOS (ABSOLUTE) 0.1 0.0 - 0.4 x10E3/uL   Basophils Absolute 0.0 0.0 - 0.2 x10E3/uL   Immature Granulocytes 0 Not Estab. %   Immature Grans (Abs) 0.0 0.0 - 0.1 x10E3/uL  Comprehensive metabolic panel  Result Value Ref Range   Glucose 94 65 - 99 mg/dL   BUN 15 8 - 27 mg/dL   Creatinine, Ser 0.88 0.57 - 1.00 mg/dL   GFR calc non Af  Amer 67 >59 mL/min/1.73   GFR calc Af Amer 78 >59 mL/min/1.73   BUN/Creatinine Ratio 17 12 - 28   Sodium 144 134 - 144 mmol/L   Potassium 4.0 3.5 - 5.2 mmol/L   Chloride 103 96 - 106 mmol/L   CO2 28 20 - 29 mmol/L   Calcium 9.9 8.7 - 10.3 mg/dL   Total Protein 6.7 6.0 - 8.5 g/dL   Albumin 4.6 3.8 - 4.8 g/dL   Globulin, Total 2.1 1.5 - 4.5 g/dL   Albumin/Globulin Ratio 2.2 1.2 - 2.2   Bilirubin Total 1.1 0.0 - 1.2 mg/dL   Alkaline Phosphatase 116 39 - 117 IU/L   AST 28 0 - 40 IU/L   ALT 30 0 - 32 IU/L  Lipid Panel w/o Chol/HDL Ratio out  Result Value Ref Range   Cholesterol, Total 137 100 - 199 mg/dL   Triglycerides 80 0 - 149 mg/dL   HDL 59 >39 mg/dL   VLDL Cholesterol Cal 16 5 - 40 mg/dL   LDL Chol Calc (NIH) 62 0 - 99 mg/dL  UA/M w/rflx Culture, Routine   Specimen: Urine   URINE  Result Value Ref Range   Specific Gravity, UA 1.020 1.005 - 1.030   pH, UA 6.5 5.0 - 7.5   Color, UA Yellow Yellow   Appearance Ur Clear Clear   Leukocytes,UA 1+ (A) Negative   Protein,UA Negative Negative/Trace   Glucose, UA Negative Negative   Ketones, UA Negative Negative   RBC, UA Negative Negative   Bilirubin, UA Negative Negative   Urobilinogen, Ur 0.2 0.2 - 1.0 mg/dL   Nitrite, UA Negative Negative   Microscopic Examination See below:    Urinalysis Reflex Comment       Assessment & Plan:   Problem List Items Addressed This Visit      Cardiovascular and Mediastinum   Hypertension - Primary    BPs stable and WNL, continue current regimen      Relevant Medications   hydrochlorothiazide (HYDRODIURIL) 50 MG tablet   atorvastatin (LIPITOR) 20 MG tablet   Other Relevant Orders   CBC with Differential/Platelet out (Completed)   Comprehensive metabolic panel (Completed)   UA/M w/rflx Culture, Routine (Completed)     Musculoskeletal and Integument   Osteopenia    Continue evista, calcium and vit D supplements and weight bearing exercises        Other   Morbid obesity (HCC)     Lifestyle modifications reviewed.       Hypercholesteremia  Recheck lipids, adjust as needed. Continue current regimen      Relevant Medications   hydrochlorothiazide (HYDRODIURIL) 50 MG tablet   atorvastatin (LIPITOR) 20 MG tablet   Other Relevant Orders   Lipid Panel w/o Chol/HDL Ratio out (Completed)    Other Visit Diagnoses    Annual physical exam           Follow up plan: Return in about 6 months (around 12/06/2019) for 6 month f/u.   LABORATORY TESTING:  - Pap smear: not applicable  IMMUNIZATIONS:   - Tdap: Tetanus vaccination status reviewed: last tetanus booster within 10 years. - Influenza: Up to date - Pneumovax: Up to date - Prevnar: Up to date - HPV: Not applicable - Zostavax vaccine: Up to date  SCREENING: -Mammogram: scheduled  - Colonoscopy: Up to date  - Bone Density: Up to date   PATIENT COUNSELING:   Advised to take 1 mg of folate supplement per day if capable of pregnancy.   Sexuality: Discussed sexually transmitted diseases, partner selection, use of condoms, avoidance of unintended pregnancy  and contraceptive alternatives.   Advised to avoid cigarette smoking.  I discussed with the patient that most people either abstain from alcohol or drink within safe limits (<=14/week and <=4 drinks/occasion for males, <=7/weeks and <= 3 drinks/occasion for females) and that the risk for alcohol disorders and other health effects rises proportionally with the number of drinks per week and how often a drinker exceeds daily limits.  Discussed cessation/primary prevention of drug use and availability of treatment for abuse.   Diet: Encouraged to adjust caloric intake to maintain  or achieve ideal body weight, to reduce intake of dietary saturated fat and total fat, to limit sodium intake by avoiding high sodium foods and not adding table salt, and to maintain adequate dietary potassium and calcium preferably from fresh fruits, vegetables, and low-fat dairy  products.    stressed the importance of regular exercise  Injury prevention: Discussed safety belts, safety helmets, smoke detector, smoking near bedding or upholstery.   Dental health: Discussed importance of regular tooth brushing, flossing, and dental visits.    NEXT PREVENTATIVE PHYSICAL DUE IN 1 YEAR. Return in about 6 months (around 12/06/2019) for 6 month f/u.

## 2019-06-07 NOTE — Patient Instructions (Signed)
Heather James , Thank you for taking time to come for your Medicare Wellness Visit. I appreciate your ongoing commitment to your health goals. Please review the following plan we discussed and let me know if I can assist you in the future.   Screening recommendations/referrals: Colonoscopy: completed 03/14/2018 Mammogram: completed 07/16/2018, scheduled 07/09/2019 Bone Density: completed 07/04/2017 Recommended yearly ophthalmology/optometry visit for glaucoma screening and checkup Recommended yearly dental visit for hygiene and checkup  Vaccinations: Influenza vaccine: up to date  Pneumococcal vaccine: up to date Tdap vaccine: up to date Shingles vaccine: up to date     Advanced directives: Please bring a copy of your health care power of attorney and living will to the office at your convenience.  Conditions/risks identified: none   Next appointment: Follow up in one year for your annual wellness visit    Preventive Care 65 Years and Older, Female Preventive care refers to lifestyle choices and visits with your health care provider that can promote health and wellness. What does preventive care include?  A yearly physical exam. This is also called an annual well check.  Dental exams once or twice a year.  Routine eye exams. Ask your health care provider how often you should have your eyes checked.  Personal lifestyle choices, including:  Daily care of your teeth and gums.  Regular physical activity.  Eating a healthy diet.  Avoiding tobacco and drug use.  Limiting alcohol use.  Practicing safe sex.  Taking low-dose aspirin every day.  Taking vitamin and mineral supplements as recommended by your health care provider. What happens during an annual well check? The services and screenings done by your health care provider during your annual well check will depend on your age, overall health, lifestyle risk factors, and family history of disease. Counseling  Your health  care provider may ask you questions about your:  Alcohol use.  Tobacco use.  Drug use.  Emotional well-being.  Home and relationship well-being.  Sexual activity.  Eating habits.  History of falls.  Memory and ability to understand (cognition).  Work and work Statistician.  Reproductive health. Screening  You may have the following tests or measurements:  Height, weight, and BMI.  Blood pressure.  Lipid and cholesterol levels. These may be checked every 5 years, or more frequently if you are over 62 years old.  Skin check.  Lung cancer screening. You may have this screening every year starting at age 77 if you have a 30-pack-year history of smoking and currently smoke or have quit within the past 15 years.  Fecal occult blood test (FOBT) of the stool. You may have this test every year starting at age 34.  Flexible sigmoidoscopy or colonoscopy. You may have a sigmoidoscopy every 5 years or a colonoscopy every 10 years starting at age 67.  Hepatitis C blood test.  Hepatitis B blood test.  Sexually transmitted disease (STD) testing.  Diabetes screening. This is done by checking your blood sugar (glucose) after you have not eaten for a while (fasting). You may have this done every 1-3 years.  Bone density scan. This is done to screen for osteoporosis. You may have this done starting at age 29.  Mammogram. This may be done every 1-2 years. Talk to your health care provider about how often you should have regular mammograms. Talk with your health care provider about your test results, treatment options, and if necessary, the need for more tests. Vaccines  Your health care provider may recommend certain  vaccines, such as:  Influenza vaccine. This is recommended every year.  Tetanus, diphtheria, and acellular pertussis (Tdap, Td) vaccine. You may need a Td booster every 10 years.  Zoster vaccine. You may need this after age 3.  Pneumococcal 13-valent conjugate  (PCV13) vaccine. One dose is recommended after age 76.  Pneumococcal polysaccharide (PPSV23) vaccine. One dose is recommended after age 48. Talk to your health care provider about which screenings and vaccines you need and how often you need them. This information is not intended to replace advice given to you by your health care provider. Make sure you discuss any questions you have with your health care provider. Document Released: 07/11/2015 Document Revised: 03/03/2016 Document Reviewed: 04/15/2015 Elsevier Interactive Patient Education  2017 [place] Prevention in the Home Falls can cause injuries. They can happen to people of all ages. There are many things you can do to make your home safe and to help prevent falls. What can I do on the outside of my home?  Regularly fix the edges of walkways and driveways and fix any cracks.  Remove anything that might make you trip as you walk through a door, such as a raised step or threshold.  Trim any bushes or trees on the path to your home.  Use bright outdoor lighting.  Clear any walking paths of anything that might make someone trip, such as rocks or tools.  Regularly check to see if handrails are loose or broken. Make sure that both sides of any steps have handrails.  Any raised decks and porches should have guardrails on the edges.  Have any leaves, snow, or ice cleared regularly.  Use sand or salt on walking paths during winter.  Clean up any spills in your garage right away. This includes oil or grease spills. What can I do in the bathroom?  Use night lights.  Install grab bars by the toilet and in the tub and shower. Do not use towel bars as grab bars.  Use non-skid mats or decals in the tub or shower.  If you need to sit down in the shower, use a plastic, non-slip stool.  Keep the floor dry. Clean up any water that spills on the floor as soon as it happens.  Remove soap buildup in the tub or shower  regularly.  Attach bath mats securely with double-sided non-slip rug tape.  Do not have throw rugs and other things on the floor that can make you trip. What can I do in the bedroom?  Use night lights.  Make sure that you have a light by your bed that is easy to reach.  Do not use any sheets or blankets that are too big for your bed. They should not hang down onto the floor.  Have a firm chair that has side arms. You can use this for support while you get dressed.  Do not have throw rugs and other things on the floor that can make you trip. What can I do in the kitchen?  Clean up any spills right away.  Avoid walking on wet floors.  Keep items that you use a lot in easy-to-reach places.  If you need to reach something above you, use a strong step stool that has a grab bar.  Keep electrical cords out of the way.  Do not use floor polish or wax that makes floors slippery. If you must use wax, use non-skid floor wax.  Do not have throw rugs and other things  on the floor that can make you trip. What can I do with my stairs?  Do not leave any items on the stairs.  Make sure that there are handrails on both sides of the stairs and use them. Fix handrails that are broken or loose. Make sure that handrails are as long as the stairways.  Check any carpeting to make sure that it is firmly attached to the stairs. Fix any carpet that is loose or worn.  Avoid having throw rugs at the top or bottom of the stairs. If you do have throw rugs, attach them to the floor with carpet tape.  Make sure that you have a light switch at the top of the stairs and the bottom of the stairs. If you do not have them, ask someone to add them for you. What else can I do to help prevent falls?  Wear shoes that:  Do not have high heels.  Have rubber bottoms.  Are comfortable and fit you well.  Are closed at the toe. Do not wear sandals.  If you use a stepladder:  Make sure that it is fully  opened. Do not climb a closed stepladder.  Make sure that both sides of the stepladder are locked into place.  Ask someone to hold it for you, if possible.  Clearly mark and make sure that you can see:  Any grab bars or handrails.  First and last steps.  Where the edge of each step is.  Use tools that help you move around (mobility aids) if they are needed. These include:  Canes.  Walkers.  Scooters.  Crutches.  Turn on the lights when you go into a dark area. Replace any light bulbs as soon as they burn out.  Set up your furniture so you have a clear path. Avoid moving your furniture around.  If any of your floors are uneven, fix them.  If there are any pets around you, be aware of where they are.  Review your medicines with your doctor. Some medicines can make you feel dizzy. This can increase your chance of falling. Ask your doctor what other things that you can do to help prevent falls. This information is not intended to replace advice given to you by your health care provider. Make sure you discuss any questions you have with your health care provider. Document Released: 04/10/2009 Document Revised: 11/20/2015 Document Reviewed: 07/19/2014 Elsevier Interactive Patient Education  2017 [employer].

## 2019-06-07 NOTE — Progress Notes (Signed)
Subjective:   Heather James is a 68 y.o. female who presents for Medicare Annual (Subsequent) preventive examination.  Review of Systems:   Cardiac Risk Factors include: advanced age (>72men, >26 women);dyslipidemia;hypertension;obesity (BMI >30kg/m2)     Objective:     Vitals: BP 124/78 (BP Location: Right Arm, Patient Position: Sitting, Cuff Size: Normal)   Pulse 85   Temp 98 F (36.7 C) (Temporal)   Resp 15   Ht 5\' 3"  (1.6 m)   Wt 239 lb 12.8 oz (108.8 kg)   LMP  (LMP Unknown)   SpO2 96%   BMI 42.48 kg/m   Body mass index is 42.48 kg/m.  Advanced Directives 02/05/2019 06/01/2018 03/14/2018 05/27/2017 05/24/2016 05/23/2015 05/23/2015  Does Patient Have a Medical Advance Directive? No No Yes Yes Yes No No  Type of Advance Directive - Programmer, multimedia of [employer] Power of [place];Living will - -  Copy of [place] in Chart? - - No - copy requested No - copy requested - - -  Would patient like information on creating a medical advance directive? No - Patient declined Yes (MAU/Ambulatory/Procedural Areas - Information given) - - - No - patient declined information -    Tobacco Social History   Tobacco Use  Smoking Status Never Smoker  Smokeless Tobacco Never Used     Counseling given: Not Answered   Clinical Intake:  Pre-visit preparation completed: Yes  Pain : No/denies pain     Nutritional Status: BMI > 30  Obese Nutritional Risks: None Diabetes: No  How often do you need to have someone help you when you read instructions, pamphlets, or other written materials from your doctor or pharmacy?: 1 - Never  Interpreter Needed?: No  Information entered by :: Joellyn Grandt,LPN  Past Medical History:  Diagnosis Date  . Cancer ([place])    skin ca on leg  . CSF leak from ear   . Hypercholesteremia   . Hypertension   . Obesity   . Osteopenia    Past Surgical History:  Procedure Laterality Date   . BREAST BIOPSY Left 11/21/96   neg  . BREAST SURGERY Left    cyst removed  . COLONOSCOPY WITH PROPOFOL N/A 03/14/2018   Procedure: COLONOSCOPY WITH PROPOFOL;  Surgeon: Toledo, Benay Pike, MD;  Location: [facility] ENDOSCOPY;  Service: Gastroenterology;  Laterality: N/A;  . FACIAL NERVE SURGERY    . PILONIDAL CYST EXCISION    . SKIN CANCER EXCISION Right    right leg, April 2017   Family History  Problem Relation Age of Onset  . Heart disease Mother   . Diabetes Mother   . Hypertension Mother   . Breast cancer Mother 57  . Cancer Mother   . Heart disease Father   . Diabetes Father   . Hypertension Brother   . Cancer Maternal Grandmother        spine  . Heart disease Maternal Grandfather   . Hearing loss Maternal Grandfather   . Heart disease Paternal Grandmother   . Emphysema Paternal Grandfather    Social History   Socioeconomic History  . Marital status: Divorced    Spouse name: Not on file  . Number of children: Not on file  . Years of education: 86  . Highest education level: 12th grade  Occupational History  . Occupation: retired  Tobacco Use  . Smoking status: Never Smoker  . Smokeless tobacco: Never Used  Substance and Sexual Activity  .  Alcohol use: No    Alcohol/week: 0.0 standard drinks  . Drug use: No  . Sexual activity: Not Currently    Birth control/protection: None  Other Topics Concern  . Not on file  Social History Narrative  . Not on file   Social Determinants of Health   Financial Resource Strain: Low Risk   . Difficulty of Paying Living Expenses: Not hard at all  Food Insecurity: No Food Insecurity  . Worried About Charity fundraiser in the Last Year: Never true  . Ran Out of Food in the Last Year: Never true  Transportation Needs: No Transportation Needs  . Lack of Transportation (Medical): No  . Lack of Transportation (Non-Medical): No  Physical Activity: Insufficiently Active  . Days of Exercise per Week: 2 days  . Minutes of Exercise per  Session: 40 min  Stress: No Stress Concern Present  . Feeling of Stress : Not at all  Social Connections: Somewhat Isolated  . Frequency of Communication with Friends and Family: More than three times a week  . Frequency of Social Gatherings with Friends and Family: Twice a week  . Attends Religious Services: More than 4 times per year  . Active Member of Clubs or Organizations: No  . Attends Archivist Meetings: Never  . Marital Status: Never married    Outpatient Encounter Medications as of 06/07/2019  Medication Sig  . albuterol (PROVENTIL HFA;VENTOLIN HFA) 108 (90 Base) MCG/ACT inhaler Inhale 2 puffs into the lungs every 6 (six) hours as needed for wheezing or shortness of breath.  Marland Kitchen aspirin 81 MG tablet Take 81 mg by mouth daily.  Marland Kitchen atorvastatin (LIPITOR) 20 MG tablet Take 1 tablet (20 mg total) by mouth daily.  . diclofenac sodium (VOLTAREN) 1 % GEL Apply 4 g topically 4 (four) times daily.  . hydrochlorothiazide (HYDRODIURIL) 50 MG tablet Take 1 tablet (50 mg total) by mouth every other day.  Marland Kitchen HYDROcodone-acetaminophen (NORCO/VICODIN) 5-325 MG tablet Take 1 tablet by mouth every 4 (four) hours as needed for moderate pain.  . meloxicam (MOBIC) 7.5 MG tablet Take 1 tablet (7.5 mg total) by mouth daily.  . mometasone (NASONEX) 50 MCG/ACT nasal spray Place 2 sprays into the nose daily.  . potassium chloride SA (K-DUR,KLOR-CON) 20 MEQ tablet TK 1 T PO QD WF  . raloxifene (EVISTA) 60 MG tablet Take 1 tablet (60 mg total) by mouth daily.  . [DISCONTINUED] cyclobenzaprine (FLEXERIL) 5 MG tablet Take 1 tablet (5 mg total) by mouth 3 (three) times daily as needed for muscle spasms.  . [DISCONTINUED] NEOMYCIN-POLYMYXIN-HYDROCORTISONE (CORTISPORIN) 1 % SOLN OTIC solution Apply 1-2 drops to toe BID after soaking   No facility-administered encounter medications on file as of 06/07/2019.    Activities of Daily Living In your present state of health, do you have any difficulty  performing the following activities: 06/07/2019  Hearing? Y  Comment problems with right ear since 1998. no hearing aids  Vision? N  Comment eyeglasses, goes to [facility]  Difficulty concentrating or making decisions? N  Walking or climbing stairs? Y  Comment takes time due to knee  Dressing or bathing? N  Doing errands, shopping? N  Preparing Food and eating ? N  Using the Toilet? N  In the past six months, have you accidently leaked urine? N  Do you have problems with loss of bowel control? N  Managing your Medications? N  Managing your Finances? N  Housekeeping or managing your Housekeeping? N  Some recent data might be hidden    Patient Care Team: [name], PA-C as PCP - General (Family Medicine) Elsie Saas, MD as Consulting Physician (Orthopedic Surgery)    Assessment:   This is a routine wellness examination for Emelly.  Exercise Activities and Dietary recommendations Current Exercise Habits: The patient does not participate in regular exercise at present, Exercise limited by: None identified  Goals    . DIET - INCREASE WATER INTAKE     Recommend drinking at least 5-6 glasses of water a day        Fall Risk: Fall Risk  06/07/2019 12/07/2018 06/01/2018 01/23/2018 05/27/2017  Falls in the past year? 0 0 0 No No  Number falls in past yr: 0 0 0 - -  Injury with Fall? 0 0 0 - -  Follow up - Falls evaluation completed - - -    FALL RISK PREVENTION PERTAINING TO THE HOME:  Any stairs in or around the home? Yes  going into home  If so, are there any without handrails? No   Home free of loose throw rugs in walkways, pet beds, electrical cords, etc? Yes  Adequate lighting in your home to reduce risk of falls? Yes   ASSISTIVE DEVICES UTILIZED TO PREVENT FALLS:  Life alert? No  Use of a cane, walker or w/c? No  Grab bars in the bathroom? No  Shower chair or bench in shower? No  Elevated toilet seat or a handicapped toilet? No   DME  ORDERS:  DME order needed?  No   TIMED UP AND GO:  Was the test performed? Yes .  Length of time to ambulate 10 feet: 9 sec.   GAIT:  Appearance of gait: Gait steady and fast without the use of an assistive device.  Education: Fall risk prevention has been discussed.  Intervention(s) required? No   DME/home health order needed?  No    Depression Screen PHQ 2/9 Scores 06/07/2019 06/01/2018 01/23/2018 05/27/2017  PHQ - 2 Score 0 0 0 0  PHQ- 9 Score - - - -     Cognitive Function     6CIT Screen 06/07/2019 06/01/2018 05/27/2017  What Year? 0 points 0 points 0 points  What month? 0 points 0 points 0 points  What time? 0 points 0 points 0 points  Count back from 20 0 points 0 points 0 points  Months in reverse 0 points 0 points 0 points  Repeat phrase 0 points 0 points 0 points  Total Score 0 0 0    Immunization History  Administered Date(s) Administered  . Influenza, High Dose Seasonal PF 04/12/2019  . Influenza-Unspecified 03/29/2016, 04/06/2017, 03/28/2018  . Pneumococcal Conjugate-13 05/23/2015  . Pneumococcal Polysaccharide-23 05/24/2016  . Tdap 04/03/2013  . Zoster 04/03/2013  . Zoster Recombinat (Shingrix) 02/24/2018, 05/17/2018, 08/11/2018    Qualifies for Shingles Vaccine? shingrix completed   Tdap: up to date   Flu Vaccine:   Pneumococcal Vaccine: up to date   Screening Tests Health Maintenance  Topic Date Due  . INFLUENZA VACCINE  01/27/2019  . MAMMOGRAM  07/06/2020  . COLONOSCOPY  03/15/2023  . TETANUS/TDAP  04/04/2023  . DEXA SCAN  Completed  . Hepatitis C Screening  Completed  . PNA vac Low Risk Adult  Completed    Cancer Screenings:  Colorectal Screening: Completed 03/14/2018. Repeat every 5 years  Mammogram: Completed 07/06/2018. Repeat every year; scheduled 07/09/2019  Bone Density: Completed 07/04/2017  Lung Cancer Screening: (Low Dose CT Chest recommended  if Age 34-80 years, 70 pack-year currently smoking OR have quit w/in 15years.) does  not qualify.     Additional Screening:  Hepatitis C Screening: does qualify; Completed 12/08/2015  Vision Screening: Recommended annual ophthalmology exams for early detection of glaucoma and other disorders of the eye. Is the patient up to date with their annual eye exam?  Yes  Who is the provider or what is the name of the office in which the pt attends annual eye exams? [facility]    Dental Screening: Recommended annual dental exams for proper oral hygiene  Community Resource Referral:  CRR required this visit?  No       Plan:  I have personally reviewed and addressed the Medicare Annual Wellness questionnaire and have noted the following in the patient's chart:  A. Medical and social history B. Use of alcohol, tobacco or illicit drugs  C. Current medications and supplements D. Functional ability and status E.  Nutritional status F.  Physical activity G. Advance directives H. List of other physicians I.  Hospitalizations, surgeries, and ER visits in previous 12 months J.  [place] such as hearing and vision if needed, cognitive and depression L. Referrals and appointments   In addition, I have reviewed and discussed with patient certain preventive protocols, quality metrics, and best practice recommendations. A written personalized care plan for preventive services as well as general preventive health recommendations were provided to patient.  Signed,    Bevelyn Ngo, LPN  [id number] Nurse Health Advisor   Nurse Notes: none

## 2019-06-08 LAB — COMPREHENSIVE METABOLIC PANEL
ALT: 30 IU/L (ref 0–32)
AST: 28 IU/L (ref 0–40)
Albumin/Globulin Ratio: 2.2 (ref 1.2–2.2)
Albumin: 4.6 g/dL (ref 3.8–4.8)
Alkaline Phosphatase: 116 IU/L (ref 39–117)
BUN/Creatinine Ratio: 17 (ref 12–28)
BUN: 15 mg/dL (ref 8–27)
Bilirubin Total: 1.1 mg/dL (ref 0.0–1.2)
CO2: 28 mmol/L (ref 20–29)
Calcium: 9.9 mg/dL (ref 8.7–10.3)
Chloride: 103 mmol/L (ref 96–106)
Creatinine, Ser: 0.88 mg/dL (ref 0.57–1.00)
GFR calc Af Amer: 78 mL/min/{1.73_m2} (ref >59)
GFR calc non Af Amer: 67 mL/min/{1.73_m2} (ref >59)
Globulin, Total: 2.1 g/dL (ref 1.5–4.5)
Glucose: 94 mg/dL (ref 65–99)
Potassium: 4 mmol/L (ref 3.5–5.2)
Sodium: 144 mmol/L (ref 134–144)
Total Protein: 6.7 g/dL (ref 6.0–8.5)

## 2019-06-08 LAB — CBC WITH DIFFERENTIAL/PLATELET
Basophils Absolute: 0 10*3/uL (ref 0.0–0.2)
Basos: 0 %
EOS (ABSOLUTE): 0.1 10*3/uL (ref 0.0–0.4)
Eos: 1 %
Hematocrit: 45.8 % (ref 34.0–46.6)
Hemoglobin: 15.6 g/dL (ref 11.1–15.9)
Immature Grans (Abs): 0 10*3/uL (ref 0.0–0.1)
Immature Granulocytes: 0 %
Lymphocytes Absolute: 1.4 10*3/uL (ref 0.7–3.1)
Lymphs: 25 %
MCH: 30.8 pg (ref 26.6–33.0)
MCHC: 34.1 g/dL (ref 31.5–35.7)
MCV: 90 fL (ref 79–97)
Monocytes Absolute: 0.5 10*3/uL (ref 0.1–0.9)
Monocytes: 8 %
Neutrophils Absolute: 3.8 10*3/uL (ref 1.4–7.0)
Neutrophils: 66 %
Platelets: 277 10*3/uL (ref 150–450)
RBC: 5.07 x10E6/uL (ref 3.77–5.28)
RDW: 12.5 % (ref 11.7–15.4)
WBC: 5.8 10*3/uL (ref 3.4–10.8)

## 2019-06-08 LAB — LIPID PANEL W/O CHOL/HDL RATIO
Cholesterol, Total: 137 mg/dL (ref 100–199)
HDL: 59 mg/dL (ref >39)
LDL Chol Calc (NIH): 62 mg/dL (ref 0–99)
Triglycerides: 80 mg/dL (ref 0–149)
VLDL Cholesterol Cal: 16 mg/dL (ref 5–40)

## 2019-06-10 NOTE — Assessment & Plan Note (Signed)
Lifestyle modifications reviewed.

## 2019-06-10 NOTE — Assessment & Plan Note (Signed)
Continue evista, calcium and vit D supplements and weight bearing exercises

## 2019-06-10 NOTE — Assessment & Plan Note (Signed)
Recheck lipids, adjust as needed. Continue current regimen 

## 2019-06-10 NOTE — Assessment & Plan Note (Signed)
BPs stable and WNL, continue current regimen 

## 2019-06-11 ENCOUNTER — Other Ambulatory Visit: Payer: Self-pay | Admitting: Family Medicine

## 2019-06-11 MED ORDER — NITROFURANTOIN MONOHYD MACRO 100 MG PO CAPS: 100.0000 mg | ORAL_CAPSULE | Freq: Two times a day (BID) | ORAL | 0 refills | Status: DC

## 2019-06-12 LAB — URINE CULTURE, REFLEX

## 2019-06-12 LAB — UA/M W/RFLX CULTURE, ROUTINE
Bilirubin, UA: NEGATIVE
Glucose, UA: NEGATIVE
Ketones, UA: NEGATIVE
Nitrite, UA: NEGATIVE
Protein,UA: NEGATIVE
RBC, UA: NEGATIVE
Specific Gravity, UA: 1.02 (ref 1.005–1.030)
Urobilinogen, Ur: 0.2 mg/dL (ref 0.2–1.0)
pH, UA: 6.5 (ref 5.0–7.5)

## 2019-06-12 LAB — MICROSCOPIC EXAMINATION
Bacteria, UA: NONE SEEN
RBC, Urine: NONE SEEN /hpf (ref 0–2)

## 2019-07-09 ENCOUNTER — Ambulatory Visit
Admission: RE | Admit: 2019-07-09 | Discharge: 2019-07-09 | Disposition: A | Discharge status: Home / Self Care | Payer: PPO | Source: Ambulatory Visit | Attending: Family Medicine | Admitting: Family Medicine

## 2019-07-09 DIAGNOSIS — Z1231 Encounter for screening mammogram for malignant neoplasm of breast: Secondary | ICD-10-CM | POA: Insufficient documentation

## 2019-07-26 ENCOUNTER — Telehealth: Payer: Self-pay | Admitting: Family Medicine

## 2019-07-26 NOTE — Chronic Care Management (AMB) (Signed)
  Chronic Care Management   Note  07/26/2019 Name: Alylah Blakney MRN: [id number] DOB: 25-Apr-1951  Geet Hosking Demo is a 69 y.o. year old female who is a primary care patient of [name], [place]. I reached out to [place] by phone today in response to a referral sent by Ms. Verlin Fester Fallaw's health plan.     Ms. Fiero was given information about Chronic Care Management services today including:  1. CCM service includes personalized support from designated clinical staff supervised by her physician, including individualized plan of care and coordination with other care providers 2. 24/7 contact phone numbers for assistance for urgent and routine care needs. 3. Service will only be billed when office clinical staff spend 20 minutes or more in a month to coordinate care. 4. Only one practitioner may furnish and bill the service in a calendar month. 5. The patient may stop CCM services at any time (effective at the end of the month) by phone call to the office staff. 6. The patient will be responsible for cost sharing (co-pay) of up to 20% of the service fee (after annual deductible is met).  Patient agreed to services and verbal consent obtained.   Follow up plan: Telephone appointment with care management team member scheduled for:08/24/2019  [name], [place], [place], [place] [address] Direct Dial: [phone] [email or web address] Website: [email or web address]

## 2019-08-24 ENCOUNTER — Ambulatory Visit (INDEPENDENT_AMBULATORY_CARE_PROVIDER_SITE_OTHER): Payer: PPO | Admitting: General Practice

## 2019-08-24 ENCOUNTER — Telehealth: Payer: PPO | Admitting: General Practice

## 2019-08-24 DIAGNOSIS — S83209A Unspecified tear of unspecified meniscus, current injury, unspecified knee, initial encounter: Secondary | ICD-10-CM

## 2019-08-24 DIAGNOSIS — E7800 Pure hypercholesterolemia, unspecified: Secondary | ICD-10-CM | POA: Diagnosis not present

## 2019-08-24 DIAGNOSIS — I10 Essential (primary) hypertension: Secondary | ICD-10-CM

## 2019-08-24 DIAGNOSIS — M7750 Other enthesopathy of unspecified foot: Secondary | ICD-10-CM

## 2019-08-24 NOTE — Chronic Care Management (AMB) (Signed)
Chronic Care Management   Initial Visit Note  08/24/2019 Name: Heather James MRN: [id number] DOB: 11-10-1950  Referred by: [name], PA-C Reason for referral : Chronic Care Management (Initial outreach: HTN/HLD)   Heather James is a 69 y.o. year old female who is a primary care patient of [name], [place]. The CCM team was consulted for assistance with chronic disease management and care coordination needs related to HTN and HLD  Review of patient status, including review of consultants reports, relevant laboratory and other test results, and collaboration with appropriate care team members and the patient's provider was performed as part of comprehensive patient evaluation and provision of chronic care management services.    SDOH (Social Determinants of Health) assessments performed: Yes See Care Plan activities for detailed interventions related to SDOH)  SDOH Interventions     Most Recent Value  SDOH Interventions  SDOH Interventions for the Following Domains  Physical Activity  Physical Activity Interventions  Other (Comments) [the patient walks some, has a torn meniscus and is working with the doctors, hopeful to be more active in the future]  Alcohol Brief Interventions/Follow-up  AUDIT Score <7 follow-up not indicated       Medications: Outpatient Encounter Medications as of 08/24/2019  Medication Sig   albuterol (PROVENTIL HFA;VENTOLIN HFA) 108 (90 Base) MCG/ACT inhaler Inhale 2 puffs into the lungs every 6 (six) hours as needed for wheezing or shortness of breath.   aspirin 81 MG tablet Take 81 mg by mouth daily.   atorvastatin (LIPITOR) 20 MG tablet Take 1 tablet (20 mg total) by mouth daily.   diclofenac sodium (VOLTAREN) 1 % GEL Apply 4 g topically 4 (four) times daily.   hydrochlorothiazide (HYDRODIURIL) 50 MG tablet Take 1 tablet (50 mg total) by mouth every other day.   meloxicam (MOBIC) 7.5 MG tablet Take 1 tablet (7.5 mg total)  by mouth daily.   mometasone (NASONEX) 50 MCG/ACT nasal spray Place 2 sprays into the nose daily.   raloxifene (EVISTA) 60 MG tablet Take 1 tablet (60 mg total) by mouth daily.   HYDROcodone-acetaminophen (NORCO/VICODIN) 5-325 MG tablet Take 1 tablet by mouth every 4 (four) hours as needed for moderate pain. (Patient not taking: Reported on 08/24/2019)   nitrofurantoin, macrocrystal-monohydrate, (MACROBID) 100 MG capsule Take 1 capsule (100 mg total) by mouth 2 (two) times daily. (Patient not taking: Reported on 08/24/2019)   potassium chloride SA (K-DUR,KLOR-CON) 20 MEQ tablet TK 1 T PO QD WF   No facility-administered encounter medications on file as of 08/24/2019.     Objective:   Goals Addressed            This Visit's Progress    RNCM: PT- "I can't exercise like I want to because of a torn meniscus"       CARE PLAN ENTRY (see longtitudinal plan of care for additional care plan information)  Current Barriers:   Chronic Disease Management support, education, and care coordination needs related to HTN and HLD  Clinical Goal(s) related to HTN and HLD:  Over the next 90 days, patient will:   Work with the care management team to address educational, disease management, and care coordination needs   Begin or continue self health monitoring activities as directed today Measure and record blood pressure 3 times per week and continue a heart healthy diet  Call provider office for new or worsened signs and symptoms Blood pressure findings outside established parameters and New or worsened symptom related to  HTN and HLD  Call care management team with questions or concerns  Verbalize basic understanding of patient centered plan of care established today  Interventions related to HTN and HLD:   Evaluation of current treatment plans and patient's adherence to plan as established by provider  Assessed patient understanding of disease states  Assessed patient's education and  care coordination needs  Provided disease specific education to patient   Collaborated with appropriate clinical care team members regarding patient needs  Patient Self Care Activities related to HTN and HLD:   Patient is unable to independently self-manage chronic health conditions  Initial goal documentation         Ms. Popovich was given information about Chronic Care Management services today including:  1. CCM service includes personalized support from designated clinical staff supervised by her physician, including individualized plan of care and coordination with other care providers 2. 24/7 contact phone numbers for assistance for urgent and routine care needs. 3. Service will only be billed when office clinical staff spend 20 minutes or more in a month to coordinate care. 4. Only one practitioner may furnish and bill the service in a calendar month. 5. The patient may stop CCM services at any time (effective at the end of the month) by phone call to the office staff. 6. The patient will be responsible for cost sharing (co-pay) of up to 20% of the service fee (after annual deductible is met).  Patient agreed to services and verbal consent obtained.   Plan:   The care management team will reach out to the patient again over the next 60 days.   [name] RN, MSN, [facility] Mobile: [phone]

## 2019-08-24 NOTE — Patient Instructions (Signed)
Visit Information  Goals Addressed            This Visit's Progress   . RNCM: PT- "I can't exercise like I want to because of a torn meniscus"       CARE PLAN ENTRY (see longtitudinal plan of care for additional care plan information)  Current Barriers:  . Chronic Disease Management support, education, and care coordination needs related to HTN and HLD  Clinical Goal(s) related to HTN and HLD:  Over the next 90 days, patient will:  . Work with the care management team to address educational, disease management, and care coordination needs  . Begin or continue self health monitoring activities as directed today Measure and record blood pressure 3 times per week and continue a heart healthy diet . Call provider office for new or worsened signs and symptoms Blood pressure findings outside established parameters and New or worsened symptom related to HTN and HLD . Call care management team with questions or concerns . Verbalize basic understanding of patient centered plan of care established today  Interventions related to HTN and HLD:  . Evaluation of current treatment plans and patient's adherence to plan as established by provider . Assessed patient understanding of disease states . Assessed patient's education and care coordination needs . Provided disease specific education to patient  . Collaborated with appropriate clinical care team members regarding patient needs  Patient Self Care Activities related to HTN and HLD:  . Patient is unable to independently self-manage chronic health conditions  Initial goal documentation        Heather James was given information about Chronic Care Management services today including:  1. CCM service includes personalized support from designated clinical staff supervised by her physician, including individualized plan of care and coordination with other care providers 2. 24/7 contact phone numbers for assistance for urgent and routine care  needs. 3. Service will only be billed when office clinical staff spend 20 minutes or more in a month to coordinate care. 4. Only one practitioner may furnish and bill the service in a calendar month. 5. The patient may stop CCM services at any time (effective at the end of the month) by phone call to the office staff. 6. The patient will be responsible for cost sharing (co-pay) of up to 20% of the service fee (after annual deductible is met).  Patient agreed to services and verbal consent obtained.   Patient verbalizes understanding of instructions provided today.   The care management team will reach out to the patient again over the next 60 days.   [name] RN, MSN, [facility] Mobile: [phone]

## 2019-08-31 DIAGNOSIS — H2513 Age-related nuclear cataract, bilateral: Secondary | ICD-10-CM | POA: Diagnosis not present

## 2019-10-17 ENCOUNTER — Ambulatory Visit (INDEPENDENT_AMBULATORY_CARE_PROVIDER_SITE_OTHER): Payer: PPO | Admitting: General Practice

## 2019-10-17 ENCOUNTER — Telehealth: Payer: Self-pay | Admitting: General Practice

## 2019-10-17 DIAGNOSIS — E7800 Pure hypercholesterolemia, unspecified: Secondary | ICD-10-CM | POA: Diagnosis not present

## 2019-10-17 DIAGNOSIS — I10 Essential (primary) hypertension: Secondary | ICD-10-CM | POA: Diagnosis not present

## 2019-10-17 DIAGNOSIS — S83209A Unspecified tear of unspecified meniscus, current injury, unspecified knee, initial encounter: Secondary | ICD-10-CM

## 2019-10-17 NOTE — Patient Instructions (Signed)
Visit Information  Goals Addressed            This Visit's Progress   . RNCM: PT- "I can't exercise like I want to because of a torn meniscus"       CARE PLAN ENTRY (see longtitudinal plan of care for additional care plan information)  Current Barriers:  . Chronic Disease Management support, education, and care coordination needs related to HTN and HLD  Clinical Goal(s) related to HTN and HLD:  Over the next 120 days, patient will:  . Work with the care management team to address educational, disease management, and care coordination needs  . Begin or continue self health monitoring activities as directed today Measure and record blood pressure 3 times per week and continue a heart healthy diet . Call provider office for new or worsened signs and symptoms Blood pressure findings outside established parameters and New or worsened symptom related to HTN and HLD . Call care management team with questions or concerns . Verbalize basic understanding of patient centered plan of care established today  Interventions related to HTN and HLD:  . Evaluation of current treatment plans and patient's adherence to plan as established by provider.  The patient verbalized she is doing great. She is not having to have surgery due to the torn meniscus. It seems to be healing and doing much better. She is happy that she is not going to have to have surgery. This has allowed her to be more active.  . Assessed patient understanding of disease states.  The patient has a good understanding of her chronic diseases. States that she is watching her blood pressures: Range in usually 120/70 . Assessed patient's education and care coordination needs:  The patient denies any educational needs at this time. The patient is feeling great and happy about the change in the weather so she can get out and enjoy being outside  . Provided disease specific education to patient.  Review of Heart Healthy diet.  The patient is  watching her diet and following recommendations of provider. . Assessed new appointment upcoming with provider: Sees pcp on 6-14.  The patient has CCM contact information for concerns or needs.  Will continue to monitor for changes.  . Assessed activity level. The patient is able to do more activity since the torn meniscus is healing and she is not having inflammation like before. She is happy for changes in the weather.  Nash Dimmer with appropriate clinical care team members regarding patient needs  Patient Self Care Activities related to HTN and HLD:  . Patient is unable to independently self-manage chronic health conditions  Please see past updates related to this goal by clicking on the "Past Updates" button in the selected goal         Patient verbalizes understanding of instructions provided today.   The care management team will reach out to the patient again over the next 60 to 90 days.   [name] RN, MSN, [facility] Mobile: [phone]

## 2019-10-17 NOTE — Chronic Care Management (AMB) (Signed)
Chronic Care Management   Follow Up Note   10/17/2019 Name: Heather James MRN: [id number] DOB: [birth year]  Referred by: [name], PA-C Reason for referral : Chronic Care Management (Follow up: HTN/HLD)   Heather James is a 69 y.o. year old female who is a primary care patient of [name], [place]. The CCM team was consulted for assistance with chronic disease management and care coordination needs.    Review of patient status, including review of consultants reports, relevant laboratory and other test results, and collaboration with appropriate care team members and the patient's provider was performed as part of comprehensive patient evaluation and provision of chronic care management services.    SDOH (Social Determinants of Health) assessments performed: Yes- activity level improving See Care Plan activities for detailed interventions related to [facility])     Outpatient Encounter Medications as of 10/17/2019  Medication Sig   albuterol (PROVENTIL HFA;VENTOLIN HFA) 108 (90 Base) MCG/ACT inhaler Inhale 2 puffs into the lungs every 6 (six) hours as needed for wheezing or shortness of breath.   aspirin 81 MG tablet Take 81 mg by mouth daily.   atorvastatin (LIPITOR) 20 MG tablet Take 1 tablet (20 mg total) by mouth daily.   diclofenac sodium (VOLTAREN) 1 % GEL Apply 4 g topically 4 (four) times daily.   hydrochlorothiazide (HYDRODIURIL) 50 MG tablet Take 1 tablet (50 mg total) by mouth every other day.   HYDROcodone-acetaminophen (NORCO/VICODIN) 5-325 MG tablet Take 1 tablet by mouth every 4 (four) hours as needed for moderate pain. (Patient not taking: Reported on 08/24/2019)   meloxicam (MOBIC) 7.5 MG tablet Take 1 tablet (7.5 mg total) by mouth daily.   mometasone (NASONEX) 50 MCG/ACT nasal spray Place 2 sprays into the nose daily.   nitrofurantoin, macrocrystal-monohydrate, (MACROBID) 100 MG capsule Take 1 capsule (100 mg total) by mouth 2 (two)  times daily. (Patient not taking: Reported on 08/24/2019)   potassium chloride SA (K-DUR,KLOR-CON) 20 MEQ tablet TK 1 T PO QD WF   raloxifene (EVISTA) 60 MG tablet Take 1 tablet (60 mg total) by mouth daily.   No facility-administered encounter medications on file as of 10/17/2019.     Objective:  BP Readings from Last 3 Encounters:  06/07/19 124/78  06/07/19 124/78  02/05/19 (!) 115/55   Lab Results  Component Value Date   CHOL 137 06/07/2019   HDL 59 06/07/2019   LDLCALC 62 06/07/2019   TRIG 80 06/07/2019   CHOLHDL 4.0 06/03/2017    Goals Addressed            This Visit's Progress    RNCM: PT- "I can't exercise like I want to because of a torn meniscus"       CARE PLAN ENTRY (see longtitudinal plan of care for additional care plan information)  Current Barriers:   Chronic Disease Management support, education, and care coordination needs related to HTN and HLD  Clinical Goal(s) related to HTN and HLD:  Over the next 120 days, patient will:   Work with the care management team to address educational, disease management, and care coordination needs   Begin or continue self health monitoring activities as directed today Measure and record blood pressure 3 times per week and continue a heart healthy diet  Call provider office for new or worsened signs and symptoms Blood pressure findings outside established parameters and New or worsened symptom related to HTN and HLD  Call care management team with questions or concerns  Verbalize  basic understanding of patient centered plan of care established today  Interventions related to HTN and HLD:   Evaluation of current treatment plans and patient's adherence to plan as established by provider.  The patient verbalized she is doing great. She is not having to have surgery due to the torn meniscus. It seems to be healing and doing much better. She is happy that she is not going to have to have surgery. This has allowed her to  be more active.   Assessed patient understanding of disease states.  The patient has a good understanding of her chronic diseases. States that she is watching her blood pressures: Range in usually 120/70  Assessed patient's education and care coordination needs:  The patient denies any educational needs at this time. The patient is feeling great and happy about the change in the weather so she can get out and enjoy being outside   Provided disease specific education to patient.  Review of Heart Healthy diet.  The patient is watching her diet and following recommendations of provider.  Assessed new appointment upcoming with provider: Sees pcp on 6-14.  The patient has CCM contact information for concerns or needs.  Will continue to monitor for changes.   Assessed activity level. The patient is able to do more activity since the torn meniscus is healing and she is not having inflammation like before. She is happy for changes in the weather.   Collaborated with appropriate clinical care team members regarding patient needs  Patient Self Care Activities related to HTN and HLD:   Patient is unable to independently self-manage chronic health conditions  Please see past updates related to this goal by clicking on the "Past Updates" button in the selected goal          Plan:   The care management team will reach out to the patient again over the next 60 to 90 days.    [name] RN, MSN, [facility] Mobile: [phone]

## 2019-12-10 ENCOUNTER — Ambulatory Visit (INDEPENDENT_AMBULATORY_CARE_PROVIDER_SITE_OTHER): Payer: PPO | Admitting: Family Medicine

## 2019-12-10 ENCOUNTER — Encounter: Payer: Self-pay | Admitting: Family Medicine

## 2019-12-10 ENCOUNTER — Other Ambulatory Visit: Payer: Self-pay

## 2019-12-10 VITALS — BP 120/80 | HR 79 | Temp 98.0°F | Wt 238.0 lb

## 2019-12-10 DIAGNOSIS — E7800 Pure hypercholesterolemia, unspecified: Secondary | ICD-10-CM | POA: Diagnosis not present

## 2019-12-10 DIAGNOSIS — I10 Essential (primary) hypertension: Secondary | ICD-10-CM

## 2019-12-10 DIAGNOSIS — Z6841 Body Mass Index (BMI) 40.0 and over, adult: Secondary | ICD-10-CM

## 2019-12-10 LAB — MICROALBUMIN, URINE WAIVED
Creatinine, Urine Waived: 200 mg/dL (ref 10–300)
Microalb, Ur Waived: 30 mg/L — ABNORMAL HIGH (ref 0–19)
Microalb/Creat Ratio: <30 mg/g

## 2019-12-10 IMAGING — MG MM DIGITAL DIAGNOSTIC UNILAT*R* W/ TOMO W/ CAD
6 series · 6 of 14 positions shown · non-contrast
Comparison: 07/04/2017

CLINICAL DATA: 66-year-old patient recalled from recent screening
mammogram for evaluation of a possible mass in the right breast.

EXAM:
2D DIGITAL DIAGNOSTIC RIGHT MAMMOGRAM WITH ADJUNCT TOMO
ULTRASOUND RIGHT BREAST

[R MLO]
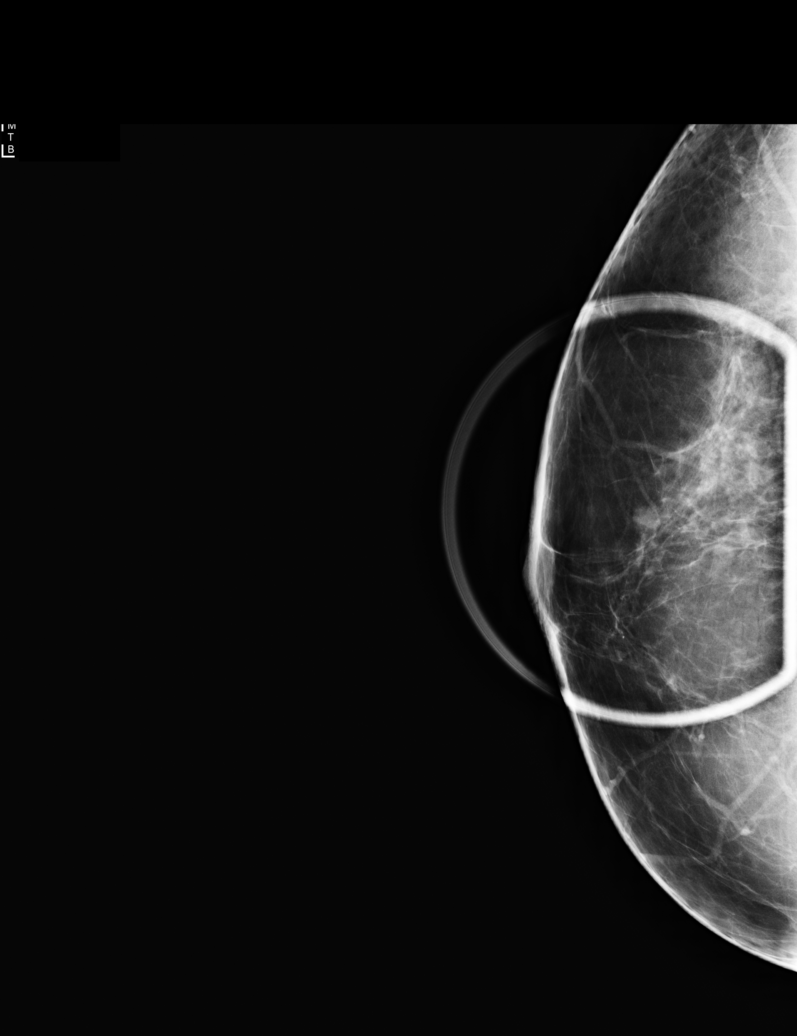

[R MLO synth-2D]
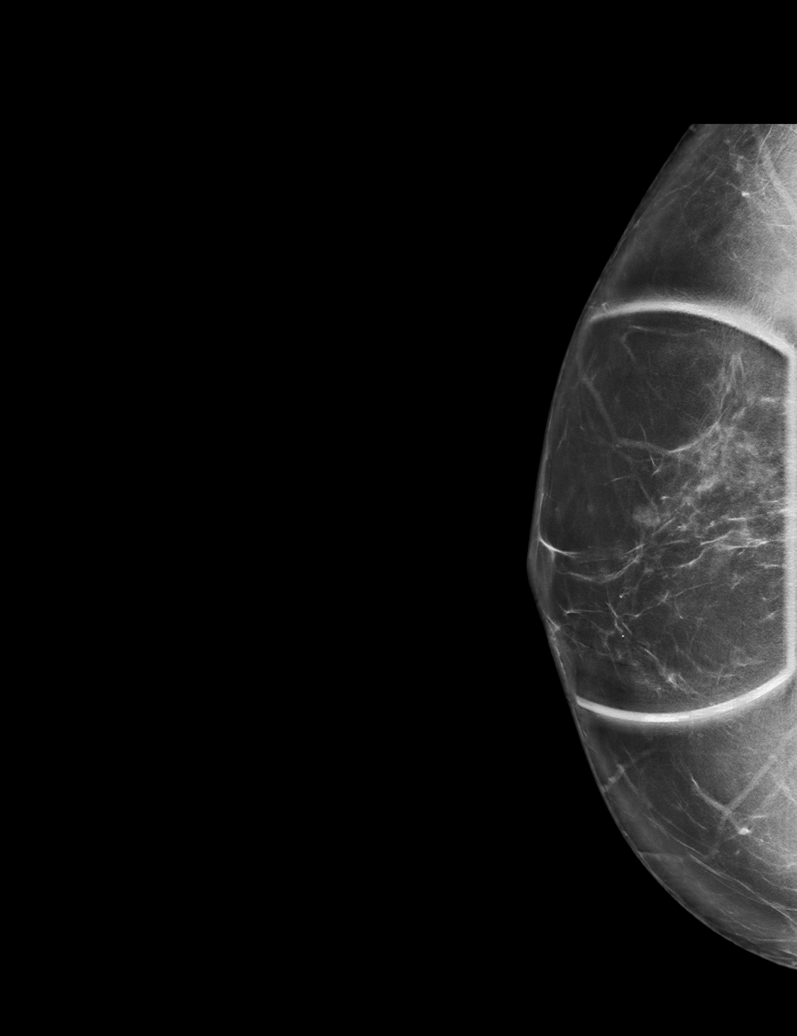

[R CC]
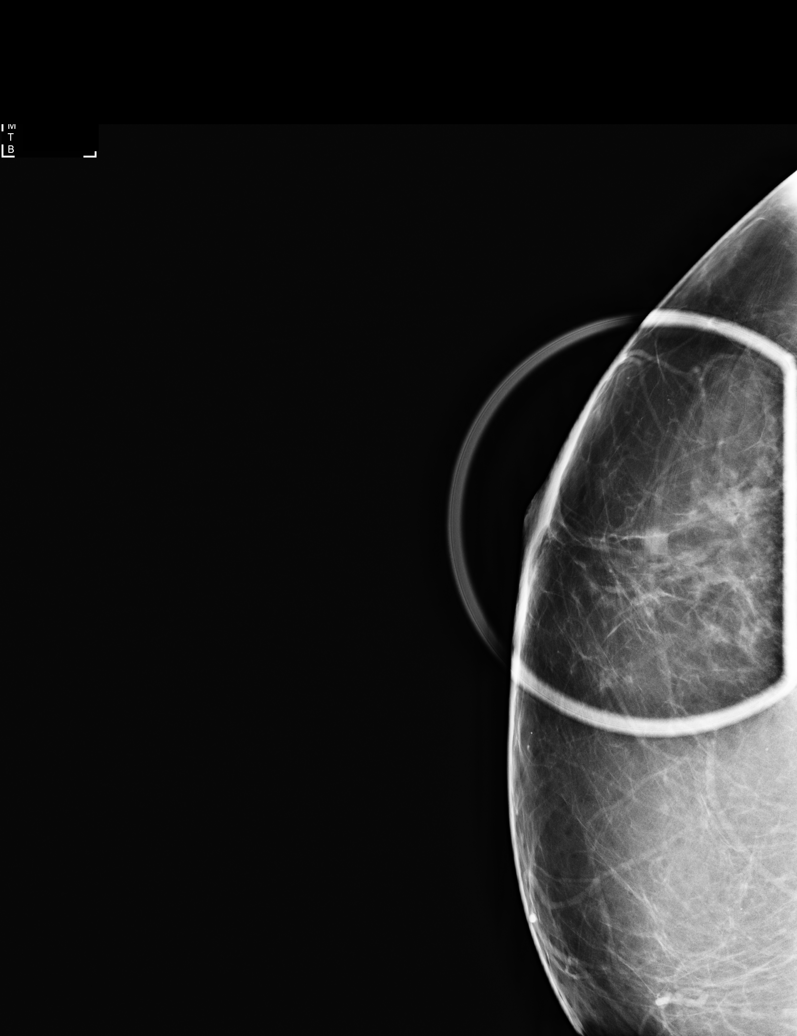

[R CC synth-2D]
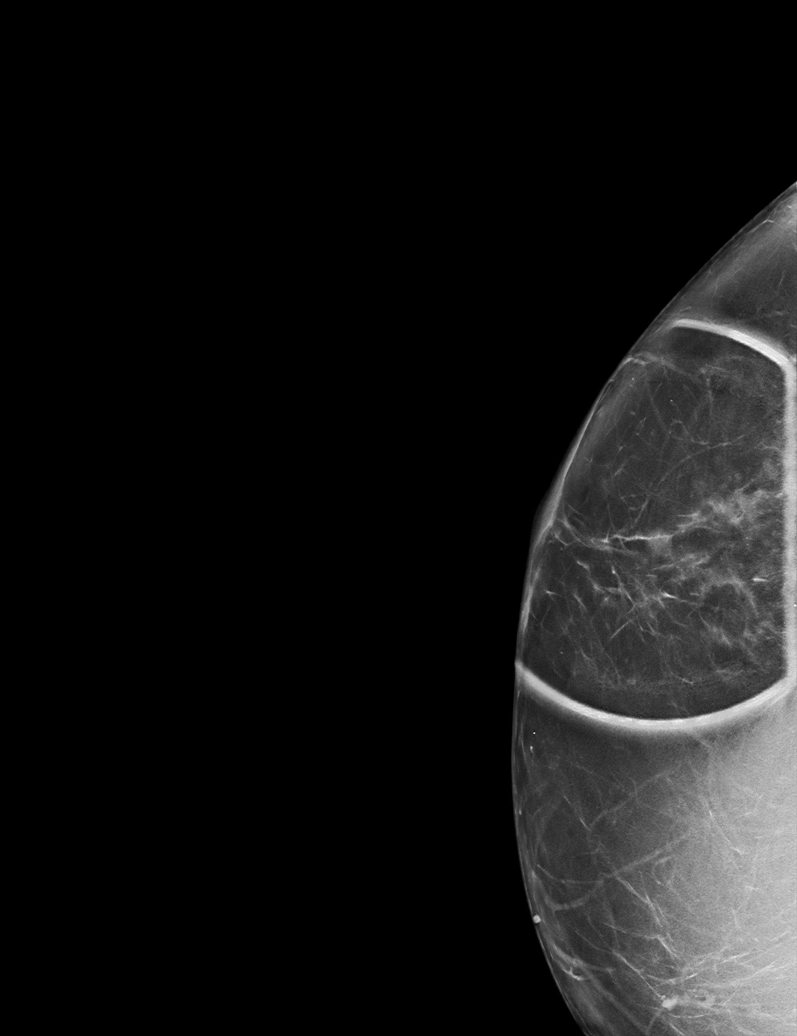

[R MLO tomo · tomo slice 45/89.0]
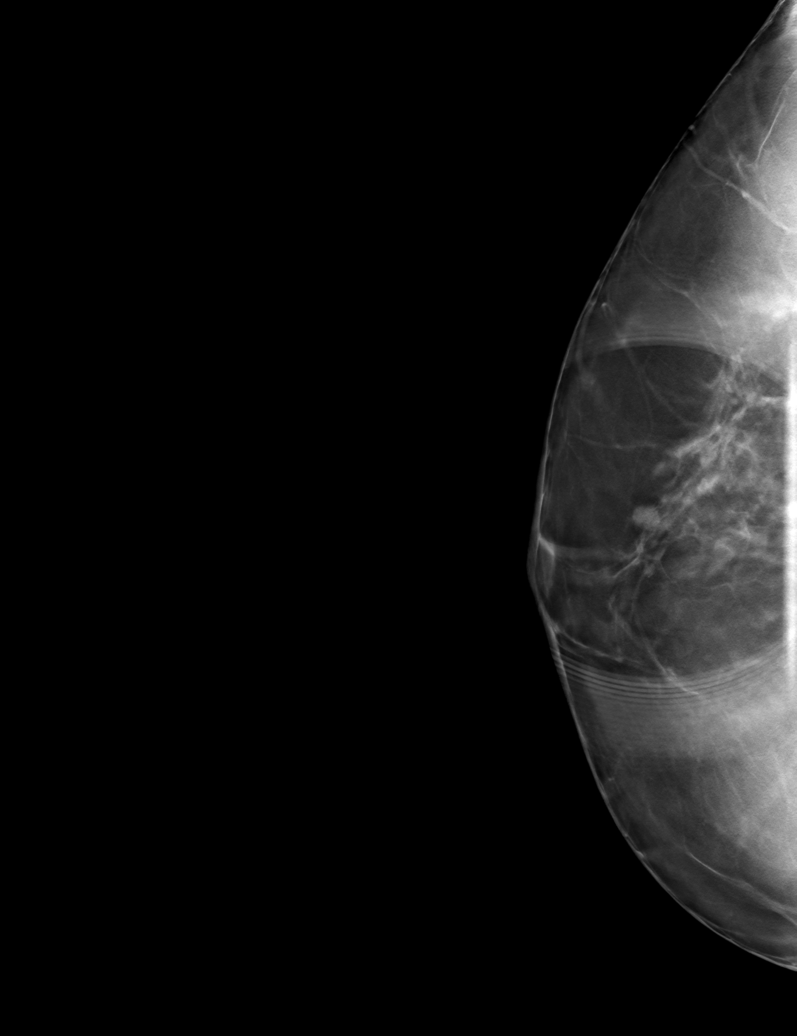

[R CC tomo · tomo slice 39/78.0]
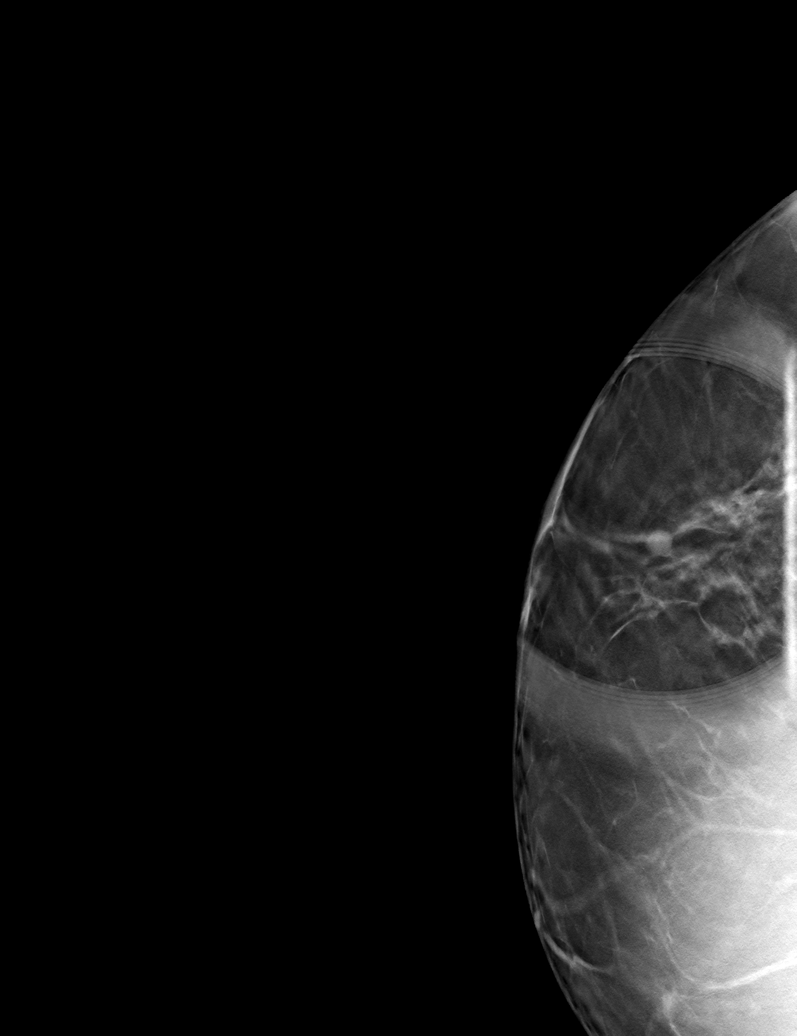

[6 of 14 positions shown; findings below may reference images not displayed]

ACR Breast Density Category b: There are scattered areas of
fibroglandular density.
FINDINGS: Focal spot compression views of the retroareolar right breast
confirm an approximately 5 mm circumscribed oval mass.

Targeted ultrasound is performed, showing a 5 mm circumscribed cyst
in the 12 o'clock retroareolar right breast. No solid or suspicious
masses identified on ultrasound.
IMPRESSION: Benign 5 mm cyst in the retroareolar right breast. No evidence of
malignancy.

RECOMMENDATION:
Screening mammogram in one year.(Code:[id number])

I have discussed the findings and recommendations with the patient.
Results were also provided in writing at the conclusion of the
visit. If applicable, a reminder letter will be sent to the patient
regarding the next appointment.

BI-RADS CATEGORY  2: Benign.

## 2019-12-10 MED ORDER — ALBUTEROL SULFATE HFA 108 (90 BASE) MCG/ACT IN AERS: 2.0000 | INHALATION_SPRAY | Freq: Four times a day (QID) | RESPIRATORY_TRACT | 6 refills | Status: DC | PRN

## 2019-12-10 MED ORDER — MOMETASONE FUROATE 50 MCG/ACT NA SUSP: 2.0000 | Freq: Every day | NASAL | 12 refills | Status: DC

## 2019-12-10 MED ORDER — ATORVASTATIN CALCIUM 20 MG PO TABS: 20.0000 mg | ORAL_TABLET | Freq: Every day | ORAL | 1 refills | Status: DC

## 2019-12-10 MED ORDER — HYDROCHLOROTHIAZIDE 50 MG PO TABS: 50.0000 mg | ORAL_TABLET | ORAL | 1 refills | Status: DC

## 2019-12-10 NOTE — Assessment & Plan Note (Signed)
Under good control on current regimen. Continue current regimen. Continue to monitor. Call with any concerns. Refills given. Labs drawn today.   

## 2019-12-10 NOTE — Assessment & Plan Note (Signed)
Congratulated patient on 2lb weight loss. Continue diet and exercise with goal of losing 1-2 lbs per week.

## 2019-12-10 NOTE — Progress Notes (Signed)
BP 120/80   Pulse 79   Temp 98 F (36.7 C) (Oral)   Wt 238 lb (108 kg)   LMP  (LMP Unknown)   SpO2 96%   BMI 42.16 kg/m    Subjective:    Patient ID: Heather James, female    DOB: 06/28/1951, 69 y.o.   MRN: [id number]  HPI: Heather James is a 69 y.o. female  Chief Complaint  Patient presents with  . Hypertension  . Hyperlipidemia  . Medication Refill    albuterol inhaler and nasonex nasal spray   HYPERTENSION / HYPERLIPIDEMIA Satisfied with current treatment? yes Duration of hypertension: chronic BP monitoring frequency: not checking BP medication side effects: no Past BP meds: HCTZ Duration of hyperlipidemia: chronic Cholesterol medication side effects: no Cholesterol supplements: none Past cholesterol medications: atorvastatin Medication compliance: excellent compliance Aspirin: yes Recent stressors: no Recurrent headaches: no Visual changes: no Palpitations: no Dyspnea: no Chest pain: no Lower extremity edema: no Dizzy/lightheaded: no   Relevant past medical, surgical, family and social history reviewed and updated as indicated. Interim medical history since our last visit reviewed. Allergies and medications reviewed and updated.  Review of Systems  Constitutional: Negative.   HENT: Negative.   Respiratory: Negative.   Cardiovascular: Negative.   Musculoskeletal: Negative.   Psychiatric/Behavioral: Negative.     Per HPI unless specifically indicated above     Objective:    BP 120/80   Pulse 79   Temp 98 F (36.7 C) (Oral)   Wt 238 lb (108 kg)   LMP  (LMP Unknown)   SpO2 96%   BMI 42.16 kg/m   Wt Readings from Last 3 Encounters:  12/10/19 238 lb (108 kg)  06/07/19 239 lb 12.8 oz (108.8 kg)  06/07/19 239 lb 12.8 oz (108.8 kg)    Physical Exam Vitals and nursing note reviewed.  Constitutional:      General: She is not in acute distress.    Appearance: Normal appearance. She is obese. She is not ill-appearing, toxic-appearing  or diaphoretic.  HENT:     Head: Normocephalic and atraumatic.     Right Ear: External ear normal.     Left Ear: External ear normal.     Nose: Nose normal.     Mouth/Throat:     Mouth: Mucous membranes are moist.     Pharynx: Oropharynx is clear.  Eyes:     General: No scleral icterus.       Right eye: No discharge.        Left eye: No discharge.     Extraocular Movements: Extraocular movements intact.     Conjunctiva/sclera: Conjunctivae normal.     Pupils: Pupils are equal, round, and reactive to light.  Cardiovascular:     Rate and Rhythm: Normal rate and regular rhythm.     Pulses: Normal pulses.     Heart sounds: Normal heart sounds. No murmur heard.  No friction rub. No gallop.   Pulmonary:     Effort: Pulmonary effort is normal. No respiratory distress.     Breath sounds: Normal breath sounds. No stridor. No wheezing, rhonchi or rales.  Chest:     Chest wall: No tenderness.  Musculoskeletal:        General: Normal range of motion.     Cervical back: Normal range of motion and neck supple.  Skin:    General: Skin is warm and dry.     Capillary Refill: Capillary refill takes less than 2 seconds.  Coloration: Skin is not jaundiced or pale.     Findings: No bruising, erythema, lesion or rash.  Neurological:     General: No focal deficit present.     Mental Status: She is alert and oriented to person, place, and time. Mental status is at baseline.  Psychiatric:        Mood and Affect: Mood normal.        Behavior: Behavior normal.        Thought Content: Thought content normal.        Judgment: Judgment normal.     Results for orders placed or performed in visit on 06/07/19  Microscopic Examination   URINE  Result Value Ref Range   WBC, UA 0-5 0 - 5 /hpf   RBC None seen 0 - 2 /hpf   Epithelial Cells (non renal) 0-10 0 - 10 /hpf   Mucus, UA Present Not Estab.   Bacteria, UA None seen None seen/Few  Urine Culture, Reflex   URINE  Result Value Ref Range    Urine Culture, Routine Final report (A)    Organism ID, Bacteria Comment (A)    ORGANISM ID, BACTERIA Escherichia coli (A)    Antimicrobial Susceptibility Comment   CBC with Differential/Platelet out  Result Value Ref Range   WBC 5.8 3.4 - 10.8 x10E3/uL   RBC 5.07 3.77 - 5.28 x10E6/uL   Hemoglobin 15.6 11.1 - 15.9 g/dL   Hematocrit 45.8 34.0 - 46.6 %   MCV 90 79 - 97 fL   MCH 30.8 26.6 - 33.0 pg   MCHC 34.1 31 - 35 g/dL   RDW 12.5 11.7 - 15.4 %   Platelets 277 150 - 450 x10E3/uL   Neutrophils 66 Not Estab. %   Lymphs 25 Not Estab. %   Monocytes 8 Not Estab. %   Eos 1 Not Estab. %   Basos 0 Not Estab. %   Neutrophils Absolute 3.8 1 - 7 x10E3/uL   Lymphocytes Absolute 1.4 0 - 3 x10E3/uL   Monocytes Absolute 0.5 0 - 0 x10E3/uL   EOS (ABSOLUTE) 0.1 0.0 - 0.4 x10E3/uL   Basophils Absolute 0.0 0 - 0 x10E3/uL   Immature Granulocytes 0 Not Estab. %   Immature Grans (Abs) 0.0 0.0 - 0.1 x10E3/uL  Comprehensive metabolic panel  Result Value Ref Range   Glucose 94 65 - 99 mg/dL   BUN 15 8 - 27 mg/dL   Creatinine, Ser 0.88 0.57 - 1.00 mg/dL   GFR calc non Af Amer 67 >59 mL/min/1.73   GFR calc Af Amer 78 >59 mL/min/1.73   BUN/Creatinine Ratio 17 12 - 28   Sodium 144 134 - 144 mmol/L   Potassium 4.0 3.5 - 5.2 mmol/L   Chloride 103 96 - 106 mmol/L   CO2 28 20 - 29 mmol/L   Calcium 9.9 8.7 - 10.3 mg/dL   Total Protein 6.7 6.0 - 8.5 g/dL   Albumin 4.6 3.8 - 4.8 g/dL   Globulin, Total 2.1 1.5 - 4.5 g/dL   Albumin/Globulin Ratio 2.2 1.2 - 2.2   Bilirubin Total 1.1 0.0 - 1.2 mg/dL   Alkaline Phosphatase 116 39 - 117 IU/L   AST 28 0 - 40 IU/L   ALT 30 0 - 32 IU/L  Lipid Panel w/o Chol/HDL Ratio out  Result Value Ref Range   Cholesterol, Total 137 100 - 199 mg/dL   Triglycerides 80 0 - 149 mg/dL   HDL 59 >39 mg/dL   VLDL Cholesterol Cal 16  5 - 40 mg/dL   LDL Chol Calc (NIH) 62 0 - 99 mg/dL  UA/M w/rflx Culture, Routine   Specimen: Urine   URINE  Result Value Ref Range   Specific  Gravity, UA 1.020 1.005 - 1.030   pH, UA 6.5 5.0 - 7.5   Color, UA Yellow Yellow   Appearance Ur Clear Clear   Leukocytes,UA 1+ (A) Negative   Protein,UA Negative Negative/Trace   Glucose, UA Negative Negative   Ketones, UA Negative Negative   RBC, UA Negative Negative   Bilirubin, UA Negative Negative   Urobilinogen, Ur 0.2 0.2 - 1.0 mg/dL   Nitrite, UA Negative Negative   Microscopic Examination See below:    Urinalysis Reflex Comment       Assessment & Plan:   Problem List Items Addressed This Visit      Cardiovascular and Mediastinum   Hypertension - Primary    Under good control on current regimen. Continue current regimen. Continue to monitor. Call with any concerns. Refills given. Labs drawn today.        Relevant Medications   atorvastatin (LIPITOR) 20 MG tablet   hydrochlorothiazide (HYDRODIURIL) 50 MG tablet   Other Relevant Orders   Comprehensive metabolic panel   Microalbumin, Urine Waived     Other   Morbid obesity ([place])    Congratulated patient on 2lb weight loss. Continue diet and exercise with goal of losing 1-2 lbs per week.       BMI 40.0-44.9, adult [facility])    Congratulated patient on 2lb weight loss. Continue diet and exercise with goal of losing 1-2 lbs per week.       Hypercholesteremia    Under good control on current regimen. Continue current regimen. Continue to monitor. Call with any concerns. Refills given. Labs drawn today.        Relevant Medications   atorvastatin (LIPITOR) 20 MG tablet   hydrochlorothiazide (HYDRODIURIL) 50 MG tablet   Other Relevant Orders   Comprehensive metabolic panel   Lipid Panel w/o Chol/HDL Ratio       Follow up plan: Return in about 6 months (around 06/10/2020) for Wellness/physical with Apolonio Schneiders.

## 2019-12-11 LAB — COMPREHENSIVE METABOLIC PANEL
ALT: 31 IU/L (ref 0–32)
AST: 30 IU/L (ref 0–40)
Albumin/Globulin Ratio: 2 (ref 1.2–2.2)
Albumin: 4.4 g/dL (ref 3.8–4.8)
Alkaline Phosphatase: 126 IU/L — ABNORMAL HIGH (ref 48–121)
BUN/Creatinine Ratio: 21 (ref 12–28)
BUN: 19 mg/dL (ref 8–27)
Bilirubin Total: 1.1 mg/dL (ref 0.0–1.2)
CO2: 25 mmol/L (ref 20–29)
Calcium: 10.1 mg/dL (ref 8.7–10.3)
Chloride: 103 mmol/L (ref 96–106)
Creatinine, Ser: 0.92 mg/dL (ref 0.57–1.00)
GFR calc Af Amer: 73 mL/min/{1.73_m2} (ref >59)
GFR calc non Af Amer: 64 mL/min/{1.73_m2} (ref >59)
Globulin, Total: 2.2 g/dL (ref 1.5–4.5)
Glucose: 98 mg/dL (ref 65–99)
Potassium: 4.7 mmol/L (ref 3.5–5.2)
Sodium: 143 mmol/L (ref 134–144)
Total Protein: 6.6 g/dL (ref 6.0–8.5)

## 2019-12-11 LAB — LIPID PANEL W/O CHOL/HDL RATIO
Cholesterol, Total: 131 mg/dL (ref 100–199)
HDL: 56 mg/dL (ref >39)
LDL Chol Calc (NIH): 61 mg/dL (ref 0–99)
Triglycerides: 67 mg/dL (ref 0–149)
VLDL Cholesterol Cal: 14 mg/dL (ref 5–40)

## 2019-12-26 ENCOUNTER — Telehealth: Payer: Self-pay | Admitting: General Practice

## 2019-12-26 ENCOUNTER — Ambulatory Visit (INDEPENDENT_AMBULATORY_CARE_PROVIDER_SITE_OTHER): Payer: PPO | Admitting: General Practice

## 2019-12-26 DIAGNOSIS — I10 Essential (primary) hypertension: Secondary | ICD-10-CM | POA: Diagnosis not present

## 2019-12-26 DIAGNOSIS — E7800 Pure hypercholesterolemia, unspecified: Secondary | ICD-10-CM | POA: Diagnosis not present

## 2019-12-26 NOTE — Chronic Care Management (AMB) (Signed)
Chronic Care Management   Follow Up Note   12/26/2019 Name: Jeriann Sayres MRN: [id number] DOB: 04/24/1951  Referred by: [name], PA-C Reason for referral : Chronic Care Management (Follow up: RNCM Chronic Disease Management and Care Coordination needs)   Macala Baldonado is a 69 y.o. year old female who is a primary care patient of [name], [place]. The CCM team was consulted for assistance with chronic disease management and care coordination needs.    Review of patient status, including review of consultants reports, relevant laboratory and other test results, and collaboration with appropriate care team members and the patient's provider was performed as part of comprehensive patient evaluation and provision of chronic care management services.    SDOH (Social Determinants of Health) assessments performed: Yes See Care Plan activities for detailed interventions related to [facility])     Outpatient Encounter Medications as of 12/26/2019  Medication Sig  . albuterol (VENTOLIN HFA) 108 (90 Base) MCG/ACT inhaler Inhale 2 puffs into the lungs every 6 (six) hours as needed for wheezing or shortness of breath.  Marland Kitchen aspirin 81 MG tablet Take 81 mg by mouth daily.  Marland Kitchen atorvastatin (LIPITOR) 20 MG tablet Take 1 tablet (20 mg total) by mouth daily.  . diclofenac sodium (VOLTAREN) 1 % GEL Apply 4 g topically 4 (four) times daily.  . hydrochlorothiazide (HYDRODIURIL) 50 MG tablet Take 1 tablet (50 mg total) by mouth every other day.  Marland Kitchen HYDROcodone-acetaminophen (NORCO/VICODIN) 5-325 MG tablet Take 1 tablet by mouth every 4 (four) hours as needed for moderate pain. (Patient not taking: Reported on 08/24/2019)  . meloxicam (MOBIC) 7.5 MG tablet Take 1 tablet (7.5 mg total) by mouth daily.  . mometasone (NASONEX) 50 MCG/ACT nasal spray Place 2 sprays into the nose daily.  . potassium chloride SA (K-DUR,KLOR-CON) 20 MEQ tablet TK 1 T PO QD WF  . raloxifene (EVISTA) 60 MG tablet  Take 1 tablet (60 mg total) by mouth daily.   No facility-administered encounter medications on file as of 12/26/2019.     Objective:  BP Readings from Last 3 Encounters:  12/10/19 120/80  06/07/19 124/78  06/07/19 124/78    Goals Addressed            This Visit's Progress   . RNCM: PT- "I can't exercise like I want to because of a torn meniscus"       CARE PLAN ENTRY (see longtitudinal plan of care for additional care plan information)  Current Barriers:  . Chronic Disease Management support, education, and care coordination needs related to HTN and HLD  Clinical Goal(s) related to HTN and HLD:  Over the next 120 days, patient will:  . Work with the care management team to address educational, disease management, and care coordination needs  . Begin or continue self health monitoring activities as directed today Measure and record blood pressure 3 times per week and continue a heart healthy diet . Call provider office for new or worsened signs and symptoms Blood pressure findings outside established parameters and New or worsened symptom related to HTN and HLD . Call care management team with questions or concerns . Verbalize basic understanding of patient centered plan of care established today  Interventions related to HTN and HLD:  . Evaluation of current treatment plans and patient's adherence to plan as established by provider.  The patient verbalized she is doing great. She is not having to have surgery due to the torn meniscus. It seems to be  healing and doing much better. She is happy that she is not going to have to have surgery. This has allowed her to be more active.  . Assessed patient understanding of disease states.  The patient has a good understanding of her chronic diseases. States that she is watching her blood pressures: Range in usually 120/70.  The patients blood pressures remain stable. Saw pcp recently and all values were good and lab test were normal.  The  patient will continue with the current regimen.  . Assessed patient's education and care coordination needs:  The patient denies any educational needs at this time. The patient is feeling great and happy about the change in the weather so she can get out and enjoy being outside.  The patient states she just needs her albuterol inhaler refilled. Looked in the chart and it looks like the albuterol has been refilled. Provided information to the patient. The patient is going to call [facility] and inquire about her prescription.  . Provided disease specific education to patient.  Review of Heart Healthy diet.  The patient is watching her diet and following recommendations of provider. . Assessed appointments.  The patient sees the pcp 06-09-2020.  The patient has CCM contact information for concerns or needs.  Will continue to monitor for changes.  . Assessed activity level. The patient is able to do more activity since the torn meniscus is healing and she is not having inflammation like before. She is happy for changes in the weather.  Nash Dimmer with appropriate clinical care team members regarding patient needs  Patient Self Care Activities related to HTN and HLD:  . Patient is unable to independently self-manage chronic health conditions  Please see past updates related to this goal by clicking on the "Past Updates" button in the selected goal          Plan:   The care management team will reach out to the patient again over the next 60 days.    [name] RN, MSN, [facility] Mobile: [phone]

## 2019-12-26 NOTE — Patient Instructions (Signed)
Visit Information  Goals Addressed            This Visit's Progress   . RNCM: PT- "I can't exercise like I want to because of a torn meniscus"       CARE PLAN ENTRY (see longtitudinal plan of care for additional care plan information)  Current Barriers:  . Chronic Disease Management support, education, and care coordination needs related to HTN and HLD  Clinical Goal(s) related to HTN and HLD:  Over the next 120 days, patient will:  . Work with the care management team to address educational, disease management, and care coordination needs  . Begin or continue self health monitoring activities as directed today Measure and record blood pressure 3 times per week and continue a heart healthy diet . Call provider office for new or worsened signs and symptoms Blood pressure findings outside established parameters and New or worsened symptom related to HTN and HLD . Call care management team with questions or concerns . Verbalize basic understanding of patient centered plan of care established today  Interventions related to HTN and HLD:  . Evaluation of current treatment plans and patient's adherence to plan as established by provider.  The patient verbalized she is doing great. She is not having to have surgery due to the torn meniscus. It seems to be healing and doing much better. She is happy that she is not going to have to have surgery. This has allowed her to be more active.  . Assessed patient understanding of disease states.  The patient has a good understanding of her chronic diseases. States that she is watching her blood pressures: Range in usually 120/70.  The patients blood pressures remain stable. Saw pcp recently and all values were good and lab test were normal.  The patient will continue with the current regimen.  . Assessed patient's education and care coordination needs:  The patient denies any educational needs at this time. The patient is feeling great and happy about the  change in the weather so she can get out and enjoy being outside.  The patient states she just needs her albuterol inhaler refilled. Looked in the chart and it looks like the albuterol has been refilled. Provided information to the patient. The patient is going to call [facility] and inquire about her prescription.  . Provided disease specific education to patient.  Review of Heart Healthy diet.  The patient is watching her diet and following recommendations of provider. . Assessed appointments.  The patient sees the pcp 06-09-2020.  The patient has CCM contact information for concerns or needs.  Will continue to monitor for changes.  . Assessed activity level. The patient is able to do more activity since the torn meniscus is healing and she is not having inflammation like before. She is happy for changes in the weather.  Nash Dimmer with appropriate clinical care team members regarding patient needs  Patient Self Care Activities related to HTN and HLD:  . Patient is unable to independently self-manage chronic health conditions  Please see past updates related to this goal by clicking on the "Past Updates" button in the selected goal         Patient verbalizes understanding of instructions provided today.   The care management team will reach out to the patient again over the next 60 days.   [name] RN, MSN, [facility] Mobile: [phone]

## 2020-01-21 ENCOUNTER — Encounter: Payer: Self-pay | Admitting: Family Medicine

## 2020-02-27 ENCOUNTER — Telehealth: Payer: Self-pay

## 2020-03-10 DIAGNOSIS — D2271 Melanocytic nevi of right lower limb, including hip: Secondary | ICD-10-CM | POA: Diagnosis not present

## 2020-03-10 DIAGNOSIS — C44712 Basal cell carcinoma of skin of right lower limb, including hip: Secondary | ICD-10-CM | POA: Diagnosis not present

## 2020-03-10 DIAGNOSIS — D2261 Melanocytic nevi of right upper limb, including shoulder: Secondary | ICD-10-CM | POA: Diagnosis not present

## 2020-03-10 DIAGNOSIS — D2262 Melanocytic nevi of left upper limb, including shoulder: Secondary | ICD-10-CM | POA: Diagnosis not present

## 2020-03-10 DIAGNOSIS — D485 Neoplasm of uncertain behavior of skin: Secondary | ICD-10-CM | POA: Diagnosis not present

## 2020-03-10 DIAGNOSIS — L821 Other seborrheic keratosis: Secondary | ICD-10-CM | POA: Diagnosis not present

## 2020-03-10 DIAGNOSIS — D2272 Melanocytic nevi of left lower limb, including hip: Secondary | ICD-10-CM | POA: Diagnosis not present

## 2020-03-10 DIAGNOSIS — D225 Melanocytic nevi of trunk: Secondary | ICD-10-CM | POA: Diagnosis not present

## 2020-04-03 DIAGNOSIS — C44712 Basal cell carcinoma of skin of right lower limb, including hip: Secondary | ICD-10-CM | POA: Diagnosis not present

## 2020-04-15 ENCOUNTER — Telehealth: Payer: Self-pay | Admitting: General Practice

## 2020-04-15 ENCOUNTER — Ambulatory Visit (INDEPENDENT_AMBULATORY_CARE_PROVIDER_SITE_OTHER): Payer: PPO | Admitting: General Practice

## 2020-04-15 DIAGNOSIS — I10 Essential (primary) hypertension: Secondary | ICD-10-CM

## 2020-04-15 DIAGNOSIS — E7800 Pure hypercholesterolemia, unspecified: Secondary | ICD-10-CM

## 2020-04-15 NOTE — Chronic Care Management (AMB) (Signed)
Chronic Care Management   Follow Up Note   04/15/2020 Name: Heather James MRN: [id number] DOB: 1950/08/19  Referred by: [name], PA-C Reason for referral : Chronic Care Management (RNCM Follow up Call for chronic Disease Management and Care Coordination Needs)   Heather James is a 69 y.o. year old female who is a primary care patient of [name], [place]. The CCM team was consulted for assistance with chronic disease management and care coordination needs.    Review of patient status, including review of consultants reports, relevant laboratory and other test results, and collaboration with appropriate care team members and the patient's provider was performed as part of comprehensive patient evaluation and provision of chronic care management services.    SDOH (Social Determinants of Health) assessments performed: Yes See Care Plan activities for detailed interventions related to [facility])     Outpatient Encounter Medications as of 04/15/2020  Medication Sig  . albuterol (VENTOLIN HFA) 108 (90 Base) MCG/ACT inhaler Inhale 2 puffs into the lungs every 6 (six) hours as needed for wheezing or shortness of breath.  Marland Kitchen aspirin 81 MG tablet Take 81 mg by mouth daily.  Marland Kitchen atorvastatin (LIPITOR) 20 MG tablet Take 1 tablet (20 mg total) by mouth daily.  . diclofenac sodium (VOLTAREN) 1 % GEL Apply 4 g topically 4 (four) times daily.  . hydrochlorothiazide (HYDRODIURIL) 50 MG tablet Take 1 tablet (50 mg total) by mouth every other day.  Marland Kitchen HYDROcodone-acetaminophen (NORCO/VICODIN) 5-325 MG tablet Take 1 tablet by mouth every 4 (four) hours as needed for moderate pain. (Patient not taking: Reported on 08/24/2019)  . mometasone (NASONEX) 50 MCG/ACT nasal spray Place 2 sprays into the nose daily.  . potassium chloride SA (K-DUR,KLOR-CON) 20 MEQ tablet TK 1 T PO QD WF  . raloxifene (EVISTA) 60 MG tablet Take 1 tablet (60 mg total) by mouth daily.   No  facility-administered encounter medications on file as of 04/15/2020.     Objective:   Goals Addressed            This Visit's Progress   . RNCM: PT- "I can't exercise like I want to because of a torn meniscus"       CARE PLAN ENTRY (see longtitudinal plan of care for additional care plan information)  Current Barriers:  . Chronic Disease Management support, education, and care coordination needs related to HTN and HLD  Clinical Goal(s) related to HTN and HLD:  Over the next 120 days, patient will:  . Work with the care management team to address educational, disease management, and care coordination needs  . Begin or continue self health monitoring activities as directed today Measure and record blood pressure 3 times per week and continue a heart healthy diet . Call provider office for new or worsened signs and symptoms Blood pressure findings outside established parameters and New or worsened symptom related to HTN and HLD . Call care management team with questions or concerns . Verbalize basic understanding of patient centered plan of care established today  Interventions related to HTN and HLD:  . Evaluation of current treatment plans and patient's adherence to plan as established by provider.  The patient verbalized she is doing great. She is not having to have surgery due to the torn meniscus. It seems to be healing and doing much better. She is happy that she is not going to have to have surgery. This has allowed her to be more active. 04-15-2020: The patient is doing  well and has no new medical concerns.  . Assessed patient understanding of disease states.  The patient has a good understanding of her chronic diseases. States that she is watching her blood pressures: Range in usually 120/70.  The patients blood pressures remain stable. Saw pcp recently and all values were good and lab test were normal.  The patient will continue with the current regimen.  . Assessed patient's  education and care coordination needs:  The patient denies any educational needs at this time. The patient is feeling great and happy about the change in the weather so she can get out and enjoy being outside.  04-15-2020: the patient denies any new concerns at this time. Denies SDOH needs.   . Provided disease specific education to patient.  Review of Heart Healthy diet.  The patient is watching her diet and following recommendations of provider.  Review of vaccine information. The patient had her flu vaccine at work recently. Record updated.  . Assessed appointments.  The patient sees the pcp 06-30-2020.  The patient has CCM contact information for concerns or needs.  Will continue to monitor for changes.  . Assessed activity level. The patient is able to do more activity since the torn meniscus is healing and she is not having inflammation like before. She is happy for changes in the weather.  Nash Dimmer with appropriate clinical care team members regarding patient needs.  The patient denies any needs from the LCSW or pharmacist. Knows they are available if needed.   Patient Self Care Activities related to HTN and HLD:  . Patient is unable to independently self-manage chronic health conditions  Please see past updates related to this goal by clicking on the "Past Updates" button in the selected goal          Plan:   Telephone follow up appointment with care management team member scheduled for: 06-18-2020 at 1 pm   [name] RN, MSN, [facility] Mobile: [phone]

## 2020-04-15 NOTE — Patient Instructions (Signed)
Visit Information  Goals Addressed            This Visit's Progress   . RNCM: PT- "I can't exercise like I want to because of a torn meniscus"       CARE PLAN ENTRY (see longtitudinal plan of care for additional care plan information)  Current Barriers:  . Chronic Disease Management support, education, and care coordination needs related to HTN and HLD  Clinical Goal(s) related to HTN and HLD:  Over the next 120 days, patient will:  . Work with the care management team to address educational, disease management, and care coordination needs  . Begin or continue self health monitoring activities as directed today Measure and record blood pressure 3 times per week and continue a heart healthy diet . Call provider office for new or worsened signs and symptoms Blood pressure findings outside established parameters and New or worsened symptom related to HTN and HLD . Call care management team with questions or concerns . Verbalize basic understanding of patient centered plan of care established today  Interventions related to HTN and HLD:  . Evaluation of current treatment plans and patient's adherence to plan as established by provider.  The patient verbalized she is doing great. She is not having to have surgery due to the torn meniscus. It seems to be healing and doing much better. She is happy that she is not going to have to have surgery. This has allowed her to be more active. 04-15-2020: The patient is doing well and has no new medical concerns.  . Assessed patient understanding of disease states.  The patient has a good understanding of her chronic diseases. States that she is watching her blood pressures: Range in usually 120/70.  The patients blood pressures remain stable. Saw pcp recently and all values were good and lab test were normal.  The patient will continue with the current regimen.  . Assessed patient's education and care coordination needs:  The patient denies any educational  needs at this time. The patient is feeling great and happy about the change in the weather so she can get out and enjoy being outside.  04-15-2020: the patient denies any new concerns at this time. Denies SDOH needs.   . Provided disease specific education to patient.  Review of Heart Healthy diet.  The patient is watching her diet and following recommendations of provider.  Review of vaccine information. The patient had her flu vaccine at work recently. Record updated.  . Assessed appointments.  The patient sees the pcp 06-30-2020.  The patient has CCM contact information for concerns or needs.  Will continue to monitor for changes.  . Assessed activity level. The patient is able to do more activity since the torn meniscus is healing and she is not having inflammation like before. She is happy for changes in the weather.  Nash Dimmer with appropriate clinical care team members regarding patient needs.  The patient denies any needs from the LCSW or pharmacist. Knows they are available if needed.   Patient Self Care Activities related to HTN and HLD:  . Patient is unable to independently self-manage chronic health conditions  Please see past updates related to this goal by clicking on the "Past Updates" button in the selected goal         Patient verbalizes understanding of instructions provided today.   Telephone follow up appointment with care management team member scheduled for: 06-18-2020 at 1 pm  [place], MSN, [place]  [facility] Mobile: [phone]

## 2020-05-27 ENCOUNTER — Other Ambulatory Visit: Payer: Self-pay | Admitting: Family Medicine

## 2020-05-27 DIAGNOSIS — Z1231 Encounter for screening mammogram for malignant neoplasm of breast: Secondary | ICD-10-CM

## 2020-06-06 ENCOUNTER — Other Ambulatory Visit: Payer: Self-pay | Admitting: Family Medicine

## 2020-06-09 ENCOUNTER — Encounter: Payer: PPO | Admitting: Family Medicine

## 2020-06-09 ENCOUNTER — Ambulatory Visit (INDEPENDENT_AMBULATORY_CARE_PROVIDER_SITE_OTHER): Payer: PPO

## 2020-06-09 VITALS — Ht 62.0 in | Wt 234.0 lb

## 2020-06-09 DIAGNOSIS — Z0000 Encounter for general adult medical examination without abnormal findings: Secondary | ICD-10-CM | POA: Diagnosis not present

## 2020-06-09 NOTE — Patient Instructions (Signed)
Heather James , Thank you for taking time to come for your Medicare Wellness Visit. I appreciate your ongoing commitment to your health goals. Please review the following plan we discussed and let me know if I can assist you in the future.   Screening recommendations/referrals: Colonoscopy: completed 03/14/2018, due 03/15/2023 Mammogram: scheduled for 07/11/2020 Bone Density: completed 07/04/2017, due 07/04/2020 Recommended yearly ophthalmology/optometry visit for glaucoma screening and checkup Recommended yearly dental visit for hygiene and checkup  Vaccinations: Influenza vaccine: completed 03/28/2020, due 01/26/2021 Pneumococcal vaccine: completed 05/24/2016 Tdap vaccine: completed 04/03/2013, due 04/04/2023 Shingles vaccine: completed   Covid-19: 08/03/2019, 08/24/2019, 06/02/2020  Advanced directives: Advance directive discussed with you today. Even though you declined this today please call our office should you change your mind and we can give you the proper paperwork for you to fill out.   Conditions/risks identified: none  Next appointment: Follow up in one year for your annual wellness visit    Preventive Care 65 Years and Older, Female Preventive care refers to lifestyle choices and visits with your health care provider that can promote health and wellness. What does preventive care include?  A yearly physical exam. This is also called an annual well check.  Dental exams once or twice a year.  Routine eye exams. Ask your health care provider how often you should have your eyes checked.  Personal lifestyle choices, including:  Daily care of your teeth and gums.  Regular physical activity.  Eating a healthy diet.  Avoiding tobacco and drug use.  Limiting alcohol use.  Practicing safe sex.  Taking low-dose aspirin every day.  Taking vitamin and mineral supplements as recommended by your health care provider. What happens during an annual well check? The services and screenings  done by your health care provider during your annual well check will depend on your age, overall health, lifestyle risk factors, and family history of disease. Counseling  Your health care provider may ask you questions about your:  Alcohol use.  Tobacco use.  Drug use.  Emotional well-being.  Home and relationship well-being.  Sexual activity.  Eating habits.  History of falls.  Memory and ability to understand (cognition).  Work and work Statistician.  Reproductive health. Screening  You may have the following tests or measurements:  Height, weight, and BMI.  Blood pressure.  Lipid and cholesterol levels. These may be checked every 5 years, or more frequently if you are over 60 years old.  Skin check.  Lung cancer screening. You may have this screening every year starting at age 75 if you have a 30-pack-year history of smoking and currently smoke or have quit within the past 15 years.  Fecal occult blood test (FOBT) of the stool. You may have this test every year starting at age 23.  Flexible sigmoidoscopy or colonoscopy. You may have a sigmoidoscopy every 5 years or a colonoscopy every 10 years starting at age 83.  Hepatitis C blood test.  Hepatitis B blood test.  Sexually transmitted disease (STD) testing.  Diabetes screening. This is done by checking your blood sugar (glucose) after you have not eaten for a while (fasting). You may have this done every 1-3 years.  Bone density scan. This is done to screen for osteoporosis. You may have this done starting at age 63.  Mammogram. This may be done every 1-2 years. Talk to your health care provider about how often you should have regular mammograms. Talk with your health care provider about your test results, treatment options,  and if necessary, the need for more tests. Vaccines  Your health care provider may recommend certain vaccines, such as:  Influenza vaccine. This is recommended every year.  Tetanus,  diphtheria, and acellular pertussis (Tdap, Td) vaccine. You may need a Td booster every 10 years.  Zoster vaccine. You may need this after age 82.  Pneumococcal 13-valent conjugate (PCV13) vaccine. One dose is recommended after age 87.  Pneumococcal polysaccharide (PPSV23) vaccine. One dose is recommended after age 50. Talk to your health care provider about which screenings and vaccines you need and how often you need them. This information is not intended to replace advice given to you by your health care provider. Make sure you discuss any questions you have with your health care provider. Document Released: 07/11/2015 Document Revised: 03/03/2016 Document Reviewed: 04/15/2015 Elsevier Interactive Patient Education  2017 [place] Prevention in the Home Falls can cause injuries. They can happen to people of all ages. There are many things you can do to make your home safe and to help prevent falls. What can I do on the outside of my home?  Regularly fix the edges of walkways and driveways and fix any cracks.  Remove anything that might make you trip as you walk through a door, such as a raised step or threshold.  Trim any bushes or trees on the path to your home.  Use bright outdoor lighting.  Clear any walking paths of anything that might make someone trip, such as rocks or tools.  Regularly check to see if handrails are loose or broken. Make sure that both sides of any steps have handrails.  Any raised decks and porches should have guardrails on the edges.  Have any leaves, snow, or ice cleared regularly.  Use sand or salt on walking paths during winter.  Clean up any spills in your garage right away. This includes oil or grease spills. What can I do in the bathroom?  Use night lights.  Install grab bars by the toilet and in the tub and shower. Do not use towel bars as grab bars.  Use non-skid mats or decals in the tub or shower.  If you need to sit down in  the shower, use a plastic, non-slip stool.  Keep the floor dry. Clean up any water that spills on the floor as soon as it happens.  Remove soap buildup in the tub or shower regularly.  Attach bath mats securely with double-sided non-slip rug tape.  Do not have throw rugs and other things on the floor that can make you trip. What can I do in the bedroom?  Use night lights.  Make sure that you have a light by your bed that is easy to reach.  Do not use any sheets or blankets that are too big for your bed. They should not hang down onto the floor.  Have a firm chair that has side arms. You can use this for support while you get dressed.  Do not have throw rugs and other things on the floor that can make you trip. What can I do in the kitchen?  Clean up any spills right away.  Avoid walking on wet floors.  Keep items that you use a lot in easy-to-reach places.  If you need to reach something above you, use a strong step stool that has a grab bar.  Keep electrical cords out of the way.  Do not use floor polish or wax that makes floors slippery. If  you must use wax, use non-skid floor wax.  Do not have throw rugs and other things on the floor that can make you trip. What can I do with my stairs?  Do not leave any items on the stairs.  Make sure that there are handrails on both sides of the stairs and use them. Fix handrails that are broken or loose. Make sure that handrails are as long as the stairways.  Check any carpeting to make sure that it is firmly attached to the stairs. Fix any carpet that is loose or worn.  Avoid having throw rugs at the top or bottom of the stairs. If you do have throw rugs, attach them to the floor with carpet tape.  Make sure that you have a light switch at the top of the stairs and the bottom of the stairs. If you do not have them, ask someone to add them for you. What else can I do to help prevent falls?  Wear shoes that:  Do not have high  heels.  Have rubber bottoms.  Are comfortable and fit you well.  Are closed at the toe. Do not wear sandals.  If you use a stepladder:  Make sure that it is fully opened. Do not climb a closed stepladder.  Make sure that both sides of the stepladder are locked into place.  Ask someone to hold it for you, if possible.  Clearly mark and make sure that you can see:  Any grab bars or handrails.  First and last steps.  Where the edge of each step is.  Use tools that help you move around (mobility aids) if they are needed. These include:  Canes.  Walkers.  Scooters.  Crutches.  Turn on the lights when you go into a dark area. Replace any light bulbs as soon as they burn out.  Set up your furniture so you have a clear path. Avoid moving your furniture around.  If any of your floors are uneven, fix them.  If there are any pets around you, be aware of where they are.  Review your medicines with your doctor. Some medicines can make you feel dizzy. This can increase your chance of falling. Ask your doctor what other things that you can do to help prevent falls. This information is not intended to replace advice given to you by your health care provider. Make sure you discuss any questions you have with your health care provider. Document Released: 04/10/2009 Document Revised: 11/20/2015 Document Reviewed: 07/19/2014 Elsevier Interactive Patient Education  2017 [employer].

## 2020-06-09 NOTE — Progress Notes (Signed)
I connected with Heather James today by telephone and verified that I am speaking with the correct person using two identifiers. Location patient: home Location provider: work Persons participating in the virtual visit: Maren, Wiesen LPN.   I discussed the limitations, risks, security and privacy concerns of performing an evaluation and management service by telephone and the availability of in person appointments. I also discussed with the patient that there may be a patient responsible charge related to this service. The patient expressed understanding and verbally consented to this telephonic visit.    Interactive audio and video telecommunications were attempted between this provider and patient, however failed, due to patient having technical difficulties OR patient did not have access to video capability.  We continued and completed visit with audio only.     Vital signs may be patient reported or missing.  Subjective:   Heather James is a 69 y.o. female who presents for Medicare Annual (Subsequent) preventive examination.  Review of Systems     Cardiac Risk Factors include: advanced age (>11men, >65 women);dyslipidemia;hypertension;obesity (BMI >30kg/m2);sedentary lifestyle     Objective:    Today's Vitals   06/09/20 0811  Weight: 234 lb (106.1 kg)  Height: 5\' 2"  (1.575 m)   Body mass index is 42.8 kg/m.  Advanced Directives 06/09/2020 02/05/2019 06/01/2018 03/14/2018 05/27/2017 05/24/2016 05/23/2015  Does Patient Have a Medical Advance Directive? No No No Yes Yes Yes No  Type of Advance Directive - - - Clinical cytogeneticist of [employer] Power of [place];Living will -  Copy of [place] in Chart? - - - No - copy requested No - copy requested - -  Would patient like information on creating a medical advance directive? - No - Patient declined Yes (MAU/Ambulatory/Procedural Areas - Information given) - - - No -  patient declined information    Current Medications (verified) Outpatient Encounter Medications as of 06/09/2020  Medication Sig  . albuterol (VENTOLIN HFA) 108 (90 Base) MCG/ACT inhaler Inhale 2 puffs into the lungs every 6 (six) hours as needed for wheezing or shortness of breath.  Marland Kitchen aspirin 81 MG tablet Take 81 mg by mouth daily.  Marland Kitchen atorvastatin (LIPITOR) 20 MG tablet TAKE 1 TABLET(20 MG) BY MOUTH DAILY  . diclofenac sodium (VOLTAREN) 1 % GEL Apply 4 g topically 4 (four) times daily.  . hydrochlorothiazide (HYDRODIURIL) 50 MG tablet Take 1 tablet (50 mg total) by mouth every other day.  . mometasone (NASONEX) 50 MCG/ACT nasal spray Place 2 sprays into the nose daily.  . potassium chloride SA (K-DUR,KLOR-CON) 20 MEQ tablet TK 1 T PO QD WF  . raloxifene (EVISTA) 60 MG tablet Take 1 tablet (60 mg total) by mouth daily.  Marland Kitchen HYDROcodone-acetaminophen (NORCO/VICODIN) 5-325 MG tablet Take 1 tablet by mouth every 4 (four) hours as needed for moderate pain. (Patient not taking: No sig reported)   No facility-administered encounter medications on file as of 06/09/2020.    Allergies (verified) Patient has no known allergies.   History: Past Medical History:  Diagnosis Date  . Cancer ([place])    skin ca on leg  . CSF leak from ear   . Hypercholesteremia   . Hypertension   . Obesity   . Osteopenia    Past Surgical History:  Procedure Laterality Date  . BREAST BIOPSY Left 11/21/96   neg  . BREAST SURGERY Left    cyst removed  . COLONOSCOPY WITH PROPOFOL N/A 03/14/2018   Procedure: COLONOSCOPY  WITH PROPOFOL;  Surgeon: Toledo, Benay Pike, MD;  Location: [facility] ENDOSCOPY;  Service: Gastroenterology;  Laterality: N/A;  . FACIAL NERVE SURGERY    . PILONIDAL CYST EXCISION    . SKIN CANCER EXCISION Right    right leg, April 2017   Family History  Problem Relation Age of Onset  . Heart disease Mother   . Diabetes Mother   . Hypertension Mother   . Breast cancer Mother 48  . Cancer Mother   .  Heart disease Father   . Diabetes Father   . Hypertension Brother   . Cancer Maternal Grandmother        spine  . Heart disease Maternal Grandfather   . Hearing loss Maternal Grandfather   . Heart disease Paternal Grandmother   . Emphysema Paternal Grandfather    Social History   Socioeconomic History  . Marital status: Divorced    Spouse name: Not on file  . Number of children: Not on file  . Years of education: 102  . Highest education level: 12th grade  Occupational History  . Occupation: retired  Tobacco Use  . Smoking status: Never Smoker  . Smokeless tobacco: Never Used  Vaping Use  . Vaping Use: Never used  Substance and Sexual Activity  . Alcohol use: No    Alcohol/week: 0.0 standard drinks  . Drug use: No  . Sexual activity: Not Currently    Birth control/protection: None  Other Topics Concern  . Not on file  Social History Narrative  . Not on file   Social Determinants of Health   Financial Resource Strain: Low Risk   . Difficulty of Paying Living Expenses: Not hard at all  Food Insecurity: No Food Insecurity  . Worried About Charity fundraiser in the Last Year: Never true  . Ran Out of Food in the Last Year: Never true  Transportation Needs: No Transportation Needs  . Lack of Transportation (Medical): No  . Lack of Transportation (Non-Medical): No  Physical Activity: Inactive  . Days of Exercise per Week: 0 days  . Minutes of Exercise per Session: 0 min  Stress: No Stress Concern Present  . Feeling of Stress : Not at all  Social Connections: Moderately Integrated  . Frequency of Communication with Friends and Family: More than three times a week  . Frequency of Social Gatherings with Friends and Family: Twice a week  . Attends Religious Services: More than 4 times per year  . Active Member of Clubs or Organizations: No  . Attends Archivist Meetings: Never  . Marital Status: Married    Tobacco Counseling Counseling given: Not  Answered   Clinical Intake:  Pre-visit preparation completed: Yes  Pain : No/denies pain     Nutritional Status: BMI > 30  Obese Nutritional Risks: None Diabetes: No  How often do you need to have someone help you when you read instructions, pamphlets, or other written materials from your doctor or pharmacy?: 1 - Never What is the last grade level you completed in school?: 12th grade  Diabetic? no  Interpreter Needed?: No  Information entered by :: NAllen LPN   Activities of Daily Living In your present state of health, do you have any difficulty performing the following activities: 06/09/2020  Hearing? Y  Vision? N  Difficulty concentrating or making decisions? N  Walking or climbing stairs? N  Dressing or bathing? N  Doing errands, shopping? N  Preparing Food and eating ? N  Using the Toilet?  N  In the past six months, have you accidently leaked urine? N  Do you have problems with loss of bowel control? N  Managing your Medications? N  Managing your Finances? N  Housekeeping or managing your Housekeeping? N  Some recent data might be hidden    Patient Care Team: [name], PA-C as PCP - General (Family Medicine) Elsie Saas, MD as Consulting Physician (Orthopedic Surgery) Vanita Ingles, RN as Case Manager (General Practice)  Indicate any recent [place] you may have received from other than [facility] providers in the past year (date may be approximate).     Assessment:   This is a routine wellness examination for Sharon.  Hearing/Vision screen No exam data present  Dietary issues and exercise activities discussed: Current Exercise Habits: The patient does not participate in regular exercise at present  Goals    . DIET - INCREASE WATER INTAKE     Recommend drinking at least 5-6 glasses of water a day     . Patient Stated     06/09/2020, no goals    . RNCM: PT- "I can't exercise like I want to because of a torn meniscus"     CARE  PLAN ENTRY (see longtitudinal plan of care for additional care plan information)  Current Barriers:  . Chronic Disease Management support, education, and care coordination needs related to HTN and HLD  Clinical Goal(s) related to HTN and HLD:  Over the next 120 days, patient will:  . Work with the care management team to address educational, disease management, and care coordination needs  . Begin or continue self health monitoring activities as directed today Measure and record blood pressure 3 times per week and continue a heart healthy diet . Call provider office for new or worsened signs and symptoms Blood pressure findings outside established parameters and New or worsened symptom related to HTN and HLD . Call care management team with questions or concerns . Verbalize basic understanding of patient centered plan of care established today  Interventions related to HTN and HLD:  . Evaluation of current treatment plans and patient's adherence to plan as established by provider.  The patient verbalized she is doing great. She is not having to have surgery due to the torn meniscus. It seems to be healing and doing much better. She is happy that she is not going to have to have surgery. This has allowed her to be more active. 04-15-2020: The patient is doing well and has no new medical concerns.  . Assessed patient understanding of disease states.  The patient has a good understanding of her chronic diseases. States that she is watching her blood pressures: Range in usually 120/70.  The patients blood pressures remain stable. Saw pcp recently and all values were good and lab test were normal.  The patient will continue with the current regimen.  . Assessed patient's education and care coordination needs:  The patient denies any educational needs at this time. The patient is feeling great and happy about the change in the weather so she can get out and enjoy being outside.  04-15-2020: the patient  denies any new concerns at this time. Denies SDOH needs.   . Provided disease specific education to patient.  Review of Heart Healthy diet.  The patient is watching her diet and following recommendations of provider.  Review of vaccine information. The patient had her flu vaccine at work recently. Record updated.  . Assessed appointments.  The patient sees  the pcp 06-30-2020.  The patient has CCM contact information for concerns or needs.  Will continue to monitor for changes.  . Assessed activity level. The patient is able to do more activity since the torn meniscus is healing and she is not having inflammation like before. She is happy for changes in the weather.  Nash Dimmer with appropriate clinical care team members regarding patient needs.  The patient denies any needs from the LCSW or pharmacist. Knows they are available if needed.   Patient Self Care Activities related to HTN and HLD:  . Patient is unable to independently self-manage chronic health conditions  Please see past updates related to this goal by clicking on the "Past Updates" button in the selected goal        Depression Screen PHQ 2/9 Scores 06/09/2020 08/24/2019 06/07/2019 06/01/2018 01/23/2018 05/27/2017 05/24/2016  PHQ - 2 Score 0 0 0 0 0 0 0  PHQ- 9 Score - - - - - - 0    Fall Risk Fall Risk  06/09/2020 06/07/2019 12/07/2018 06/01/2018 01/23/2018  Falls in the past year? 0 0 0 0 No  Number falls in past yr: - 0 0 0 -  Injury with Fall? - 0 0 0 -  Risk for fall due to : Medication side effect - - - -  Follow up Falls evaluation completed;Education provided;Falls prevention discussed - Falls evaluation completed - -    FALL RISK PREVENTION PERTAINING TO THE HOME:  Any stairs in or around the home? Yes  If so, are there any without handrails? No  Home free of loose throw rugs in walkways, pet beds, electrical cords, etc? Yes  Adequate lighting in your home to reduce risk of falls? Yes   ASSISTIVE DEVICES UTILIZED TO  PREVENT FALLS:  Life alert? No  Use of a cane, walker or w/c? No  Grab bars in the bathroom? Yes  Shower chair or bench in shower? No  Elevated toilet seat or a handicapped toilet? Yes   TIMED UP AND GO:  Was the test performed? No .    Cognitive Function:     6CIT Screen 06/09/2020 06/07/2019 06/01/2018 05/27/2017  What Year? 0 points 0 points 0 points 0 points  What month? 0 points 0 points 0 points 0 points  What time? 0 points 0 points 0 points 0 points  Count back from 20 0 points 0 points 0 points 0 points  Months in reverse 0 points 0 points 0 points 0 points  Repeat phrase 0 points 0 points 0 points 0 points  Total Score 0 0 0 0    Immunizations Immunization History  Administered Date(s) Administered  . Influenza, High Dose Seasonal PF 04/12/2019  . Influenza-Unspecified 03/29/2016, 04/06/2017, 03/28/2020  . PFIZER SARS-COV-2 Vaccination 08/03/2019, 08/24/2019  . Pneumococcal Conjugate-13 05/23/2015  . Pneumococcal Polysaccharide-23 05/24/2016  . Tdap 04/03/2013  . Zoster 04/03/2013  . Zoster Recombinat (Shingrix) 02/24/2018, 05/17/2018, 08/11/2018    TDAP status: Up to date  Flu Vaccine status: Up to date  Pneumococcal vaccine status: Up to date  Covid-19 vaccine status: Completed vaccines  Qualifies for Shingles Vaccine? Yes   Zostavax completed Yes   Shingrix Completed?: Yes  Screening Tests Health Maintenance  Topic Date Due  . COVID-19 Vaccine (3 - Booster for Pfizer series) 02/21/2020  . DEXA SCAN  07/04/2020  . MAMMOGRAM  07/08/2021  . COLONOSCOPY  03/15/2023  . TETANUS/TDAP  04/04/2023  . INFLUENZA VACCINE  Completed  . Hepatitis C Screening  Completed  . PNA vac Low Risk Adult  Completed    Health Maintenance  Health Maintenance Due  Topic Date Due  . COVID-19 Vaccine (3 - Booster for Pfizer series) 02/21/2020    Colorectal cancer screening: Type of screening: Colonoscopy. Completed 03/14/2018. Repeat every 5 years  Mammogram  status: Completed 07/09/2019. Repeat every year  Bone Density status: Completed 07/04/2017.  Lung Cancer Screening: (Low Dose CT Chest recommended if Age 59-80 years, 30 pack-year currently smoking OR have quit w/in 15years.) does not qualify.   Lung Cancer Screening Referral: no  Additional Screening:  Hepatitis C Screening: does qualify; Completed 12/08/2015  Vision Screening: Recommended annual ophthalmology exams for early detection of glaucoma and other disorders of the eye. Is the patient up to date with their annual eye exam?  Yes  Who is the provider or what is the name of the office in which the patient attends annual eye exams? [facility] If pt is not established with a provider, would they like to be referred to a provider to establish care? No .   Dental Screening: Recommended annual dental exams for proper oral hygiene  Community Resource Referral / Chronic Care Management: CRR required this visit?  No   CCM required this visit?  No      Plan:     I have personally reviewed and noted the following in the patient's chart:   . Medical and social history . Use of alcohol, tobacco or illicit drugs  . Current medications and supplements . Functional ability and status . Nutritional status . Physical activity . Advanced directives . List of other physicians . Hospitalizations, surgeries, and ER visits in previous 12 months . Vitals . Screenings to include cognitive, depression, and falls . Referrals and appointments  In addition, I have reviewed and discussed with patient certain preventive protocols, quality metrics, and best practice recommendations. A written personalized care plan for preventive services as well as general preventive health recommendations were provided to patient.     Kellie Simmering, LPN   [id number]   Nurse Notes:

## 2020-06-12 ENCOUNTER — Encounter: Payer: PPO | Admitting: Family Medicine

## 2020-06-18 ENCOUNTER — Telehealth: Payer: Self-pay | Admitting: General Practice

## 2020-06-18 ENCOUNTER — Ambulatory Visit: Payer: Self-pay | Admitting: General Practice

## 2020-06-18 DIAGNOSIS — E7800 Pure hypercholesterolemia, unspecified: Secondary | ICD-10-CM

## 2020-06-18 DIAGNOSIS — I10 Essential (primary) hypertension: Secondary | ICD-10-CM

## 2020-06-18 NOTE — Patient Instructions (Signed)
Visit Information  Goals Addressed            This Visit's Progress   . RNCM: PT- "I can't exercise like I want to because of a torn meniscus"       CARE PLAN ENTRY (see longtitudinal plan of care for additional care plan information)  Current Barriers:  . Chronic Disease Management support, education, and care coordination needs related to HTN and HLD  Clinical Goal(s) related to HTN and HLD:  Over the next 120 days, patient will:  . Work with the care management team to address educational, disease management, and care coordination needs  . Begin or continue self health monitoring activities as directed today Measure and record blood pressure 3 times per week and continue a heart healthy diet . Call provider office for new or worsened signs and symptoms Blood pressure findings outside established parameters and New or worsened symptom related to HTN and HLD . Call care management team with questions or concerns . Verbalize basic understanding of patient centered plan of care established today  Interventions related to HTN and HLD:  . Evaluation of current treatment plans and patient's adherence to plan as established by provider.  The patient verbalized she is doing great. She is not having to have surgery due to the torn meniscus. It seems to be healing and doing much better. She is happy that she is not going to have to have surgery. This has allowed her to be more active. 06-18-2020: The patient is doing well and has no new medical concerns. States her chronic conditions are well controlled at this time.  . Assessed patient understanding of disease states.  The patient has a good understanding of her chronic diseases. States that she is watching her blood pressures: Range in usually 120/70.  The patients blood pressures remain stable. Saw pcp recently and all values were good and lab test were normal.  The patient will continue with the current regimen.  . Assessed patient's education  and care coordination needs:  The patient denies any educational needs at this time. The patient is feeling great and happy about the change in the weather so she can get out and enjoy being outside.  06-18-2020: the patient denies any new concerns at this time. Denies SDOH needs.   . Provided disease specific education to patient.  Review of Heart Healthy diet.  The patient is watching her diet and following recommendations of provider.  Review of vaccine information. The patient had her flu vaccine at work recently. Record updated.  . Assessed appointments.  The patient sees the pcp 06-30-2020.  The patient has CCM contact information for concerns or needs.  Will continue to monitor for changes.  . Assessed activity level. The patient is able to do more activity since the torn meniscus is healing and she is not having inflammation like before. She is happy for changes in the weather.  Nash Dimmer with appropriate clinical care team members regarding patient needs.  The patient denies any needs from the LCSW or pharmacist. Knows they are available if needed.   Patient Self Care Activities related to HTN and HLD:  . Patient is unable to independently self-manage chronic health conditions  Please see past updates related to this goal by clicking on the "Past Updates" button in the selected goal         The patient verbalized understanding of instructions, educational materials, and care plan provided today and declined offer to receive copy of patient  instructions, educational materials, and care plan.   Telephone follow up appointment with care management team member scheduled for: 08-20-2020 at 3:45 pm  Alto Denver RN, MSN, CCM Community Care Coordinator [place]  [facility] Mobile: [phone]

## 2020-06-18 NOTE — Chronic Care Management (AMB) (Signed)
Chronic Care Management   Follow Up Note   06/18/2020 Name: Heather James MRN: [id number] DOB: 03/17/1951  Referred by: [name], PA-C Reason for referral : Chronic Care Management (RNCM Follow up call for Chronic Disease Management and Care Coordination Needs)   Heather James is a 69 y.o. year old female who is a primary care patient of [name], [place]. The CCM team was consulted for assistance with chronic disease management and care coordination needs.    Review of patient status, including review of consultants reports, relevant laboratory and other test results, and collaboration with appropriate care team members and the patient's provider was performed as part of comprehensive patient evaluation and provision of chronic care management services.    SDOH (Social Determinants of Health) assessments performed: Yes See Care Plan activities for detailed interventions related to [facility])     Outpatient Encounter Medications as of 06/18/2020  Medication Sig   albuterol (VENTOLIN HFA) 108 (90 Base) MCG/ACT inhaler Inhale 2 puffs into the lungs every 6 (six) hours as needed for wheezing or shortness of breath.   aspirin 81 MG tablet Take 81 mg by mouth daily.   atorvastatin (LIPITOR) 20 MG tablet TAKE 1 TABLET(20 MG) BY MOUTH DAILY   diclofenac sodium (VOLTAREN) 1 % GEL Apply 4 g topically 4 (four) times daily.   hydrochlorothiazide (HYDRODIURIL) 50 MG tablet Take 1 tablet (50 mg total) by mouth every other day.   HYDROcodone-acetaminophen (NORCO/VICODIN) 5-325 MG tablet Take 1 tablet by mouth every 4 (four) hours as needed for moderate pain. (Patient not taking: No sig reported)   mometasone (NASONEX) 50 MCG/ACT nasal spray Place 2 sprays into the nose daily.   potassium chloride SA (K-DUR,KLOR-CON) 20 MEQ tablet TK 1 T PO QD WF   raloxifene (EVISTA) 60 MG tablet Take 1 tablet (60 mg total) by mouth daily.   No facility-administered encounter  medications on file as of 06/18/2020.     Objective:   Goals Addressed            This Visit's Progress    RNCM: PT- "I can't exercise like I want to because of a torn meniscus"       CARE PLAN ENTRY (see longtitudinal plan of care for additional care plan information)  Current Barriers:   Chronic Disease Management support, education, and care coordination needs related to HTN and HLD  Clinical Goal(s) related to HTN and HLD:  Over the next 120 days, patient will:   Work with the care management team to address educational, disease management, and care coordination needs   Begin or continue self health monitoring activities as directed today Measure and record blood pressure 3 times per week and continue a heart healthy diet  Call provider office for new or worsened signs and symptoms Blood pressure findings outside established parameters and New or worsened symptom related to HTN and HLD  Call care management team with questions or concerns  Verbalize basic understanding of patient centered plan of care established today  Interventions related to HTN and HLD:   Evaluation of current treatment plans and patient's adherence to plan as established by provider.  The patient verbalized she is doing great. She is not having to have surgery due to the torn meniscus. It seems to be healing and doing much better. She is happy that she is not going to have to have surgery. This has allowed her to be more active. 06-18-2020: The patient is doing well and  has no new medical concerns. States her chronic conditions are well controlled at this time.   Assessed patient understanding of disease states.  The patient has a good understanding of her chronic diseases. States that she is watching her blood pressures: Range in usually 120/70.  The patients blood pressures remain stable. Saw pcp recently and all values were good and lab test were normal.  The patient will continue with the current  regimen.   Assessed patient's education and care coordination needs:  The patient denies any educational needs at this time. The patient is feeling great and happy about the change in the weather so she can get out and enjoy being outside.  06-18-2020: the patient denies any new concerns at this time. Denies SDOH needs.    Provided disease specific education to patient.  Review of Heart Healthy diet.  The patient is watching her diet and following recommendations of provider.  Review of vaccine information. The patient had her flu vaccine at work recently. Record updated.   Assessed appointments.  The patient sees the pcp 06-30-2020.  The patient has CCM contact information for concerns or needs.  Will continue to monitor for changes.   Assessed activity level. The patient is able to do more activity since the torn meniscus is healing and she is not having inflammation like before. She is happy for changes in the weather.   Collaborated with appropriate clinical care team members regarding patient needs.  The patient denies any needs from the LCSW or pharmacist. Knows they are available if needed.   Patient Self Care Activities related to HTN and HLD:   Patient is unable to independently self-manage chronic health conditions  Please see past updates related to this goal by clicking on the "Past Updates" button in the selected goal           Plan:   Telephone follow up appointment with care management team member scheduled for: 08-20-2020 at 0345 pm   Alto Denver RN, MSN, CCM Community Care Coordinator [place]   [facility] Mobile: [phone]

## 2020-06-30 ENCOUNTER — Other Ambulatory Visit: Payer: Self-pay

## 2020-06-30 ENCOUNTER — Ambulatory Visit (INDEPENDENT_AMBULATORY_CARE_PROVIDER_SITE_OTHER): Payer: PPO | Admitting: Family Medicine

## 2020-06-30 ENCOUNTER — Telehealth: Payer: Self-pay

## 2020-06-30 ENCOUNTER — Encounter: Payer: Self-pay | Admitting: Family Medicine

## 2020-06-30 VITALS — BP 132/84 | HR 109 | Temp 99.0°F | Ht 62.0 in | Wt 236.8 lb

## 2020-06-30 DIAGNOSIS — Z0000 Encounter for general adult medical examination without abnormal findings: Secondary | ICD-10-CM

## 2020-06-30 DIAGNOSIS — M8580 Other specified disorders of bone density and structure, unspecified site: Secondary | ICD-10-CM | POA: Diagnosis not present

## 2020-06-30 DIAGNOSIS — I10 Essential (primary) hypertension: Secondary | ICD-10-CM

## 2020-06-30 DIAGNOSIS — E7800 Pure hypercholesterolemia, unspecified: Secondary | ICD-10-CM

## 2020-06-30 LAB — URINALYSIS, ROUTINE W REFLEX MICROSCOPIC
Bilirubin, UA: NEGATIVE
Glucose, UA: NEGATIVE
Ketones, UA: NEGATIVE
Nitrite, UA: NEGATIVE
Protein,UA: NEGATIVE
Specific Gravity, UA: 1.02 (ref 1.005–1.030)
Urobilinogen, Ur: 1 mg/dL (ref 0.2–1.0)
pH, UA: 5.5 (ref 5.0–7.5)

## 2020-06-30 LAB — MICROALBUMIN, URINE WAIVED
Creatinine, Urine Waived: 200 mg/dL (ref 10–300)
Microalb, Ur Waived: 80 mg/L — ABNORMAL HIGH (ref 0–19)
Microalb/Creat Ratio: <30 mg/g (ref <30)

## 2020-06-30 LAB — MICROSCOPIC EXAMINATION: Bacteria, UA: NONE SEEN

## 2020-06-30 MED ORDER — ATORVASTATIN CALCIUM 20 MG PO TABS: ORAL_TABLET | ORAL | 1 refills | Status: DC

## 2020-06-30 MED ORDER — HYDROCHLOROTHIAZIDE 50 MG PO TABS: 50.0000 mg | ORAL_TABLET | ORAL | 1 refills | Status: DC

## 2020-06-30 MED ORDER — RALOXIFENE HCL 60 MG PO TABS: 60.0000 mg | ORAL_TABLET | Freq: Every day | ORAL | 3 refills | Status: DC

## 2020-06-30 NOTE — Telephone Encounter (Signed)
Copied from CRM [id number]. Topic: General - Other >> Jun 30, 2020  9:59 AM Marylen Ponto wrote: Reason for CRM: Pt stated she received a call from [facility] but she was in the [place] and could not talk at the time so she was returning the call. Pt requests call back

## 2020-06-30 NOTE — Assessment & Plan Note (Signed)
Due for recheck on DEXA. Ordered today.  

## 2020-06-30 NOTE — Telephone Encounter (Signed)
Spoke with patient, she will call and schedule her bone density.

## 2020-06-30 NOTE — Assessment & Plan Note (Signed)
Encouraged diet and exercise with goal of losing 1-2lbs per week. Call with any concerns. Continue to monitor.  

## 2020-06-30 NOTE — Assessment & Plan Note (Signed)
Under good control on current regimen. Continue current regimen. Continue to monitor. Call with any concerns. Refills given. Labs drawn today.   

## 2020-06-30 NOTE — Progress Notes (Signed)
BP 132/84 (BP Location: Right Arm)   Pulse (!) 109   Temp 99 F (37.2 C) (Oral)   Ht 5\' 2"  (1.575 m)   Wt 236 lb 12.8 oz (107.4 kg)   LMP  (LMP Unknown)   SpO2 97%   BMI 43.31 kg/m    Subjective:    Patient ID: Heather James, female    DOB: May 07, 1951, 70 y.o.   MRN: [id number]  HPI: Heather James is a 70 y.o. female presenting on 06/30/2020 for comprehensive medical examination. Current medical complaints include:  HYPERTENSION / HYPERLIPIDEMIA Satisfied with current treatment? yes Duration of hypertension: chronic BP monitoring frequency: not checking BP medication side effects: no Past BP meds: HCTZ Duration of hyperlipidemia: chronic Cholesterol medication side effects: no Cholesterol supplements: none Past cholesterol medications: atorvastatin Medication compliance: excellent compliance Aspirin: yes Recent stressors: no Recurrent headaches: no Visual changes: no Palpitations: no Dyspnea: no Chest pain: no Lower extremity edema: no Dizzy/lightheaded: no  Menopausal Symptoms: yes- hot flashes, not taking anything  Depression Screen done today and results listed below:  Depression screen [facility] 2/9 06/30/2020 06/09/2020 08/24/2019 06/07/2019 06/01/2018  Decreased Interest 0 0 0 0 0  Down, Depressed, Hopeless 0 0 0 0 0  PHQ - 2 Score 0 0 0 0 0  Altered sleeping - - - - -  Tired, decreased energy - - - - -  Change in appetite - - - - -  Feeling bad or failure about yourself  - - - - -  Trouble concentrating - - - - -  Moving slowly or fidgety/restless - - - - -  Suicidal thoughts - - - - -  PHQ-9 Score - - - - -    Past Medical History:  Past Medical History:  Diagnosis Date  . Cancer ([place])    skin ca on leg  . CSF leak from ear   . Hypercholesteremia   . Hypertension   . Obesity   . Osteopenia     Surgical History:  Past Surgical History:  Procedure Laterality Date  . BREAST BIOPSY Left 11/21/96   neg  . BREAST SURGERY Left    cyst removed   . COLONOSCOPY WITH PROPOFOL N/A 03/14/2018   Procedure: COLONOSCOPY WITH PROPOFOL;  Surgeon: Toledo, Benay Pike, MD;  Location: [facility] ENDOSCOPY;  Service: Gastroenterology;  Laterality: N/A;  . FACIAL NERVE SURGERY    . PILONIDAL CYST EXCISION    . SKIN CANCER EXCISION Right    right leg, April 2017    Medications:  Current Outpatient Medications on File Prior to Visit  Medication Sig  . albuterol (VENTOLIN HFA) 108 (90 Base) MCG/ACT inhaler Inhale 2 puffs into the lungs every 6 (six) hours as needed for wheezing or shortness of breath.  Marland Kitchen aspirin 81 MG tablet Take 81 mg by mouth daily.  . diclofenac sodium (VOLTAREN) 1 % GEL Apply 4 g topically 4 (four) times daily.  . meloxicam (MOBIC) 7.5 MG tablet Take 7.5 mg by mouth daily as needed.  . mometasone (NASONEX) 50 MCG/ACT nasal spray Place 2 sprays into the nose daily.  . potassium chloride SA (K-DUR,KLOR-CON) 20 MEQ tablet TK 1 T PO QD WF   No current facility-administered medications on file prior to visit.    Allergies:  No Known Allergies  Social History:  Social History   Socioeconomic History  . Marital status: Divorced    Spouse name: Not on file  . Number of children: Not on file  .  Years of education: 78  . Highest education level: 12th grade  Occupational History  . Occupation: retired  Tobacco Use  . Smoking status: Never Smoker  . Smokeless tobacco: Never Used  Vaping Use  . Vaping Use: Never used  Substance and Sexual Activity  . Alcohol use: No    Alcohol/week: 0.0 standard drinks  . Drug use: No  . Sexual activity: Not Currently    Birth control/protection: None  Other Topics Concern  . Not on file  Social History Narrative  . Not on file   Social Determinants of Health   Financial Resource Strain: Low Risk   . Difficulty of Paying Living Expenses: Not hard at all  Food Insecurity: No Food Insecurity  . Worried About Charity fundraiser in the Last Year: Never true  . Ran Out of Food in the Last  Year: Never true  Transportation Needs: No Transportation Needs  . Lack of Transportation (Medical): No  . Lack of Transportation (Non-Medical): No  Physical Activity: Inactive  . Days of Exercise per Week: 0 days  . Minutes of Exercise per Session: 0 min  Stress: No Stress Concern Present  . Feeling of Stress : Not at all  Social Connections: Moderately Integrated  . Frequency of Communication with Friends and Family: More than three times a week  . Frequency of Social Gatherings with Friends and Family: Twice a week  . Attends Religious Services: More than 4 times per year  . Active Member of Clubs or Organizations: No  . Attends Archivist Meetings: Never  . Marital Status: Married  Human resources officer Violence: Not At Risk  . Fear of Current or Ex-Partner: No  . Emotionally Abused: No  . Physically Abused: No  . Sexually Abused: No   Social History   Tobacco Use  Smoking Status Never Smoker  Smokeless Tobacco Never Used   Social History   Substance and Sexual Activity  Alcohol Use No  . Alcohol/week: 0.0 standard drinks    Family History:  Family History  Problem Relation Age of Onset  . Heart disease Mother   . Diabetes Mother   . Hypertension Mother   . Breast cancer Mother 33  . Cancer Mother   . Heart disease Father   . Diabetes Father   . Hypertension Brother   . Cancer Maternal Grandmother        spine  . Heart disease Maternal Grandfather   . Hearing loss Maternal Grandfather   . Heart disease Paternal Grandmother   . Emphysema Paternal Grandfather     Past medical history, surgical history, medications, allergies, family history and social history reviewed with patient today and changes made to appropriate areas of the chart.   Review of Systems  Constitutional: Positive for diaphoresis. Negative for chills, fever, malaise/fatigue and weight loss.  HENT: Negative.   Eyes: Negative.   Respiratory: Negative.   Cardiovascular: Negative.    Gastrointestinal: Negative.   Genitourinary: Negative.   Musculoskeletal: Negative.   Skin: Negative.   Neurological: Negative.   Endo/Heme/Allergies: Negative.   Psychiatric/Behavioral: Negative.     All other ROS negative except what is listed above and in the HPI.      Objective:    BP 132/84 (BP Location: Right Arm)   Pulse (!) 109   Temp 99 F (37.2 C) (Oral)   Ht 5\' 2"  (1.575 m)   Wt 236 lb 12.8 oz (107.4 kg)   LMP  (LMP Unknown)   SpO2  97%   BMI 43.31 kg/m   Wt Readings from Last 3 Encounters:  06/30/20 236 lb 12.8 oz (107.4 kg)  06/09/20 234 lb (106.1 kg)  12/10/19 238 lb (108 kg)    Physical Exam Vitals and nursing note reviewed.  Constitutional:      General: She is not in acute distress.    Appearance: Normal appearance. She is obese. She is not ill-appearing, toxic-appearing or diaphoretic.  HENT:     Head: Normocephalic and atraumatic.     Right Ear: Tympanic membrane, ear canal and external ear normal. There is no impacted cerumen.     Left Ear: Tympanic membrane, ear canal and external ear normal. There is no impacted cerumen.     Nose: Nose normal. No congestion or rhinorrhea.     Mouth/Throat:     Mouth: Mucous membranes are moist.     Pharynx: Oropharynx is clear. No oropharyngeal exudate or posterior oropharyngeal erythema.  Eyes:     General: No scleral icterus.       Right eye: No discharge.        Left eye: No discharge.     Extraocular Movements: Extraocular movements intact.     Conjunctiva/sclera: Conjunctivae normal.     Pupils: Pupils are equal, round, and reactive to light.  Neck:     Vascular: No carotid bruit.  Cardiovascular:     Rate and Rhythm: Normal rate and regular rhythm.     Pulses: Normal pulses.     Heart sounds: No murmur heard. No friction rub. No gallop.   Pulmonary:     Effort: Pulmonary effort is normal. No respiratory distress.     Breath sounds: Normal breath sounds. No stridor. No wheezing, rhonchi or rales.   Chest:     Chest wall: No tenderness.  Abdominal:     General: Abdomen is flat. Bowel sounds are normal. There is no distension.     Palpations: Abdomen is soft. There is no mass.     Tenderness: There is no abdominal tenderness. There is no right CVA tenderness, left CVA tenderness, guarding or rebound.     Hernia: No hernia is present.  Genitourinary:    Comments: Breast and pelvic exams deferred with shared decision making Musculoskeletal:        General: No swelling, tenderness, deformity or signs of injury.     Cervical back: Normal range of motion and neck supple. No rigidity. No muscular tenderness.     Right lower leg: No edema.     Left lower leg: No edema.  Lymphadenopathy:     Cervical: No cervical adenopathy.  Skin:    General: Skin is warm and dry.     Capillary Refill: Capillary refill takes less than 2 seconds.     Coloration: Skin is not jaundiced or pale.     Findings: No bruising, erythema, lesion or rash.  Neurological:     General: No focal deficit present.     Mental Status: She is alert and oriented to person, place, and time. Mental status is at baseline.     Cranial Nerves: No cranial nerve deficit.     Sensory: No sensory deficit.     Motor: No weakness.     Coordination: Coordination normal.     Gait: Gait normal.     Deep Tendon Reflexes: Reflexes normal.  Psychiatric:        Mood and Affect: Mood normal.        Behavior: Behavior normal.  Thought Content: Thought content normal.        Judgment: Judgment normal.     Results for orders placed or performed in visit on 12/10/19  Comprehensive metabolic panel  Result Value Ref Range   Glucose 98 65 - 99 mg/dL   BUN 19 8 - 27 mg/dL   Creatinine, Ser 0.92 0.57 - 1.00 mg/dL   GFR calc non Af Amer 64 >59 mL/min/1.73   GFR calc Af Amer 73 >59 mL/min/1.73   BUN/Creatinine Ratio 21 12 - 28   Sodium 143 134 - 144 mmol/L   Potassium 4.7 3.5 - 5.2 mmol/L   Chloride 103 96 - 106 mmol/L   CO2 25  20 - 29 mmol/L   Calcium 10.1 8.7 - 10.3 mg/dL   Total Protein 6.6 6.0 - 8.5 g/dL   Albumin 4.4 3.8 - 4.8 g/dL   Globulin, Total 2.2 1.5 - 4.5 g/dL   Albumin/Globulin Ratio 2.0 1.2 - 2.2   Bilirubin Total 1.1 0.0 - 1.2 mg/dL   Alkaline Phosphatase 126 (H) 48 - 121 IU/L   AST 30 0 - 40 IU/L   ALT 31 0 - 32 IU/L  Lipid Panel w/o Chol/HDL Ratio  Result Value Ref Range   Cholesterol, Total 131 100 - 199 mg/dL   Triglycerides 67 0 - 149 mg/dL   HDL 56 >39 mg/dL   VLDL Cholesterol Cal 14 5 - 40 mg/dL   LDL Chol Calc (NIH) 61 0 - 99 mg/dL  Microalbumin, Urine Waived  Result Value Ref Range   Microalb, Ur Waived 30 (H) 0 - 19 mg/L   Creatinine, Urine Waived 200 10 - 300 mg/dL   Microalb/Creat Ratio <30 <30 mg/g      Assessment & Plan:   Problem List Items Addressed This Visit      Cardiovascular and Mediastinum   Hypertension    Under good control on current regimen. Continue current regimen. Continue to monitor. Call with any concerns. Refills given. Labs drawn today.        Relevant Medications   hydrochlorothiazide (HYDRODIURIL) 50 MG tablet   atorvastatin (LIPITOR) 20 MG tablet   Other Relevant Orders   CBC with Differential/Platelet   Comprehensive metabolic panel   Urinalysis, Routine w reflex microscopic   TSH   Microalbumin, Urine Waived     Musculoskeletal and Integument   Osteopenia    Due for recheck on DEXA. Ordered today.      Relevant Orders   DG Bone Density     Other   Morbid obesity ([place])    Encouraged diet and exercise with goal of losing 1-2lbs per week. Call with any concerns. Continue to monitor.       Relevant Orders   CBC with Differential/Platelet   Comprehensive metabolic panel   Hypercholesteremia    Under good control on current regimen. Continue current regimen. Continue to monitor. Call with any concerns. Refills given. Labs drawn today.        Relevant Medications   hydrochlorothiazide (HYDRODIURIL) 50 MG tablet   atorvastatin  (LIPITOR) 20 MG tablet   Other Relevant Orders   CBC with Differential/Platelet   Comprehensive metabolic panel   Lipid Panel w/o Chol/HDL Ratio    Other Visit Diagnoses    Routine general medical examination at a health care facility    -  Primary   Vaccines up to date. Screening labs checked today. Colonoscopy up to date. Mammogram and DEXA ordered. Continue to monitor. Call with any concerns.  Follow up plan: Return in about 6 months (around 12/28/2020).   LABORATORY TESTING:  - Pap smear: not applicable  IMMUNIZATIONS:   - Tdap: Tetanus vaccination status reviewed: last tetanus booster within 10 years. - Influenza: Up to date - Pneumovax: Up to date - Prevnar: Up to date - COVID: Up to date  SCREENING: -Mammogram: Ordered today  - Colonoscopy: Up to date  - Bone Density: Ordered today   PATIENT COUNSELING:   Advised to take 1 mg of folate supplement per day if capable of pregnancy.   Sexuality: Discussed sexually transmitted diseases, partner selection, use of condoms, avoidance of unintended pregnancy  and contraceptive alternatives.   Advised to avoid cigarette smoking.  I discussed with the patient that most people either abstain from alcohol or drink within safe limits (<=14/week and <=4 drinks/occasion for males, <=7/weeks and <= 3 drinks/occasion for females) and that the risk for alcohol disorders and other health effects rises proportionally with the number of drinks per week and how often a drinker exceeds daily limits.  Discussed cessation/primary prevention of drug use and availability of treatment for abuse.   Diet: Encouraged to adjust caloric intake to maintain  or achieve ideal body weight, to reduce intake of dietary saturated fat and total fat, to limit sodium intake by avoiding high sodium foods and not adding table salt, and to maintain adequate dietary potassium and calcium preferably from fresh fruits, vegetables, and low-fat dairy products.     stressed the importance of regular exercise  Injury prevention: Discussed safety belts, safety helmets, smoke detector, smoking near bedding or upholstery.   Dental health: Discussed importance of regular tooth brushing, flossing, and dental visits.    NEXT PREVENTATIVE PHYSICAL DUE IN 1 YEAR. Return in about 6 months (around 12/28/2020).

## 2020-06-30 NOTE — Patient Instructions (Addendum)
[place] °Black [place]ash °Evening Primrose Oil °

## 2020-07-01 LAB — CBC WITH DIFFERENTIAL/PLATELET
Basophils Absolute: 0.1 10*3/uL (ref 0.0–0.2)
Basos: 1 %
EOS (ABSOLUTE): 0.1 10*3/uL (ref 0.0–0.4)
Eos: 1 %
Hematocrit: 47.8 % — ABNORMAL HIGH (ref 34.0–46.6)
Hemoglobin: 15.6 g/dL (ref 11.1–15.9)
Immature Grans (Abs): 0 10*3/uL (ref 0.0–0.1)
Immature Granulocytes: 0 %
Lymphocytes Absolute: 1.2 10*3/uL (ref 0.7–3.1)
Lymphs: 17 %
MCH: 29.5 pg (ref 26.6–33.0)
MCHC: 32.6 g/dL (ref 31.5–35.7)
MCV: 91 fL (ref 79–97)
Monocytes Absolute: 0.6 10*3/uL (ref 0.1–0.9)
Monocytes: 9 %
Neutrophils Absolute: 5.1 10*3/uL (ref 1.4–7.0)
Neutrophils: 72 %
Platelets: 356 10*3/uL (ref 150–450)
RBC: 5.28 x10E6/uL (ref 3.77–5.28)
RDW: 12.9 % (ref 11.7–15.4)
WBC: 7 10*3/uL (ref 3.4–10.8)

## 2020-07-01 LAB — COMPREHENSIVE METABOLIC PANEL
ALT: 23 IU/L (ref 0–32)
AST: 25 IU/L (ref 0–40)
Albumin/Globulin Ratio: 1.9 (ref 1.2–2.2)
Albumin: 4.6 g/dL (ref 3.8–4.8)
Alkaline Phosphatase: 136 IU/L — ABNORMAL HIGH (ref 44–121)
BUN/Creatinine Ratio: 15 (ref 12–28)
BUN: 13 mg/dL (ref 8–27)
Bilirubin Total: 1.1 mg/dL (ref 0.0–1.2)
CO2: 26 mmol/L (ref 20–29)
Calcium: 9.9 mg/dL (ref 8.7–10.3)
Chloride: 99 mmol/L (ref 96–106)
Creatinine, Ser: 0.88 mg/dL (ref 0.57–1.00)
GFR calc Af Amer: 77 mL/min/{1.73_m2} (ref >59)
GFR calc non Af Amer: 67 mL/min/{1.73_m2} (ref >59)
Globulin, Total: 2.4 g/dL (ref 1.5–4.5)
Glucose: 106 mg/dL — ABNORMAL HIGH (ref 65–99)
Potassium: 4.2 mmol/L (ref 3.5–5.2)
Sodium: 140 mmol/L (ref 134–144)
Total Protein: 7 g/dL (ref 6.0–8.5)

## 2020-07-01 LAB — LIPID PANEL W/O CHOL/HDL RATIO
Cholesterol, Total: 152 mg/dL (ref 100–199)
HDL: 50 mg/dL (ref >39)
LDL Chol Calc (NIH): 84 mg/dL (ref 0–99)
Triglycerides: 95 mg/dL (ref 0–149)
VLDL Cholesterol Cal: 18 mg/dL (ref 5–40)

## 2020-07-01 LAB — TSH: TSH: 2.2 u[IU]/mL (ref 0.450–4.500)

## 2020-07-11 ENCOUNTER — Other Ambulatory Visit: Payer: Self-pay

## 2020-07-11 ENCOUNTER — Ambulatory Visit
Admission: RE | Admit: 2020-07-11 | Discharge: 2020-07-11 | Disposition: A | Discharge status: Home / Self Care | Payer: PPO | Source: Ambulatory Visit | Attending: Family Medicine | Admitting: Family Medicine

## 2020-07-11 DIAGNOSIS — Z1231 Encounter for screening mammogram for malignant neoplasm of breast: Secondary | ICD-10-CM | POA: Diagnosis not present

## 2020-07-21 ENCOUNTER — Inpatient Hospital Stay: Admission: RE | Admit: 2020-07-21 | Payer: PPO | Source: Ambulatory Visit

## 2020-07-30 ENCOUNTER — Other Ambulatory Visit: Payer: Self-pay

## 2020-07-30 ENCOUNTER — Ambulatory Visit
Admission: RE | Admit: 2020-07-30 | Discharge: 2020-07-30 | Disposition: A | Discharge status: Home / Self Care | Payer: PPO | Source: Ambulatory Visit | Attending: Family Medicine | Admitting: Family Medicine

## 2020-07-30 DIAGNOSIS — Z780 Asymptomatic menopausal state: Secondary | ICD-10-CM | POA: Insufficient documentation

## 2020-07-30 DIAGNOSIS — Z8781 Personal history of (healed) traumatic fracture: Secondary | ICD-10-CM | POA: Diagnosis not present

## 2020-07-30 DIAGNOSIS — Z85828 Personal history of other malignant neoplasm of skin: Secondary | ICD-10-CM | POA: Diagnosis not present

## 2020-07-30 DIAGNOSIS — Z13820 Encounter for screening for osteoporosis: Secondary | ICD-10-CM | POA: Diagnosis not present

## 2020-07-30 DIAGNOSIS — M85851 Other specified disorders of bone density and structure, right thigh: Secondary | ICD-10-CM | POA: Diagnosis not present

## 2020-07-30 DIAGNOSIS — M8580 Other specified disorders of bone density and structure, unspecified site: Secondary | ICD-10-CM | POA: Diagnosis not present

## 2020-07-30 DIAGNOSIS — R29890 Loss of height: Secondary | ICD-10-CM | POA: Diagnosis not present

## 2020-08-04 DIAGNOSIS — R208 Other disturbances of skin sensation: Secondary | ICD-10-CM | POA: Diagnosis not present

## 2020-08-04 DIAGNOSIS — Z1283 Encounter for screening for malignant neoplasm of skin: Secondary | ICD-10-CM | POA: Diagnosis not present

## 2020-08-04 DIAGNOSIS — D2262 Melanocytic nevi of left upper limb, including shoulder: Secondary | ICD-10-CM | POA: Diagnosis not present

## 2020-08-04 DIAGNOSIS — D2271 Melanocytic nevi of right lower limb, including hip: Secondary | ICD-10-CM | POA: Diagnosis not present

## 2020-08-04 DIAGNOSIS — D2272 Melanocytic nevi of left lower limb, including hip: Secondary | ICD-10-CM | POA: Diagnosis not present

## 2020-08-04 DIAGNOSIS — L538 Other specified erythematous conditions: Secondary | ICD-10-CM | POA: Diagnosis not present

## 2020-08-04 DIAGNOSIS — L820 Inflamed seborrheic keratosis: Secondary | ICD-10-CM | POA: Diagnosis not present

## 2020-08-04 DIAGNOSIS — Z85828 Personal history of other malignant neoplasm of skin: Secondary | ICD-10-CM | POA: Diagnosis not present

## 2020-08-04 DIAGNOSIS — D2261 Melanocytic nevi of right upper limb, including shoulder: Secondary | ICD-10-CM | POA: Diagnosis not present

## 2020-08-20 ENCOUNTER — Telehealth: Payer: Self-pay | Admitting: General Practice

## 2020-08-20 ENCOUNTER — Telehealth: Payer: Self-pay

## 2020-08-20 NOTE — Telephone Encounter (Signed)
  Chronic Care Management   Outreach Note  08/20/2020 Name: Heather James MRN: [id number] DOB: Nov 14, 1950  Referred by: [name], DO Reason for referral : Appointment (RNCM: Follow up for Chronic Disease Management and Care Coordination Needs )   An unsuccessful telephone outreach was attempted today. The patient was referred to the case management team for assistance with care management and care coordination. The patient answered but was at work. Ask for a call back on Friday.   Follow Up Plan: Telephone follow up appointment with care management team member scheduled for: 08-22-2020 at 4:15 pm  [place], MSN, [facility] Mobile: [phone]

## 2020-08-22 ENCOUNTER — Telehealth: Payer: Self-pay | Admitting: General Practice

## 2020-08-22 ENCOUNTER — Ambulatory Visit (INDEPENDENT_AMBULATORY_CARE_PROVIDER_SITE_OTHER): Payer: PPO | Admitting: General Practice

## 2020-08-22 DIAGNOSIS — E7800 Pure hypercholesterolemia, unspecified: Secondary | ICD-10-CM

## 2020-08-22 DIAGNOSIS — I10 Essential (primary) hypertension: Secondary | ICD-10-CM | POA: Diagnosis not present

## 2020-08-22 NOTE — Chronic Care Management (AMB) (Signed)
Chronic Care Management   CCM RN Visit Note  08/22/2020 Name: Heather James MRN: [id number] DOB: [birth year]  Subjective: Heather James is a 70 y.o. year old female who is a primary care patient of [name], DO. The care management team was consulted for assistance with disease management and care coordination needs.    Engaged with patient by telephone for follow up visit in response to provider referral for case management and/or care coordination services.   Consent to Services:  The patient was given information about Chronic Care Management services, agreed to services, and gave verbal consent prior to initiation of services.  Please see initial visit note for detailed documentation.   Patient agreed to services and verbal consent obtained.   Assessment: Review of patient past medical history, allergies, medications, health status, including review of consultants reports, laboratory and other test data, was performed as part of comprehensive evaluation and provision of chronic care management services.   SDOH (Social Determinants of Health) assessments and interventions performed:    CCM Care Plan  No Known Allergies  Outpatient Encounter Medications as of 08/22/2020  Medication Sig  . calcium carbonate (OS-CAL - DOSED IN MG OF ELEMENTAL CALCIUM) 1250 (500 Ca) MG tablet Take 1 tablet by mouth.  . calcium-vitamin D (OSCAL WITH D) 500-200 MG-UNIT tablet Take 1 tablet by mouth.  Marland Kitchen albuterol (VENTOLIN HFA) 108 (90 Base) MCG/ACT inhaler Inhale 2 puffs into the lungs every 6 (six) hours as needed for wheezing or shortness of breath.  Marland Kitchen aspirin 81 MG tablet Take 81 mg by mouth daily.  Marland Kitchen atorvastatin (LIPITOR) 20 MG tablet TAKE 1 TABLET(20 MG) BY MOUTH DAILY  . diclofenac sodium (VOLTAREN) 1 % GEL Apply 4 g topically 4 (four) times daily.  . hydrochlorothiazide (HYDRODIURIL) 50 MG tablet Take 1 tablet (50 mg total) by mouth every other day.  . meloxicam (MOBIC) 7.5 MG  tablet Take 7.5 mg by mouth daily as needed.  . mometasone (NASONEX) 50 MCG/ACT nasal spray Place 2 sprays into the nose daily.  . potassium chloride SA (K-DUR,KLOR-CON) 20 MEQ tablet TK 1 T PO QD WF  . raloxifene (EVISTA) 60 MG tablet Take 1 tablet (60 mg total) by mouth daily.   No facility-administered encounter medications on file as of 08/22/2020.    Patient Active Problem List   Diagnosis Date Noted  . BMI 40.0-44.9, adult ([place]) 06/05/2018  . Hypercholesteremia 06/05/2018  . Advanced care planning/counseling discussion 05/30/2017  . Tendonitis of foot 05/24/2016  . Hypertension 05/23/2015  . CSF leak from ear 05/13/2015  . Osteopenia 05/13/2015  . Morbid obesity ([name]) 05/13/2015    Conditions to be addressed/monitored:HTN and HLD  Care Plan : RNCM: HLD  Updates made by Vanita Ingles since 08/22/2020 12:00 AM    Problem: RNCM: HLD Management   Priority: Medium    Long-Range Goal: RNCM: HLD management   Priority: Medium  Note:   Current Barriers:  . Poorly controlled hyperlipidemia, complicated by HTN . Current antihyperlipidemic regimen: Lipitor 20 mg QD . Most recent lipid panel:     Component Value Date/Time   CHOL 152 06/30/2020 0849   TRIG 95 06/30/2020 0849   HDL 50 06/30/2020 0849   CHOLHDL 4.0 06/03/2017 0828   LDLCALC 84 06/30/2020 0849 .   Marland Kitchen ASCVD risk enhancing conditions: age >31, HTN . Lacks social connections . Does not contact provider office for questions/concerns  RN Care Manager Clinical Goal(s):  Marland Kitchen Over the next 120 days,  patient will work with Consulting civil engineer, providers, and care team towards execution of optimized self-health management plan . patient will verbalize understanding of plan for effective management of HLD . patient will work with [facility] and pcp  to address needs related to effective management of HLD  Interventions: . Collaboration with [name], DO regarding development and update of comprehensive plan of care as  evidenced by provider attestation and co-signature . Inter-disciplinary care team collaboration (see longitudinal plan of care) . Medication review performed; medication list updated in electronic medical record.  Bertram Savin care team collaboration (see longitudinal plan of care) . Referred to pharmacy team for assistance with HLD medication management . Evaluation of current treatment plan related to HLD  and patient's adherence to plan as established by provider. . Advised patient to call the office for changes or questions . Discussed plans with patient for ongoing care management follow up and provided patient with direct contact information for care management team   Patient Goals/Self-Care Activities: . Over the next 120 days, patient will:   - call for medicine refill 2 or 3 days before it runs out - call if I am sick and can't take my medicine - keep a list of all the medicines I take; vitamins and herbals too - learn to read medicine labels - use a pillbox to sort medicine - use an alarm clock or phone to remind me to take my medicine - change to whole grain breads, cereal, pasta - drink 6 to 8 glasses of water each day - eat 3 to 5 servings of fruits and vegetables each day - eat 5 or 6 small meals each day - fill half the plate with nonstarchy vegetables - limit fast food meals to no more than 1 per week - manage portion size - prepare main meal at home 3 to 5 days each week - read food labels for fat, fiber, carbohydrates and portion size - be open to making changes - I can manage, know and watch for signs of a heart attack - if I have chest pain, call for help - learn about small changes that will make a big difference - learn my personal risk factors  - barriers to meeting goals identified - change-talk evoked - choices provided - collaboration with team encouraged - decision-making supported - health risks reviewed - problem-solving facilitated -  questions answered - readiness for change evaluated - reassurance provided - self-reflection promoted - self-reliance encouraged  Follow Up Plan: Telephone follow up appointment with care management team member scheduled for:11-21-2020 at 11:45 am     Task: RNCM: HLD   Note:   Care Management Activities:    - barriers to meeting goals identified - change-talk evoked - choices provided - collaboration with team encouraged - decision-making supported - health risks reviewed - problem-solving facilitated - questions answered - readiness for change evaluated - reassurance provided - self-reflection promoted - self-reliance encouraged       Care Plan : RNCM: Hypertension (Adult)  Updates made by Vanita Ingles since 08/22/2020 12:00 AM    Problem: RNCM: Hypertension (Hypertension)   Priority: Medium    Long-Range Goal: RNCM: Hypertension Monitored   Note:   Objective:  . Last practice recorded BP readings:  BP Readings from Last 3 Encounters:  06/30/20 132/84  12/10/19 120/80  06/07/19 124/78 .   Marland Kitchen Most recent eGFR/CrCl: No results found for: EGFR  No components found for: CRCL Current Barriers:  Marland Kitchen Knowledge  Deficits related to basic understanding of hypertension pathophysiology and self care management . Knowledge Deficits related to understanding of medications prescribed for management of hypertension . Limited Social Support . Lacks social connections . Does not contact provider office for questions/concerns Case Manager Clinical Goal(s):  Marland Kitchen Over the next 120 days, patient will verbalize understanding of plan for hypertension management . Over the next 120 days, patient will demonstrate improved adherence to prescribed treatment plan for hypertension as evidenced by taking all medications as prescribed, monitoring and recording blood pressure as directed, adhering to low sodium/DASH diet . Over the next 120 days, patient will demonstrate improved health management  independence as evidenced by checking blood pressure as directed and notifying PCP if SBP>160 or DBP > 90, taking all medications as prescribe, and adhering to a low sodium diet as discussed. . Over the next 120 days, patient will verbalize basic understanding of hypertension disease process and self health management plan as evidenced by compliance with medications, compliance with heart healthy diet and working with the CCM team to optimize health and well being  Interventions:  . Collaboration with [name], DO regarding development and update of comprehensive plan of care as evidenced by provider attestation and co-signature . Inter-disciplinary care team collaboration (see longitudinal plan of care) . Evaluation of current treatment plan related to hypertension self management and patient's adherence to plan as established by provider. . Provided education to patient re: stroke prevention, s/s of heart attack and stroke, DASH diet, complications of uncontrolled blood pressure . Reviewed medications with patient and discussed importance of compliance . Discussed plans with patient for ongoing care management follow up and provided patient with direct contact information for care management team . Advised patient, providing education and rationale, to monitor blood pressure daily and record, calling PCP for findings outside established parameters.  Patient Goals: - blood pressure trends reviewed - depression screen reviewed - home or ambulatory blood pressure monitoring encouraged Self-Care Activities: - Self administers medications as prescribed Attends all scheduled provider appointments Calls provider office for new concerns, questions, or BP outside discussed parameters Checks BP and records as discussed Follows a low sodium diet/DASH diet Follow Up Plan: Telephone follow up appointment with care management team member scheduled for: 11-21-2020 at 11:45 am   Task: RNCM: Identify  and Monitor Blood Pressure Elevation   Note:   Care Management Activities:    - blood pressure trends reviewed - depression screen reviewed - home or ambulatory blood pressure monitoring encouraged         Plan:Telephone follow up appointment with care management team member scheduled for:  11-21-2020 at 11:45 am  [place], MSN, [facility] Mobile: [phone]

## 2020-08-22 NOTE — Patient Instructions (Signed)
Visit Information  PATIENT GOALS: Patient Care Plan: RNCM: HLD    Problem Identified: RNCM: HLD Management   Priority: Medium    Long-Range Goal: RNCM: HLD management   Priority: Medium  Note:   Current Barriers:  . Poorly controlled hyperlipidemia, complicated by HTN . Current antihyperlipidemic regimen: Lipitor 20 mg QD . Most recent lipid panel:     Component Value Date/Time   CHOL 152 06/30/2020 0849   TRIG 95 06/30/2020 0849   HDL 50 06/30/2020 0849   CHOLHDL 4.0 06/03/2017 0828   LDLCALC 84 06/30/2020 0849 .   Marland Kitchen ASCVD risk enhancing conditions: age >29, HTN . Lacks social connections . Does not contact provider office for questions/concerns  RN Care Manager Clinical Goal(s):  Marland Kitchen Over the next 120 days, patient will work with Consulting civil engineer, providers, and care team towards execution of optimized self-health management plan . patient will verbalize understanding of plan for effective management of HLD . patient will work with [facility] and pcp  to address needs related to effective management of HLD  Interventions: . Collaboration with [name], DO regarding development and update of comprehensive plan of care as evidenced by provider attestation and co-signature . Inter-disciplinary care team collaboration (see longitudinal plan of care) . Medication review performed; medication list updated in electronic medical record.  Bertram Savin care team collaboration (see longitudinal plan of care) . Referred to pharmacy team for assistance with HLD medication management . Evaluation of current treatment plan related to HLD  and patient's adherence to plan as established by provider. . Advised patient to call the office for changes or questions . Discussed plans with patient for ongoing care management follow up and provided patient with direct contact information for care management team   Patient Goals/Self-Care Activities: . Over the next 120 days, patient will:    - call for medicine refill 2 or 3 days before it runs out - call if I am sick and can't take my medicine - keep a list of all the medicines I take; vitamins and herbals too - learn to read medicine labels - use a pillbox to sort medicine - use an alarm clock or phone to remind me to take my medicine - change to whole grain breads, cereal, pasta - drink 6 to 8 glasses of water each day - eat 3 to 5 servings of fruits and vegetables each day - eat 5 or 6 small meals each day - fill half the plate with nonstarchy vegetables - limit fast food meals to no more than 1 per week - manage portion size - prepare main meal at home 3 to 5 days each week - read food labels for fat, fiber, carbohydrates and portion size - be open to making changes - I can manage, know and watch for signs of a heart attack - if I have chest pain, call for help - learn about small changes that will make a big difference - learn my personal risk factors  - barriers to meeting goals identified - change-talk evoked - choices provided - collaboration with team encouraged - decision-making supported - health risks reviewed - problem-solving facilitated - questions answered - readiness for change evaluated - reassurance provided - self-reflection promoted - self-reliance encouraged  Follow Up Plan: Telephone follow up appointment with care management team member scheduled for:11-21-2020 at 11:45 am     Task: RNCM: HLD   Note:   Care Management Activities:    - barriers to meeting goals  identified - change-talk evoked - choices provided - collaboration with team encouraged - decision-making supported - health risks reviewed - problem-solving facilitated - questions answered - readiness for change evaluated - reassurance provided - self-reflection promoted - self-reliance encouraged       Patient Care Plan: RNCM: Hypertension (Adult)    Problem Identified: RNCM: Hypertension (Hypertension)    Priority: Medium    Long-Range Goal: RNCM: Hypertension Monitored   Note:   Objective:  . Last practice recorded BP readings:  BP Readings from Last 3 Encounters:  06/30/20 132/84  12/10/19 120/80  06/07/19 124/78 .   Marland Kitchen Most recent eGFR/CrCl: No results found for: EGFR  No components found for: CRCL Current Barriers:  Marland Kitchen Knowledge Deficits related to basic understanding of hypertension pathophysiology and self care management . Knowledge Deficits related to understanding of medications prescribed for management of hypertension . Limited Social Support . Lacks social connections . Does not contact provider office for questions/concerns Case Manager Clinical Goal(s):  Marland Kitchen Over the next 120 days, patient will verbalize understanding of plan for hypertension management . Over the next 120 days, patient will demonstrate improved adherence to prescribed treatment plan for hypertension as evidenced by taking all medications as prescribed, monitoring and recording blood pressure as directed, adhering to low sodium/DASH diet . Over the next 120 days, patient will demonstrate improved health management independence as evidenced by checking blood pressure as directed and notifying PCP if SBP>160 or DBP > 90, taking all medications as prescribe, and adhering to a low sodium diet as discussed. . Over the next 120 days, patient will verbalize basic understanding of hypertension disease process and self health management plan as evidenced by compliance with medications, compliance with heart healthy diet and working with the CCM team to optimize health and well being  Interventions:  . Collaboration with [name], DO regarding development and update of comprehensive plan of care as evidenced by provider attestation and co-signature . Inter-disciplinary care team collaboration (see longitudinal plan of care) . Evaluation of current treatment plan related to hypertension self management and patient's  adherence to plan as established by provider. . Provided education to patient re: stroke prevention, s/s of heart attack and stroke, DASH diet, complications of uncontrolled blood pressure . Reviewed medications with patient and discussed importance of compliance . Discussed plans with patient for ongoing care management follow up and provided patient with direct contact information for care management team . Advised patient, providing education and rationale, to monitor blood pressure daily and record, calling PCP for findings outside established parameters.  Patient Goals: - blood pressure trends reviewed - depression screen reviewed - home or ambulatory blood pressure monitoring encouraged Self-Care Activities: - Self administers medications as prescribed Attends all scheduled provider appointments Calls provider office for new concerns, questions, or BP outside discussed parameters Checks BP and records as discussed Follows a low sodium diet/DASH diet Follow Up Plan: Telephone follow up appointment with care management team member scheduled for: 11-21-2020 at 11:45 am   Task: RNCM: Identify and Monitor Blood Pressure Elevation   Note:   Care Management Activities:    - blood pressure trends reviewed - depression screen reviewed - home or ambulatory blood pressure monitoring encouraged         Patient verbalizes understanding of instructions provided today and agrees to view in [place].   Telephone follow up appointment with care management team member scheduled for: 11-21-2020 at 11:45 am  [place], MSN, Meta Coordinator  [facility] Mobile: [phone]

## 2020-09-01 DIAGNOSIS — G510 Bell's palsy: Secondary | ICD-10-CM | POA: Diagnosis not present

## 2020-09-04 ENCOUNTER — Other Ambulatory Visit: Payer: Self-pay | Admitting: Family Medicine

## 2020-10-06 DIAGNOSIS — M1812 Unilateral primary osteoarthritis of first carpometacarpal joint, left hand: Secondary | ICD-10-CM | POA: Diagnosis not present

## 2020-10-06 DIAGNOSIS — M25542 Pain in joints of left hand: Secondary | ICD-10-CM | POA: Diagnosis not present

## 2020-11-07 ENCOUNTER — Encounter: Payer: Self-pay | Admitting: Family Medicine

## 2020-11-21 ENCOUNTER — Telehealth: Payer: Self-pay | Admitting: General Practice

## 2020-11-21 ENCOUNTER — Ambulatory Visit (INDEPENDENT_AMBULATORY_CARE_PROVIDER_SITE_OTHER): Payer: PPO | Admitting: General Practice

## 2020-11-21 DIAGNOSIS — M19042 Primary osteoarthritis, left hand: Secondary | ICD-10-CM | POA: Diagnosis not present

## 2020-11-21 DIAGNOSIS — I10 Essential (primary) hypertension: Secondary | ICD-10-CM

## 2020-11-21 DIAGNOSIS — M19041 Primary osteoarthritis, right hand: Secondary | ICD-10-CM

## 2020-11-21 DIAGNOSIS — E7800 Pure hypercholesterolemia, unspecified: Secondary | ICD-10-CM | POA: Diagnosis not present

## 2020-11-21 NOTE — Patient Instructions (Signed)
Visit Information  PATIENT GOALS: Patient Care Plan: RNCM: HLD    Problem Identified: RNCM: HLD Management   Priority: Medium    Long-Range Goal: RNCM: HLD management   Priority: Medium  Note:   Current Barriers:  . Poorly controlled hyperlipidemia, complicated by HTN . Current antihyperlipidemic regimen: Lipitor 20 mg QD . Most recent lipid panel:     Component Value Date/Time   CHOL 152 06/30/2020 0849   TRIG 95 06/30/2020 0849   HDL 50 06/30/2020 0849   CHOLHDL 4.0 06/03/2017 0828   LDLCALC 84 06/30/2020 0849 .   Marland Kitchen ASCVD risk enhancing conditions: age >57, HTN . Lacks social connections . Does not contact provider office for questions/concerns  RN Care Manager Clinical Goal(s):  Marland Kitchen Over the next 120 days, patient will work with Consulting civil engineer, providers, and care team towards execution of optimized self-health management plan . patient will verbalize understanding of plan for effective management of HLD . patient will work with [facility] and pcp  to address needs related to effective management of HLD . Review of upcoming appointments- appointment with pcp on 12-22-2020 at 0800 am  Interventions: . Collaboration with [name], DO regarding development and update of comprehensive plan of care as evidenced by provider attestation and co-signature . Inter-disciplinary care team collaboration (see longitudinal plan of care) . Medication review performed; medication list updated in electronic medical record.  Bertram Savin care team collaboration (see longitudinal plan of care) . Referred to pharmacy team for assistance with HLD medication management . Evaluation of current treatment plan related to HLD  and patient's adherence to plan as established by provider. 11-21-2020: Denies any new concerns with her HLD. States she is doing well. Eating well, sleeping well and following dietary restrictions. Will continue to monitor.  . Advised patient to call the office for  changes or questions . Discussed plans with patient for ongoing care management follow up and provided patient with direct contact information for care management team   Patient Goals/Self-Care Activities: . Over the next 120 days, patient will:   - call for medicine refill 2 or 3 days before it runs out - call if I am sick and can't take my medicine - keep a list of all the medicines I take; vitamins and herbals too - learn to read medicine labels - use a pillbox to sort medicine - use an alarm clock or phone to remind me to take my medicine - change to whole grain breads, cereal, pasta - drink 6 to 8 glasses of water each day - eat 3 to 5 servings of fruits and vegetables each day - eat 5 or 6 small meals each day - fill half the plate with nonstarchy vegetables - limit fast food meals to no more than 1 per week - manage portion size - prepare main meal at home 3 to 5 days each week - read food labels for fat, fiber, carbohydrates and portion size - be open to making changes - I can manage, know and watch for signs of a heart attack - if I have chest pain, call for help - learn about small changes that will make a big difference - learn my personal risk factors  - barriers to meeting goals identified - change-talk evoked - choices provided - collaboration with team encouraged - decision-making supported - health risks reviewed - problem-solving facilitated - questions answered - readiness for change evaluated - reassurance provided - self-reflection promoted - self-reliance encouraged  Follow  Up Plan: Telephone follow up appointment with care management team member scheduled for:02-20-2021 at 11:45 am     Task: RNCM: HLD   Note:   Care Management Activities:    - barriers to meeting goals identified - change-talk evoked - choices provided - collaboration with team encouraged - decision-making supported - health risks reviewed - problem-solving facilitated -  questions answered - readiness for change evaluated - reassurance provided - self-reflection promoted - self-reliance encouraged       Patient Care Plan: RNCM: Hypertension (Adult)    Problem Identified: RNCM: Hypertension (Hypertension)   Priority: Medium    Long-Range Goal: RNCM: Hypertension Monitored   Note:   Objective:  . Last practice recorded BP readings:  . BP Readings from Last 3 Encounters: .  06/30/20 . 132/84 .  12/10/19 . 120/80 .  06/07/19 . 124/78 .    Marland Kitchen Most recent eGFR/CrCl: No results found for: EGFR  No components found for: CRCL Current Barriers:  Marland Kitchen Knowledge Deficits related to basic understanding of hypertension pathophysiology and self care management . Knowledge Deficits related to understanding of medications prescribed for management of hypertension . Limited Social Support . Lacks social connections . Does not contact provider office for questions/concerns Case Manager Clinical Goal(s):  Marland Kitchen Over the next 120 days, patient will verbalize understanding of plan for hypertension management . Over the next 120 days, patient will demonstrate improved adherence to prescribed treatment plan for hypertension as evidenced by taking all medications as prescribed, monitoring and recording blood pressure as directed, adhering to low sodium/DASH diet . Over the next 120 days, patient will demonstrate improved health management independence as evidenced by checking blood pressure as directed and notifying PCP if SBP>160 or DBP > 90, taking all medications as prescribe, and adhering to a low sodium diet as discussed. . Over the next 120 days, patient will verbalize basic understanding of hypertension disease process and self health management plan as evidenced by compliance with medications, compliance with heart healthy diet and working with the CCM team to optimize health and well being  Interventions:  . Collaboration with [name], DO regarding development  and update of comprehensive plan of care as evidenced by provider attestation and co-signature . Inter-disciplinary care team collaboration (see longitudinal plan of care) . Evaluation of current treatment plan related to hypertension self management and patient's adherence to plan as established by provider. 11-21-2020: The patient states she is doing well. Denies any issues with her blood pressures at this time. Denies headaches, light headedness or dizziness. Will continue to monitor. Will see the pcp for follow up in June.  . Provided education to patient re: stroke prevention, s/s of heart attack and stroke, DASH diet, complications of uncontrolled blood pressure . Reviewed medications with patient and discussed importance of compliance. 11-21-2020: The patient states compliance with medications. No acute issues noted.  . Discussed plans with patient for ongoing care management follow up and provided patient with direct contact information for care management team . Advised patient, providing education and rationale, to monitor blood pressure daily and record, calling PCP for findings outside established parameters.  . Review of upcoming appointments: pcp on 12-22-2020 at 0800 am Patient Goals: - blood pressure trends reviewed - depression screen reviewed - home or ambulatory blood pressure monitoring encouraged Self-Care Activities: - Self administers medications as prescribed Attends all scheduled provider appointments Calls provider office for new concerns, questions, or BP outside discussed parameters Checks BP and records as discussed Follows  a low sodium diet/DASH diet Follow Up Plan: Telephone follow up appointment with care management team member scheduled for: 02-20-2021 at 11:45 am   Task: RNCM: Identify and Monitor Blood Pressure Elevation   Note:   Care Management Activities:    - blood pressure trends reviewed - depression screen reviewed - home or ambulatory blood pressure  monitoring encouraged       Patient Care Plan: RNCM: Osteoarthritis (Adult)- Recent flare up in left hand    Problem Identified: RNCM: Pain (Osteoarthritis)- left hand flare up   Priority: Medium    Long-Range Goal: RNCM: Manage Pain- left hand   Priority: Medium  Note:   Current Barriers:  Marland Kitchen Knowledge Deficits related to managing acute/chronic pain . Non-adherence to scheduled provider appointments . Non-adherence to prescribed medication regimen . Difficulty obtaining medications . Chronic Disease Management support and education needs related to chronic pain . Unable to independently manage osteoarthritis pain in left hand . Does not maintain contact with provider office Nurse Case Manager Clinical Goal(s):  . patient will verbalize understanding of plan for managing pain . patient will work  with Consulting civil engineer to address any new concerns or needs related to pain in her left hand that is unresolved by current treatment regimen . patient will attend all scheduled medical appointments: next with pcp on 12-22-2020 . patient will demonstrate use of different relaxation  skills and/or diversional activities to assist with pain reduction (distraction, imagery, relaxation, massage, acupressure, TENS, heat, and cold application . patient will report pain at a level less than 3 to 4 on a 10-10 rating scale. Denies any pain today . patient will use pharmacological and nonpharmacological pain relief strategies . patient will verbalize acceptable level of pain relief and ability to engage in desired activities . patient will engage in desired activities without an increase in pain level Interventions:  . Collaboration with [name], DO regarding development and update of comprehensive plan of care as evidenced by provider attestation and co-signature . Inter-disciplinary care team collaboration (see longitudinal plan of care) . - careful application of heat or ice encouraged . - deep  breathing, relaxation and mindfulness use promoted . - effectiveness of pharmacologic therapy monitored . - medication-induced side effects managed . - misuse of pain medication assessed . - motivation and barriers to change assessed and addressed . - mutually acceptable comfort goal set . - pain assessed . - pain treatment goals reviewed . - premedication prior to activity encouraged . Evaluation of current treatment plan related to arthritis pain to left hand  and patient's adherence to plan as established by provider. . Advised patient to **call the office for changes in level or intensity of pain, concerns or questions.  . Provided education to patient re: icing hand, elevation, use of topical cream, use of brace, and calling the provider for changes in level or intensity of pain and discomfort.  . Reviewed medications with patient and discussed the patient is using a topical diclofenac on her left hand and using a hand brace that was recommended by the specialist. She states she can tell a big difference in the pain level in her hand. Denies any pain today.  . Discussed plans with patient for ongoing care management follow up and provided patient with direct contact information for care management team . Allow patient to maintain a diary of pain ratings, timing, precipitating events, medications, treatments, and what works best to relieve pain,  . Refer to support groups and  self-help groups . Educate patient about the use of pharmacological interventions for pain management- antianxiety, antidepressants, NSAIDS, opioid analgesics,  . Explain the importance of lifestyle modifications to effective pain management  Patient Goals/Self Care Activities:  . - complementary therapy use encouraged . - healthy lifestyle promoted . - medication side effects managed . - multimodal pain management plan developed . - pain assessed . - response to pain management plan monitored . - response to  pharmacologic therapy monitored . - support and encouragement provided . Self-administers medications as prescribed . Attends all scheduled provider appointments . Calls pharmacy for medication refills . Calls provider office for new concerns or questions Follow Up Plan: Telephone follow up appointment with care management team member scheduled for: 02-20-2021 at 1145 am     Task: RNCM: Management of pain in left hand OA   Note:   Care Management Activities:    - complementary therapy use encouraged - healthy lifestyle promoted - medication side effects managed - multimodal pain management plan developed - pain assessed - response to pain management plan monitored - response to pharmacologic therapy monitored - support and encouragement provided         Patient verbalizes understanding of instructions provided today and agrees to view in [place].   Telephone follow up appointment with care management team member scheduled for: 02-20-2021 at 1145 am  [place], MSN, [facility] Mobile: [phone]

## 2020-11-21 NOTE — Chronic Care Management (AMB) (Signed)
Chronic Care Management   CCM RN Visit Note  11/21/2020 Name: Heather James MRN: [id number] DOB: 1951-06-27  Subjective: Heather James is a 70 y.o. year old female who is a primary care patient of [name], DO. The care management team was consulted for assistance with disease management and care coordination needs.    Engaged with patient by telephone for follow up visit in response to provider referral for case management and/or care coordination services.   Consent to Services:  The patient was given information about Chronic Care Management services, agreed to services, and gave verbal consent prior to initiation of services.  Please see initial visit note for detailed documentation.   Patient agreed to services and verbal consent obtained.   Assessment: Review of patient past medical history, allergies, medications, health status, including review of consultants reports, laboratory and other test data, was performed as part of comprehensive evaluation and provision of chronic care management services.   SDOH (Social Determinants of Health) assessments and interventions performed:    CCM Care Plan  No Known Allergies  Outpatient Encounter Medications as of 11/21/2020  Medication Sig  . albuterol (VENTOLIN HFA) 108 (90 Base) MCG/ACT inhaler Inhale 2 puffs into the lungs every 6 (six) hours as needed for wheezing or shortness of breath.  Marland Kitchen aspirin 81 MG tablet Take 81 mg by mouth daily.  Marland Kitchen atorvastatin (LIPITOR) 20 MG tablet TAKE 1 TABLET(20 MG) BY MOUTH DAILY  . calcium carbonate (OS-CAL - DOSED IN MG OF ELEMENTAL CALCIUM) 1250 (500 Ca) MG tablet Take 1 tablet by mouth.  . calcium-vitamin D (OSCAL WITH D) 500-200 MG-UNIT tablet Take 1 tablet by mouth.  . diclofenac sodium (VOLTAREN) 1 % GEL Apply 4 g topically 4 (four) times daily.  . hydrochlorothiazide (HYDRODIURIL) 50 MG tablet Take 1 tablet (50 mg total) by mouth every other day.  . meloxicam (MOBIC) 7.5 MG  tablet Take 7.5 mg by mouth daily as needed.  . mometasone (NASONEX) 50 MCG/ACT nasal spray Place 2 sprays into the nose daily.  . potassium chloride SA (K-DUR,KLOR-CON) 20 MEQ tablet TK 1 T PO QD WF  . raloxifene (EVISTA) 60 MG tablet Take 1 tablet (60 mg total) by mouth daily.   No facility-administered encounter medications on file as of 11/21/2020.    Patient Active Problem List   Diagnosis Date Noted  . BMI 40.0-44.9, adult ([place]) 06/05/2018  . Hypercholesteremia 06/05/2018  . Advanced care planning/counseling discussion 05/30/2017  . Tendonitis of foot 05/24/2016  . Hypertension 05/23/2015  . CSF leak from ear 05/13/2015  . Osteopenia 05/13/2015  . Morbid obesity ([place]) 05/13/2015    Conditions to be addressed/monitored:HTN, HLD and OA of left hand  Care Plan : RNCM: HLD  Updates made by Vanita Ingles since 11/21/2020 12:00 AM    Problem: RNCM: HLD Management   Priority: Medium    Long-Range Goal: RNCM: HLD management   Priority: Medium  Note:   Current Barriers:  . Poorly controlled hyperlipidemia, complicated by HTN . Current antihyperlipidemic regimen: Lipitor 20 mg QD . Most recent lipid panel:     Component Value Date/Time   CHOL 152 06/30/2020 0849   TRIG 95 06/30/2020 0849   HDL 50 06/30/2020 0849   CHOLHDL 4.0 06/03/2017 0828   LDLCALC 84 06/30/2020 0849 .   Marland Kitchen ASCVD risk enhancing conditions: age >23, HTN . Lacks social connections . Does not contact provider office for questions/concerns  RN Care Manager Clinical Goal(s):  Marland Kitchen Over  the next 120 days, patient will work with Consulting civil engineer, providers, and care team towards execution of optimized self-health management plan . patient will verbalize understanding of plan for effective management of HLD . patient will work with [facility] and pcp  to address needs related to effective management of HLD . Review of upcoming appointments- appointment with pcp on 12-22-2020 at 0800 am  Interventions: . Collaboration  with [name], DO regarding development and update of comprehensive plan of care as evidenced by provider attestation and co-signature . Inter-disciplinary care team collaboration (see longitudinal plan of care) . Medication review performed; medication list updated in electronic medical record.  Bertram Savin care team collaboration (see longitudinal plan of care) . Referred to pharmacy team for assistance with HLD medication management . Evaluation of current treatment plan related to HLD  and patient's adherence to plan as established by provider. 11-21-2020: Denies any new concerns with her HLD. States she is doing well. Eating well, sleeping well and following dietary restrictions. Will continue to monitor.  . Advised patient to call the office for changes or questions . Discussed plans with patient for ongoing care management follow up and provided patient with direct contact information for care management team   Patient Goals/Self-Care Activities: . Over the next 120 days, patient will:   - call for medicine refill 2 or 3 days before it runs out - call if I am sick and can't take my medicine - keep a list of all the medicines I take; vitamins and herbals too - learn to read medicine labels - use a pillbox to sort medicine - use an alarm clock or phone to remind me to take my medicine - change to whole grain breads, cereal, pasta - drink 6 to 8 glasses of water each day - eat 3 to 5 servings of fruits and vegetables each day - eat 5 or 6 small meals each day - fill half the plate with nonstarchy vegetables - limit fast food meals to no more than 1 per week - manage portion size - prepare main meal at home 3 to 5 days each week - read food labels for fat, fiber, carbohydrates and portion size - be open to making changes - I can manage, know and watch for signs of a heart attack - if I have chest pain, call for help - learn about small changes that will make a big  difference - learn my personal risk factors  - barriers to meeting goals identified - change-talk evoked - choices provided - collaboration with team encouraged - decision-making supported - health risks reviewed - problem-solving facilitated - questions answered - readiness for change evaluated - reassurance provided - self-reflection promoted - self-reliance encouraged  Follow Up Plan: Telephone follow up appointment with care management team member scheduled for:02-20-2021 at 11:45 am     Care Plan : RNCM: Hypertension (Adult)  Updates made by Vanita Ingles since 11/21/2020 12:00 AM    Problem: RNCM: Hypertension (Hypertension)   Priority: Medium    Long-Range Goal: RNCM: Hypertension Monitored   Note:   Objective:  . Last practice recorded BP readings:  . BP Readings from Last 3 Encounters: .  06/30/20 . 132/84 .  12/10/19 . 120/80 .  06/07/19 . 124/78 .    Marland Kitchen Most recent eGFR/CrCl: No results found for: EGFR  No components found for: CRCL Current Barriers:  Marland Kitchen Knowledge Deficits related to basic understanding of hypertension pathophysiology and self care management .  Knowledge Deficits related to understanding of medications prescribed for management of hypertension . Limited Social Support . Lacks social connections . Does not contact provider office for questions/concerns Case Manager Clinical Goal(s):  Marland Kitchen Over the next 120 days, patient will verbalize understanding of plan for hypertension management . Over the next 120 days, patient will demonstrate improved adherence to prescribed treatment plan for hypertension as evidenced by taking all medications as prescribed, monitoring and recording blood pressure as directed, adhering to low sodium/DASH diet . Over the next 120 days, patient will demonstrate improved health management independence as evidenced by checking blood pressure as directed and notifying PCP if SBP>160 or DBP > 90, taking all medications as prescribe,  and adhering to a low sodium diet as discussed. . Over the next 120 days, patient will verbalize basic understanding of hypertension disease process and self health management plan as evidenced by compliance with medications, compliance with heart healthy diet and working with the CCM team to optimize health and well being  Interventions:  . Collaboration with [name], DO regarding development and update of comprehensive plan of care as evidenced by provider attestation and co-signature . Inter-disciplinary care team collaboration (see longitudinal plan of care) . Evaluation of current treatment plan related to hypertension self management and patient's adherence to plan as established by provider. 11-21-2020: The patient states she is doing well. Denies any issues with her blood pressures at this time. Denies headaches, light headedness or dizziness. Will continue to monitor. Will see the pcp for follow up in June.  . Provided education to patient re: stroke prevention, s/s of heart attack and stroke, DASH diet, complications of uncontrolled blood pressure . Reviewed medications with patient and discussed importance of compliance. 11-21-2020: The patient states compliance with medications. No acute issues noted.  . Discussed plans with patient for ongoing care management follow up and provided patient with direct contact information for care management team . Advised patient, providing education and rationale, to monitor blood pressure daily and record, calling PCP for findings outside established parameters.  . Review of upcoming appointments: pcp on 12-22-2020 at 0800 am Patient Goals: - blood pressure trends reviewed - depression screen reviewed - home or ambulatory blood pressure monitoring encouraged Self-Care Activities: - Self administers medications as prescribed Attends all scheduled provider appointments Calls provider office for new concerns, questions, or BP outside discussed  parameters Checks BP and records as discussed Follows a low sodium diet/DASH diet Follow Up Plan: Telephone follow up appointment with care management team member scheduled for: 02-20-2021 at 11:45 am   Care Plan : RNCM: Osteoarthritis (Adult)- Recent flare up in left hand  Updates made by Vanita Ingles since 11/21/2020 12:00 AM    Problem: RNCM: Pain (Osteoarthritis)- left hand flare up   Priority: Medium    Long-Range Goal: RNCM: Manage Pain- left hand   Priority: Medium  Note:   Current Barriers:  Marland Kitchen Knowledge Deficits related to managing acute/chronic pain . Non-adherence to scheduled provider appointments . Non-adherence to prescribed medication regimen . Difficulty obtaining medications . Chronic Disease Management support and education needs related to chronic pain . Unable to independently manage osteoarthritis pain in left hand . Does not maintain contact with provider office Nurse Case Manager Clinical Goal(s):  . patient will verbalize understanding of plan for managing pain . patient will work  with Consulting civil engineer to address any new concerns or needs related to pain in her left hand that is unresolved by current treatment  regimen . patient will attend all scheduled medical appointments: next with pcp on 12-22-2020 . patient will demonstrate use of different relaxation  skills and/or diversional activities to assist with pain reduction (distraction, imagery, relaxation, massage, acupressure, TENS, heat, and cold application . patient will report pain at a level less than 3 to 4 on a 10-10 rating scale. Denies any pain today . patient will use pharmacological and nonpharmacological pain relief strategies . patient will verbalize acceptable level of pain relief and ability to engage in desired activities . patient will engage in desired activities without an increase in pain level Interventions:  . Collaboration with [name], DO regarding development and update of  comprehensive plan of care as evidenced by provider attestation and co-signature . Inter-disciplinary care team collaboration (see longitudinal plan of care) . - careful application of heat or ice encouraged . - deep breathing, relaxation and mindfulness use promoted . - effectiveness of pharmacologic therapy monitored . - medication-induced side effects managed . - misuse of pain medication assessed . - motivation and barriers to change assessed and addressed . - mutually acceptable comfort goal set . - pain assessed . - pain treatment goals reviewed . - premedication prior to activity encouraged . Evaluation of current treatment plan related to arthritis pain to left hand  and patient's adherence to plan as established by provider. . Advised patient to **call the office for changes in level or intensity of pain, concerns or questions.  . Provided education to patient re: icing hand, elevation, use of topical cream, use of brace, and calling the provider for changes in level or intensity of pain and discomfort.  . Reviewed medications with patient and discussed the patient is using a topical diclofenac on her left hand and using a hand brace that was recommended by the specialist. She states she can tell a big difference in the pain level in her hand. Denies any pain today.  . Discussed plans with patient for ongoing care management follow up and provided patient with direct contact information for care management team . Allow patient to maintain a diary of pain ratings, timing, precipitating events, medications, treatments, and what works best to relieve pain,  . Refer to support groups and self-help groups . Educate patient about the use of pharmacological interventions for pain management- antianxiety, antidepressants, NSAIDS, opioid analgesics,  . Explain the importance of lifestyle modifications to effective pain management  Patient Goals/Self Care Activities:  . - complementary therapy  use encouraged . - healthy lifestyle promoted . - medication side effects managed . - multimodal pain management plan developed . - pain assessed . - response to pain management plan monitored . - response to pharmacologic therapy monitored . - support and encouragement provided . Self-administers medications as prescribed . Attends all scheduled provider appointments . Calls pharmacy for medication refills . Calls provider office for new concerns or questions Follow Up Plan: Telephone follow up appointment with care management team member scheduled for: 02-20-2021 at 1145 am     Task: RNCM: Management of pain in left hand OA   Note:   Care Management Activities:    - complementary therapy use encouraged - healthy lifestyle promoted - medication side effects managed - multimodal pain management plan developed - pain assessed - response to pain management plan monitored - response to pharmacologic therapy monitored - support and encouragement provided         Plan:Telephone follow up appointment with care management team member scheduled for:  02-20-2021 at [place] am  [name] RN, MSN, [facility] Mobile: [phone]

## 2020-12-08 ENCOUNTER — Ambulatory Visit: Payer: PPO | Admitting: Family Medicine

## 2020-12-21 NOTE — Progress Notes (Signed)
BP 126/78   Pulse 80   Temp 98.8 F (37.1 C) (Oral)   Ht 5\' 2"  (1.575 m)   Wt 240 lb (108.9 kg)   LMP  (LMP Unknown)   SpO2 97%   BMI 43.90 kg/m    Subjective:    Patient ID: [name], female    DOB: 08/12/1950, 70 y.o.   MRN: [id number]  HPI: Anjannette Gauger is a 70 y.o. female  Chief Complaint  Patient presents with   Hyperlipidemia   Hypertension   HYPERTENSION / [place] Satisfied with current treatment? yes Duration of hypertension: chronic BP monitoring frequency: not checking BP range:  BP medication side effects: no Past BP meds: HCTZ Duration of hyperlipidemia: chronic Cholesterol medication side effects: no Cholesterol supplements: none Past cholesterol medications: atorvastatin Medication compliance: excellent compliance Aspirin: yes Recent stressors: no Recurrent headaches: no Visual changes: no Palpitations: no Dyspnea: no Chest pain: no Lower extremity edema: no Dizzy/lightheaded: no  Relevant past medical, surgical, family and social history reviewed and updated as indicated. Interim medical history since our last visit reviewed. Allergies and medications reviewed and updated.  Review of Systems  Constitutional: Negative.   HENT:  Positive for congestion and postnasal drip. Negative for dental problem, drooling, ear discharge, ear pain, facial swelling, hearing loss, mouth sores, nosebleeds, rhinorrhea, sinus pressure, sinus pain, sneezing, sore throat, tinnitus, trouble swallowing and voice change.   Respiratory: Negative.    Cardiovascular: Negative.   Gastrointestinal: Negative.   Musculoskeletal: Negative.   Psychiatric/Behavioral: Negative.     Per HPI unless specifically indicated above     Objective:    BP 126/78   Pulse 80   Temp 98.8 F (37.1 C) (Oral)   Ht 5\' 2"  (1.575 m)   Wt 240 lb (108.9 kg)   LMP  (LMP Unknown)   SpO2 97%   BMI 43.90 kg/m   Wt Readings from Last 3 Encounters:  12/22/20 240 lb  (108.9 kg)  06/30/20 236 lb 12.8 oz (107.4 kg)  06/09/20 234 lb (106.1 kg)    Physical Exam Vitals and nursing note reviewed.  Constitutional:      General: She is not in acute distress.    Appearance: Normal appearance. She is not ill-appearing, toxic-appearing or diaphoretic.  HENT:     Head: Normocephalic and atraumatic.     Right Ear: External ear normal.     Left Ear: External ear normal.     Nose: Nose normal.     Mouth/Throat:     Mouth: Mucous membranes are moist.     Pharynx: Oropharynx is clear.  Eyes:     General: No scleral icterus.       Right eye: No discharge.        Left eye: No discharge.     Extraocular Movements: Extraocular movements intact.     Conjunctiva/sclera: Conjunctivae normal.     Pupils: Pupils are equal, round, and reactive to light.  Cardiovascular:     Rate and Rhythm: Normal rate and regular rhythm.     Pulses: Normal pulses.     Heart sounds: Normal heart sounds. No murmur heard.   No friction rub. No gallop.  Pulmonary:     Effort: Pulmonary effort is normal. No respiratory distress.     Breath sounds: Normal breath sounds. No stridor. No wheezing, rhonchi or rales.  Chest:     Chest wall: No tenderness.  Musculoskeletal:        General: Normal range of motion.  Cervical back: Normal range of motion and neck supple.  Skin:    General: Skin is warm and dry.     Capillary Refill: Capillary refill takes less than 2 seconds.     Coloration: Skin is not jaundiced or pale.     Findings: No bruising, erythema, lesion or rash.  Neurological:     General: No focal deficit present.     Mental Status: She is alert and oriented to person, place, and time. Mental status is at baseline.  Psychiatric:        Mood and Affect: Mood normal.        Behavior: Behavior normal.        Thought Content: Thought content normal.        Judgment: Judgment normal.    Results for orders placed or performed in visit on 06/30/20  Microscopic Examination    Urine  Result Value Ref Range   WBC, UA 6-10 (A) 0 - 5 /hpf   RBC 3-10 (A) 0 - 2 /hpf   Epithelial Cells (non renal) 0-10 0 - 10 /hpf   Bacteria, UA None seen None seen/Few  CBC with Differential/Platelet  Result Value Ref Range   WBC 7.0 3.4 - 10.8 x10E3/uL   RBC 5.28 3.77 - 5.28 x10E6/uL   Hemoglobin 15.6 11.1 - 15.9 g/dL   Hematocrit 47.8 (H) 34.0 - 46.6 %   MCV 91 79 - 97 fL   MCH 29.5 26.6 - 33.0 pg   MCHC 32.6 31.5 - 35.7 g/dL   RDW 12.9 11.7 - 15.4 %   Platelets 356 150 - 450 x10E3/uL   Neutrophils 72 Not Estab. %   Lymphs 17 Not Estab. %   Monocytes 9 Not Estab. %   Eos 1 Not Estab. %   Basos 1 Not Estab. %   Neutrophils Absolute 5.1 1.4 - 7.0 x10E3/uL   Lymphocytes Absolute 1.2 0.7 - 3.1 x10E3/uL   Monocytes Absolute 0.6 0.1 - 0.9 x10E3/uL   EOS (ABSOLUTE) 0.1 0.0 - 0.4 x10E3/uL   Basophils Absolute 0.1 0.0 - 0.2 x10E3/uL   Immature Granulocytes 0 Not Estab. %   Immature Grans (Abs) 0.0 0.0 - 0.1 x10E3/uL  Comprehensive metabolic panel  Result Value Ref Range   Glucose 106 (H) 65 - 99 mg/dL   BUN 13 8 - 27 mg/dL   Creatinine, Ser 0.88 0.57 - 1.00 mg/dL   GFR calc non Af Amer 67 >59 mL/min/1.73   GFR calc Af Amer 77 >59 mL/min/1.73   BUN/Creatinine Ratio 15 12 - 28   Sodium 140 134 - 144 mmol/L   Potassium 4.2 3.5 - 5.2 mmol/L   Chloride 99 96 - 106 mmol/L   CO2 26 20 - 29 mmol/L   Calcium 9.9 8.7 - 10.3 mg/dL   Total Protein 7.0 6.0 - 8.5 g/dL   Albumin 4.6 3.8 - 4.8 g/dL   Globulin, Total 2.4 1.5 - 4.5 g/dL   Albumin/Globulin Ratio 1.9 1.2 - 2.2   Bilirubin Total 1.1 0.0 - 1.2 mg/dL   Alkaline Phosphatase 136 (H) 44 - 121 IU/L   AST 25 0 - 40 IU/L   ALT 23 0 - 32 IU/L  Lipid Panel w/o Chol/HDL Ratio  Result Value Ref Range   Cholesterol, Total 152 100 - 199 mg/dL   Triglycerides 95 0 - 149 mg/dL   HDL 50 >39 mg/dL   VLDL Cholesterol Cal 18 5 - 40 mg/dL   LDL Chol Calc (NIH) 84 0 -  99 mg/dL  Urinalysis, Routine w reflex microscopic  Result Value Ref  Range   Specific Gravity, UA 1.020 1.005 - 1.030   pH, UA 5.5 5.0 - 7.5   Color, UA Yellow Yellow   Appearance Ur Clear Clear   Leukocytes,UA 1+ (A) Negative   Protein,UA Negative Negative/Trace   Glucose, UA Negative Negative   Ketones, UA Negative Negative   RBC, UA Trace (A) Negative   Bilirubin, UA Negative Negative   Urobilinogen, Ur 1.0 0.2 - 1.0 mg/dL   Nitrite, UA Negative Negative   Microscopic Examination See below:   TSH  Result Value Ref Range   TSH 2.200 0.450 - 4.500 uIU/mL  Microalbumin, Urine Waived  Result Value Ref Range   Microalb, Ur Waived 80 (H) 0 - 19 mg/L   Creatinine, Urine Waived 200 10 - 300 mg/dL   Microalb/Creat Ratio <30 <30 mg/g      Assessment & Plan:   Problem List Items Addressed This Visit       Cardiovascular and Mediastinum   Hypertension - Primary    Under good control on current regimen. Continue current regimen. Continue to monitor. Call with any concerns. Refills given. Labs drawn today.        Relevant Medications   atorvastatin (LIPITOR) 20 MG tablet   hydrochlorothiazide (HYDRODIURIL) 50 MG tablet   Other Relevant Orders   Comprehensive metabolic panel     Other   Hypercholesteremia    Under good control on current regimen. Continue current regimen. Continue to monitor. Call with any concerns. Refills given. Labs drawn today.       Relevant Medications   atorvastatin (LIPITOR) 20 MG tablet   hydrochlorothiazide (HYDRODIURIL) 50 MG tablet   Other Relevant Orders   Comprehensive metabolic panel   Lipid Panel w/o Chol/HDL Ratio   Other Visit Diagnoses     Post-nasal drip       Encouraged her to start her nasonex again. Call with any concerns.         Follow up plan: Return in about 6 months (around 06/23/2021) for physical.

## 2020-12-22 ENCOUNTER — Telehealth: Payer: Self-pay | Admitting: *Deleted

## 2020-12-22 ENCOUNTER — Other Ambulatory Visit: Payer: Self-pay

## 2020-12-22 ENCOUNTER — Ambulatory Visit (INDEPENDENT_AMBULATORY_CARE_PROVIDER_SITE_OTHER): Payer: PPO | Admitting: Family Medicine

## 2020-12-22 ENCOUNTER — Encounter: Payer: Self-pay | Admitting: Family Medicine

## 2020-12-22 VITALS — BP 126/78 | HR 80 | Temp 98.8°F | Ht 62.0 in | Wt 240.0 lb

## 2020-12-22 DIAGNOSIS — E7800 Pure hypercholesterolemia, unspecified: Secondary | ICD-10-CM

## 2020-12-22 DIAGNOSIS — I10 Essential (primary) hypertension: Secondary | ICD-10-CM | POA: Diagnosis not present

## 2020-12-22 DIAGNOSIS — R0982 Postnasal drip: Secondary | ICD-10-CM | POA: Diagnosis not present

## 2020-12-22 MED ORDER — HYDROCHLOROTHIAZIDE 50 MG PO TABS: 50.0000 mg | ORAL_TABLET | ORAL | 1 refills | Status: DC

## 2020-12-22 MED ORDER — ATORVASTATIN CALCIUM 20 MG PO TABS: 20.0000 mg | ORAL_TABLET | Freq: Every day | ORAL | 1 refills | Status: DC

## 2020-12-22 MED ORDER — MOMETASONE FUROATE 50 MCG/ACT NA SUSP: 2.0000 | Freq: Every day | NASAL | 12 refills | Status: DC

## 2020-12-22 MED ORDER — MELOXICAM 7.5 MG PO TABS: 7.5000 mg | ORAL_TABLET | Freq: Every day | ORAL | 1 refills | Status: DC | PRN

## 2020-12-22 MED ORDER — ALBUTEROL SULFATE HFA 108 (90 BASE) MCG/ACT IN AERS: 2.0000 | INHALATION_SPRAY | Freq: Four times a day (QID) | RESPIRATORY_TRACT | 6 refills | Status: DC | PRN

## 2020-12-22 NOTE — Assessment & Plan Note (Signed)
Under good control on current regimen. Continue current regimen. Continue to monitor. Call with any concerns. Refills given. Labs drawn today.   

## 2020-12-22 NOTE — Telephone Encounter (Signed)
Pt. Need to Return in about 6 months (around 06/23/2021) for physical. Pt. Left w/out scheduling. Pt was called & VM left.

## 2020-12-23 LAB — COMPREHENSIVE METABOLIC PANEL
ALT: 23 IU/L (ref 0–32)
AST: 26 IU/L (ref 0–40)
Albumin/Globulin Ratio: 2 (ref 1.2–2.2)
Albumin: 4.6 g/dL (ref 3.8–4.8)
Alkaline Phosphatase: 134 IU/L — ABNORMAL HIGH (ref 44–121)
BUN/Creatinine Ratio: 16 (ref 12–28)
BUN: 17 mg/dL (ref 8–27)
Bilirubin Total: 0.9 mg/dL (ref 0.0–1.2)
CO2: 25 mmol/L (ref 20–29)
Calcium: 10.1 mg/dL (ref 8.7–10.3)
Chloride: 100 mmol/L (ref 96–106)
Creatinine, Ser: 1.05 mg/dL — ABNORMAL HIGH (ref 0.57–1.00)
Globulin, Total: 2.3 g/dL (ref 1.5–4.5)
Glucose: 103 mg/dL — ABNORMAL HIGH (ref 65–99)
Potassium: 5 mmol/L (ref 3.5–5.2)
Sodium: 143 mmol/L (ref 134–144)
Total Protein: 6.9 g/dL (ref 6.0–8.5)
eGFR: 57 mL/min/{1.73_m2} — ABNORMAL LOW (ref >59)

## 2020-12-23 LAB — LIPID PANEL W/O CHOL/HDL RATIO
Cholesterol, Total: 154 mg/dL (ref 100–199)
HDL: 66 mg/dL (ref >39)
LDL Chol Calc (NIH): 73 mg/dL (ref 0–99)
Triglycerides: 76 mg/dL (ref 0–149)
VLDL Cholesterol Cal: 15 mg/dL (ref 5–40)

## 2020-12-30 ENCOUNTER — Encounter: Payer: Self-pay | Admitting: Family Medicine

## 2021-02-20 ENCOUNTER — Ambulatory Visit (INDEPENDENT_AMBULATORY_CARE_PROVIDER_SITE_OTHER): Payer: PPO

## 2021-02-20 ENCOUNTER — Telehealth: Payer: Self-pay

## 2021-02-20 ENCOUNTER — Telehealth: Payer: PPO | Admitting: General Practice

## 2021-02-20 DIAGNOSIS — Z6841 Body Mass Index (BMI) 40.0 and over, adult: Secondary | ICD-10-CM

## 2021-02-20 DIAGNOSIS — M19042 Primary osteoarthritis, left hand: Secondary | ICD-10-CM | POA: Diagnosis not present

## 2021-02-20 DIAGNOSIS — E7800 Pure hypercholesterolemia, unspecified: Secondary | ICD-10-CM

## 2021-02-20 DIAGNOSIS — I10 Essential (primary) hypertension: Secondary | ICD-10-CM

## 2021-02-20 DIAGNOSIS — M19041 Primary osteoarthritis, right hand: Secondary | ICD-10-CM

## 2021-02-20 NOTE — Patient Instructions (Signed)
Visit Information  PATIENT GOALS:  Goals Addressed             This Visit's Progress    RNCM: Develop a Weight Loss Readiness Plan       Timeframe:  Long-Range Goal Priority:  High Start Date:     02-20-2021                        Expected End Date:  02-20-2022                     Follow Up Date 04/22/2021    - get rid of junk food - identify pros and cons of weight loss - identify what might get in the way of success - list ways to deal with barriers to success - stock up on healthy food choices - track feelings, emotional and physical, when eating - track current amount and type of exercise - track what and where I am eating - track why I am eating    Why is this important?   Losing weight requires time to prepare your mind and your home.  Identify your eating habits and the emotions attached to eating.  Think about ways to be successful.  When you are not ready, you could lose motivation and give up on your plan.     Notes: 02-20-2021: Since coming to the pcp in June the patient states she has lost 8 pounds. Praised the patient for weight loss. She is cutting back on portions and stepping back from the table. She only eats red meats one time a week and has cut the sweets and breads. She does not like fish but eats a lot of chicken. Has never been a fan of salt. She feels like the slow weight loss with be effective and she can keep it off. Will continue to monitor.         Patient verbalizes understanding of instructions provided today and agrees to view in [place].   Telephone follow up appointment with care management team member scheduled for:04-22-2021 at 1145 am  Mon[place], MSN, [facility] Mobile: [phone]

## 2021-02-20 NOTE — Chronic Care Management (AMB) (Signed)
Chronic Care Management   CCM RN Visit Note  02/20/2021 Name: Heather James MRN: [id number] DOB: 09-17-1950  Subjective: Heather James is a 70 y.o. year old female who is a primary care patient of [name], DO. The care management team was consulted for assistance with disease management and care coordination needs.    Engaged with patient by telephone for follow up visit in response to provider referral for case management and/or care coordination services.   Consent to Services:  The patient was given information about Chronic Care Management services, agreed to services, and gave verbal consent prior to initiation of services.  Please see initial visit note for detailed documentation.   Patient agreed to services and verbal consent obtained.   Assessment: Review of patient past medical history, allergies, medications, health status, including review of consultants reports, laboratory and other test data, was performed as part of comprehensive evaluation and provision of chronic care management services.   SDOH (Social Determinants of Health) assessments and interventions performed:  SDOH Interventions    Flowsheet Row Most Recent Value  SDOH Interventions   Physical Activity Interventions Other (Comments)  [no structured activity, stays busy and active]  Social Connections Interventions Other (Comment)  [has good support system]        CCM Care Plan  No Known Allergies  Outpatient Encounter Medications as of 02/20/2021  Medication Sig   albuterol (VENTOLIN HFA) 108 (90 Base) MCG/ACT inhaler Inhale 2 puffs into the lungs every 6 (six) hours as needed for wheezing or shortness of breath.   aspirin 81 MG tablet Take 81 mg by mouth daily.   atorvastatin (LIPITOR) 20 MG tablet Take 1 tablet (20 mg total) by mouth daily.   calcium carbonate (OS-CAL - DOSED IN MG OF ELEMENTAL CALCIUM) 1250 (500 Ca) MG tablet Take 1 tablet by mouth.   calcium-vitamin D (OSCAL WITH D)  500-200 MG-UNIT tablet Take 1 tablet by mouth.   diclofenac sodium (VOLTAREN) 1 % GEL Apply 4 g topically 4 (four) times daily.   hydrochlorothiazide (HYDRODIURIL) 50 MG tablet Take 1 tablet (50 mg total) by mouth every other day.   meloxicam (MOBIC) 7.5 MG tablet Take 1 tablet (7.5 mg total) by mouth daily as needed.   mometasone (NASONEX) 50 MCG/ACT nasal spray Place 2 sprays into the nose daily.   potassium chloride SA (K-DUR,KLOR-CON) 20 MEQ tablet TK 1 T PO QD WF   raloxifene (EVISTA) 60 MG tablet Take 1 tablet (60 mg total) by mouth daily.   No facility-administered encounter medications on file as of 02/20/2021.    Patient Active Problem List   Diagnosis Date Noted   BMI 40.0-44.9, adult ([place]) 06/05/2018   Hypercholesteremia 06/05/2018   Advanced care planning/counseling discussion 05/30/2017   Tendonitis of foot 05/24/2016   Hypertension 05/23/2015   CSF leak from ear 05/13/2015   Osteopenia 05/13/2015   Morbid obesity ([place]) 05/13/2015    Conditions to be addressed/monitored:HTN, HLD, and chronic pain, and weight loss/obesity  Care Plan : RNCM: HLD  Updates made by Vanita Ingles, RN since 02/20/2021 12:00 AM     Problem: RNCM: HLD Management   Priority: Medium     Long-Range Goal: RNCM: HLD management   Start Date: 08/22/2020  Expected End Date: 08/22/2021  This Visit's Progress: On track  Priority: Medium  Note:   Current Barriers:  Poorly controlled hyperlipidemia, complicated by HTN Current antihyperlipidemic regimen: Lipitor 20 mg QD Most recent lipid panel:  Component Value Date/Time   CHOL 154 12/22/2020 0809   TRIG 76 12/22/2020 0809   HDL 66 12/22/2020 0809   CHOLHDL 4.0 06/03/2017 0828   LDLCALC 73 12/22/2020 0809   ASCVD risk enhancing conditions: age >42, HTN Lacks social connections Does not contact provider office for questions/concerns  RN Care Manager Clinical Goal(s):  patient will work with Consulting civil engineer, providers, and care team  towards execution of optimized self-health management plan patient will verbalize understanding of plan for effective management of HLD patient will work with [facility] and pcp  to address needs related to effective management of HLD Review of upcoming appointments- appointment with pcp on 06-15-2021  Interventions: Collaboration with [name], DO regarding development and update of comprehensive plan of care as evidenced by provider attestation and co-signature Inter-disciplinary care team collaboration (see longitudinal plan of care) Medication review performed; medication list updated in electronic medical record. 02-20-2021: Is compliant with medications, no new concerns at this time Inter-disciplinary care team collaboration (see longitudinal plan of care) Referred to pharmacy team for assistance with HLD medication management Evaluation of current treatment plan related to HLD  and patient's adherence to plan as established by provider. 02-20-2021: Denies any new concerns with her HLD. States she is doing well. Eating well, sleeping well and following dietary restrictions. Will continue to monitor. Is actively losing weight Advised patient to call the office for changes or questions Discussed plans with patient for ongoing care management follow up and provided patient with direct contact information for care management team   Patient Goals/Self-Care Activities:  patient will:   - call for medicine refill 2 or 3 days before it runs out - call if I am sick and can't take my medicine - keep a list of all the medicines I take; vitamins and herbals too - learn to read medicine labels - use a pillbox to sort medicine - use an alarm clock or phone to remind me to take my medicine - change to whole grain breads, cereal, pasta - drink 6 to 8 glasses of water each day - eat 3 to 5 servings of fruits and vegetables each day - eat 5 or 6 small meals each day - fill half the plate with  nonstarchy vegetables - limit fast food meals to no more than 1 per week - manage portion size - prepare main meal at home 3 to 5 days each week - read food labels for fat, fiber, carbohydrates and portion size - be open to making changes - I can manage, know and watch for signs of a heart attack - if I have chest pain, call for help - learn about small changes that will make a big difference - learn my personal risk factors  - barriers to meeting goals identified - change-talk evoked - choices provided - collaboration with team encouraged - decision-making supported - health risks reviewed - problem-solving facilitated - questions answered - readiness for change evaluated - reassurance provided - self-reflection promoted - self-reliance encouraged  Follow Up Plan: Telephone follow up appointment with care management team member scheduled for:04-22-2021 at 11:45 am      Task: RNCM: HLD Completed 02/20/2021  Outcome: Positive  Note:   Care Management Activities:    - barriers to meeting goals identified - change-talk evoked - choices provided - collaboration with team encouraged - decision-making supported - health risks reviewed - problem-solving facilitated - questions answered - readiness for change evaluated - reassurance provided - self-reflection promoted -  self-reliance encouraged        Care Plan : RNCM: Hypertension (Adult)  Updates made by Vanita Ingles, RN since 02/20/2021 12:00 AM     Problem: RNCM: Hypertension (Hypertension)   Priority: Medium     Long-Range Goal: RNCM: Hypertension Monitored   Start Date: 08/22/2020  Expected End Date: 08/22/2021  This Visit's Progress: On track  Priority: Medium  Note:   Objective:  Last practice recorded BP readings:  BP Readings from Last 3 Encounters:  12/22/20 126/78  06/30/20 132/84  12/10/19 120/80    Most recent eGFR/CrCl: No results found for: EGFR  No components found for: CRCL Current  Barriers:  Knowledge Deficits related to basic understanding of hypertension pathophysiology and self care management Knowledge Deficits related to understanding of medications prescribed for management of hypertension Limited Social Support Lacks social connections Does not contact provider office for questions/concerns Case Manager Clinical Goal(s):   patient will verbalize understanding of plan for hypertension management  patient will demonstrate improved adherence to prescribed treatment plan for hypertension as evidenced by taking all medications as prescribed, monitoring and recording blood pressure as directed, adhering to low sodium/DASH diet  patient will demonstrate improved health management independence as evidenced by checking blood pressure as directed and notifying PCP if SBP>160 or DBP > 90, taking all medications as prescribe, and adhering to a low sodium diet as discussed.  patient will verbalize basic understanding of hypertension disease process and self health management plan as evidenced by compliance with medications, compliance with heart healthy diet and working with the CCM team to optimize health and well being  Interventions:  Collaboration with [name], DO regarding development and update of comprehensive plan of care as evidenced by provider attestation and co-signature Inter-disciplinary care team collaboration (see longitudinal plan of care) Evaluation of current treatment plan related to hypertension self management and patient's adherence to plan as established by provider. 02-20-2021: The patient states she is doing well. Denies any issues with her blood pressures at this time. Denies headaches, light headedness or dizziness. The patient was having some post nasal drip but that has completley resolved now. Will continue to monitor. Will see the pcp for follow up in December.   Provided education to patient re: stroke prevention, s/s of heart attack and  stroke, DASH diet, complications of uncontrolled blood pressure Reviewed medications with patient and discussed importance of compliance. 02-20-2021: The patient states compliance with medications. No acute issues noted.  Discussed plans with patient for ongoing care management follow up and provided patient with direct contact information for care management team Advised patient, providing education and rationale, to monitor blood pressure daily and record, calling PCP for findings outside established parameters.  Review of upcoming appointments: pcp on 06-15-2021 Patient Goals: - blood pressure trends reviewed - depression screen reviewed - home or ambulatory blood pressure monitoring encouraged Self-Care Activities: - Self administers medications as prescribed Attends all scheduled provider appointments Calls provider office for new concerns, questions, or BP outside discussed parameters Checks BP and records as discussed Follows a low sodium diet/DASH diet Follow Up Plan: Telephone follow up appointment with care management team member scheduled for: 04-22-2021 at 11:45 am    Task: RNCM: Identify and Monitor Blood Pressure Elevation Completed 02/20/2021  Outcome: Positive  Note:   Care Management Activities:    - blood pressure trends reviewed - depression screen reviewed - home or ambulatory blood pressure monitoring encouraged        Care Plan :  RNCM: Osteoarthritis (Adult)- Recent flare up in left hand  Updates made by Vanita Ingles, RN since 02/20/2021 12:00 AM  Completed 02/20/2021   Problem: RNCM: Pain (Osteoarthritis)- left hand flare up Resolved 02/20/2021  Priority: Medium     Long-Range Goal: RNCM: Manage Pain- left hand Completed 02/20/2021  Priority: Medium  Note:   Current Barriers: Closing this goal. Patient is pain free at this time. Denies any new flare ups. Will continue to monitor.  Knowledge Deficits related to managing acute/chronic pain Non-adherence to  scheduled provider appointments Non-adherence to prescribed medication regimen Difficulty obtaining medications Chronic Disease Management support and education needs related to chronic pain Unable to independently manage osteoarthritis pain in left hand Does not maintain contact with provider office Nurse Case Manager Clinical Goal(s):  patient will verbalize understanding of plan for managing pain patient will work  with RN Care Manager to address any new concerns or needs related to pain in her left hand that is unresolved by current treatment regimen patient will attend all scheduled medical appointments: next with pcp on 12-22-2020 patient will demonstrate use of different relaxation  skills and/or diversional activities to assist with pain reduction (distraction, imagery, relaxation, massage, acupressure, TENS, heat, and cold application patient will report pain at a level less than 3 to 4 on a 10-10 rating scale. Denies any pain today patient will use pharmacological and nonpharmacological pain relief strategies patient will verbalize acceptable level of pain relief and ability to engage in desired activities patient will engage in desired activities without an increase in pain level Interventions:  Collaboration with [name], DO regarding development and update of comprehensive plan of care as evidenced by provider attestation and co-signature Inter-disciplinary care team collaboration (see longitudinal plan of care) - careful application of heat or ice encouraged - deep breathing, relaxation and mindfulness use promoted - effectiveness of pharmacologic therapy monitored - medication-induced side effects managed - misuse of pain medication assessed - motivation and barriers to change assessed and addressed - mutually acceptable comfort goal set - pain assessed - pain treatment goals reviewed - premedication prior to activity encouraged Evaluation of current treatment plan  related to arthritis pain to left hand  and patient's adherence to plan as established by provider. Advised patient to **call the office for changes in level or intensity of pain, concerns or questions.  Provided education to patient re: icing hand, elevation, use of topical cream, use of brace, and calling the provider for changes in level or intensity of pain and discomfort.  Reviewed medications with patient and discussed the patient is using a topical diclofenac on her left hand and using a hand brace that was recommended by the specialist. She states she can tell a big difference in the pain level in her hand. Denies any pain today.  Discussed plans with patient for ongoing care management follow up and provided patient with direct contact information for care management team Allow patient to maintain a diary of pain ratings, timing, precipitating events, medications, treatments, and what works best to relieve pain,  Refer to support groups and self-help groups Educate patient about the use of pharmacological interventions for pain management- antianxiety, antidepressants, NSAIDS, opioid analgesics,  Explain the importance of lifestyle modifications to effective pain management  Patient Goals/Self Care Activities:  - complementary therapy use encouraged - healthy lifestyle promoted - medication side effects managed - multimodal pain management plan developed - pain assessed - response to pain management plan monitored - response to pharmacologic  therapy monitored - support and encouragement provided Self-administers medications as prescribed Attends all scheduled provider appointments Calls pharmacy for medication refills Calls provider office for new concerns or questions Follow Up Plan: Telephone follow up appointment with care management team member scheduled for: 02-20-2021 at 1145 am      Task: RNCM: Management of pain in left hand OA Completed 02/20/2021  Outcome: Positive  Note:    Care Management Activities:    - complementary therapy use encouraged - healthy lifestyle promoted - medication side effects managed - multimodal pain management plan developed - pain assessed - response to pain management plan monitored - response to pharmacologic therapy monitored - support and encouragement provided        Care Plan : RNCM: Obesity (Adult)  Updates made by Vanita Ingles, RN since 02/20/2021 12:00 AM     Problem: RNCM: Readiness for Weight Management   Priority: High  Onset Date: 02/20/2021     Long-Range Goal: RNCM: Plan for Weight Management Developed   Start Date: 02/20/2021  Expected End Date: 02/20/2022  This Visit's Progress: On track  Priority: High  Note:   Current Barriers:  Knowledge Deficits related to weight loss strategies as evidenced by weight gain reported/observed and/or altered eating habits, response or side effect of medical treatment or medication , unidentified cause, patient stated overeating at meals, patient stated frequent snacking, and unrealistic goal setting Chronic Disease Management support and education needs related to weight management in patient with HTN, HLD, and Obesity with BMI >40 Unable to independently manage weight  Knowledge Deficits related to dietary restrictions that prevent patient from losing weight, limited mobility due to pain in joints  Nurse Case Manager Clinical Goal(s):  patient will verbalize basic understanding of plan for initiation of weight loss strategies patient will demonstrate improved health management independence as evidenced by adhering to provider prescribed dietary guidelines (specify), increasing daily water intake (patient does not have provider prescribed fluid restriction), and verbalizing understanding of specific eating plan patient will verbalize understanding of plan for effective management of healthy weight loss patient will work with [facility] and pcp to address needs related to  barriers to weight loss patient will attend all scheduled medical appointments: 06-15-2021 the patient will demonstrate ongoing self health care management ability Interventions:  Collaboration with [name], DO regarding development and update of comprehensive plan of care as evidenced by provider attestation and co-signature Inter-disciplinary care team collaboration (see longitudinal plan of care) Evaluation of current treatment plan related to weight loss and patient's adherence to plan as established by provider. 02-20-2021: Since seeing the pcp in June the patient has lost 8 pounds. Medical record updated. Praised the patient for progress. The patient has cut portions, is pushing back from the table. She eats red meat one time a week and likes chicken. She does not care for seafood. The patient states that she has cut back on sweets and breads. States she is happy with the weight loss but it is slow. Education on slow and steady wins the race. Will continue to monitor.  Provided verbal and/or written education to patient re: recommended life style changes: avoid fad diets, make small/incremental dietary and exercise changes, eat at the table and avoid eating in front of the TV, plan management of cravings, monitor snacking and cravings in food diary Discussed plans with patient for ongoing care management follow up and provided patient with direct contact information for care management team Reviewed scheduled/upcoming provider appointments including:  Patient  Goals/Self-Care Activities patient will:   - Patient will self administer medications as prescribed Patient will attend all scheduled provider appointments Patient will call pharmacy for medication refills Patient will attend church or other social activities Patient will continue to perform ADL's independently Patient will continue to perform IADL's independently Patient will call provider office for new concerns or  questions Patient will work with BSW to address care coordination needs and will continue to work with the clinical team to address health care and disease management related needs.   - activation or motivation to change monitored - benefits of weight loss explored - difficulty of making life-long changes acknowledged - encouragement and support provided - mental health treatment provided - mutual realistic weight loss goal set - patient perspective assessed - set-back potential acknowledged - weight-related stigma acknowledged Follow Up Plan: Telephone follow up appointment with care management team member scheduled for: 04-22-2021 at 1145    Task: Facilitate Readiness for Weight Loss Completed 02/20/2021  Outcome: Positive  Note:   Care Management Activities:    - activation or motivation to change monitored - benefits of weight loss explored - difficulty of making life-long changes acknowledged - encouragement and support provided - mental health treatment provided - mutual realistic weight loss goal set - patient perspective assessed - set-back potential acknowledged - weight-related stigma acknowledged         Plan:Telephone follow up appointment with care management team member scheduled for:  04-22-2021 at [place] am  [place], MSN, [facility] Mobile: [phone]

## 2021-02-20 NOTE — Telephone Encounter (Signed)
  Chronic Care Management   Note  02/20/2021 Name: Heather James MRN: [id number] DOB: 15-Aug-1950  Telephone encounter opened by mistake. Call completed. See new encounter for information.   Follow up plan: Telephone follow up appointment with care management team member scheduled for:04-22-2021 at 1145 am  [place], MSN, [facility] Mobile: [phone]

## 2021-03-10 ENCOUNTER — Other Ambulatory Visit: Payer: Self-pay | Admitting: Family Medicine

## 2021-04-22 ENCOUNTER — Ambulatory Visit (INDEPENDENT_AMBULATORY_CARE_PROVIDER_SITE_OTHER): Payer: PPO

## 2021-04-22 ENCOUNTER — Telehealth: Payer: PPO | Admitting: General Practice

## 2021-04-22 DIAGNOSIS — M19042 Primary osteoarthritis, left hand: Secondary | ICD-10-CM

## 2021-04-22 DIAGNOSIS — E7800 Pure hypercholesterolemia, unspecified: Secondary | ICD-10-CM

## 2021-04-22 DIAGNOSIS — I10 Essential (primary) hypertension: Secondary | ICD-10-CM

## 2021-04-22 NOTE — Patient Instructions (Signed)
Visit Information  PATIENT GOALS:  Goals Addressed             This Visit's Progress    RNCM: Develop a Weight Loss Readiness Plan       Timeframe:  Long-Range Goal Priority:  High Start Date:     02-20-2021                        Expected End Date:  02-20-2022                     Follow Up Date 06/24/2021    - get rid of junk food - identify pros and cons of weight loss - identify what might get in the way of success - list ways to deal with barriers to success - stock up on healthy food choices - track feelings, emotional and physical, when eating - track current amount and type of exercise - track what and where I am eating - track why I am eating    Why is this important?   Losing weight requires time to prepare your mind and your home.  Identify your eating habits and the emotions attached to eating.  Think about ways to be successful.  When you are not ready, you could lose motivation and give up on your plan.     Notes: 02-20-2021: Since coming to the pcp in June the patient states she has lost 8 pounds. Praised the patient for weight loss. She is cutting back on portions and stepping back from the table. She only eats red meats one time a week and has cut the sweets and breads. She does not like fish but eats a lot of chicken. Has never been a fan of salt. She feels like the slow weight loss with be effective and she can keep it off. Will continue to monitor. 04-22-2021: The patient states she is doing well. Denies any new concerns at this time.         Patient verbalizes understanding of instructions provided today and agrees to view in [place].   Telephone follow up appointment with care management team member scheduled for: 06-24-2021 at 12 am  [name] RN, MSN, [facility] Mobile: [phone]

## 2021-04-22 NOTE — Chronic Care Management (AMB) (Signed)
Chronic Care Management   CCM RN Visit Note  04/22/2021 Name: Heather James MRN: [id number] DOB: [birth year]  Subjective: Heather James is a 70 y.o. year old female who is a primary care patient of [name], DO. The care management team was consulted for assistance with disease management and care coordination needs.    Engaged with patient by telephone for follow up visit in response to provider referral for case management and/or care coordination services.   Consent to Services:  The patient was given information about Chronic Care Management services, agreed to services, and gave verbal consent prior to initiation of services.  Please see initial visit note for detailed documentation.   Patient agreed to services and verbal consent obtained.   Assessment: Review of patient past medical history, allergies, medications, health status, including review of consultants reports, laboratory and other test data, was performed as part of comprehensive evaluation and provision of chronic care management services.   SDOH (Social Determinants of Health) assessments and interventions performed:    CCM Care Plan  No Known Allergies  Outpatient Encounter Medications as of 04/22/2021  Medication Sig   albuterol (VENTOLIN HFA) 108 (90 Base) MCG/ACT inhaler Inhale 2 puffs into the lungs every 6 (six) hours as needed for wheezing or shortness of breath.   aspirin 81 MG tablet Take 81 mg by mouth daily.   atorvastatin (LIPITOR) 20 MG tablet TAKE 1 TABLET(20 MG) BY MOUTH DAILY   calcium carbonate (OS-CAL - DOSED IN MG OF ELEMENTAL CALCIUM) 1250 (500 Ca) MG tablet Take 1 tablet by mouth.   calcium-vitamin D (OSCAL WITH D) 500-200 MG-UNIT tablet Take 1 tablet by mouth.   diclofenac sodium (VOLTAREN) 1 % GEL Apply 4 g topically 4 (four) times daily.   hydrochlorothiazide (HYDRODIURIL) 50 MG tablet Take 1 tablet (50 mg total) by mouth every other day.   meloxicam (MOBIC) 7.5 MG tablet  Take 1 tablet (7.5 mg total) by mouth daily as needed.   mometasone (NASONEX) 50 MCG/ACT nasal spray Place 2 sprays into the nose daily.   potassium chloride SA (K-DUR,KLOR-CON) 20 MEQ tablet TK 1 T PO QD WF   raloxifene (EVISTA) 60 MG tablet Take 1 tablet (60 mg total) by mouth daily.   No facility-administered encounter medications on file as of 04/22/2021.    Patient Active Problem List   Diagnosis Date Noted   BMI 40.0-44.9, adult ([place]) 06/05/2018   Hypercholesteremia 06/05/2018   Advanced care planning/counseling discussion 05/30/2017   Tendonitis of foot 05/24/2016   Hypertension 05/23/2015   CSF leak from ear 05/13/2015   Osteopenia 05/13/2015   Morbid obesity ([place]) 05/13/2015    Conditions to be addressed/monitored:HTN, HLD, Osteoarthritis, and Obesity  Care Plan : RNCM: HLD  Updates made by Vanita Ingles, RN since 04/22/2021 12:00 AM  Completed 04/22/2021   Problem: RNCM: HLD Management Resolved 04/22/2021  Priority: Medium     Long-Range Goal: RNCM: HLD management Completed 04/22/2021  Start Date: 08/22/2020  Expected End Date: 08/22/2021  Recent Progress: On track  Priority: Medium  Note:   Current Barriers: Resolving due to duplicate goal Poorly controlled hyperlipidemia, complicated by HTN Current antihyperlipidemic regimen: Lipitor 20 mg QD Most recent lipid panel:     Component Value Date/Time   CHOL 154 12/22/2020 0809   TRIG 76 12/22/2020 0809   HDL 66 12/22/2020 0809   CHOLHDL 4.0 06/03/2017 0828   LDLCALC 73 12/22/2020 0809   ASCVD risk enhancing conditions: age >60, HTN Lacks  social connections Does not contact provider office for questions/concerns  RN Care Manager Clinical Goal(s):  patient will work with Consulting civil engineer, providers, and care team towards execution of optimized self-health management plan patient will verbalize understanding of plan for effective management of HLD patient will work with [facility] and pcp  to address needs related  to effective management of HLD Review of upcoming appointments- appointment with pcp on 06-15-2021  Interventions: Collaboration with [name], DO regarding development and update of comprehensive plan of care as evidenced by provider attestation and co-signature Inter-disciplinary care team collaboration (see longitudinal plan of care) Medication review performed; medication list updated in electronic medical record. 02-20-2021: Is compliant with medications, no new concerns at this time Inter-disciplinary care team collaboration (see longitudinal plan of care) Referred to pharmacy team for assistance with HLD medication management Evaluation of current treatment plan related to HLD  and patient's adherence to plan as established by provider. 02-20-2021: Denies any new concerns with her HLD. States she is doing well. Eating well, sleeping well and following dietary restrictions. Will continue to monitor. Is actively losing weight Advised patient to call the office for changes or questions Discussed plans with patient for ongoing care management follow up and provided patient with direct contact information for care management team   Patient Goals/Self-Care Activities:  patient will:   - call for medicine refill 2 or 3 days before it runs out - call if I am sick and can't take my medicine - keep a list of all the medicines I take; vitamins and herbals too - learn to read medicine labels - use a pillbox to sort medicine - use an alarm clock or phone to remind me to take my medicine - change to whole grain breads, cereal, pasta - drink 6 to 8 glasses of water each day - eat 3 to 5 servings of fruits and vegetables each day - eat 5 or 6 small meals each day - fill half the plate with nonstarchy vegetables - limit fast food meals to no more than 1 per week - manage portion size - prepare main meal at home 3 to 5 days each week - read food labels for fat, fiber, carbohydrates and  portion size - be open to making changes - I can manage, know and watch for signs of a heart attack - if I have chest pain, call for help - learn about small changes that will make a big difference - learn my personal risk factors  - barriers to meeting goals identified - change-talk evoked - choices provided - collaboration with team encouraged - decision-making supported - health risks reviewed - problem-solving facilitated - questions answered - readiness for change evaluated - reassurance provided - self-reflection promoted - self-reliance encouraged  Follow Up Plan: Telephone follow up appointment with care management team member scheduled for:04-22-2021 at 11:45 am      Care Plan : RNCM: Hypertension (Adult)  Updates made by Vanita Ingles, RN since 04/22/2021 12:00 AM  Completed 04/22/2021   Problem: RNCM: Hypertension (Hypertension) Resolved 04/22/2021  Priority: Medium     Long-Range Goal: RNCM: Hypertension Monitored Completed 04/22/2021  Start Date: 08/22/2020  Expected End Date: 08/22/2021  Recent Progress: On track  Priority: Medium  Note:   Objective: Resolving due to duplicate goal Last practice recorded BP readings:  BP Readings from Last 3 Encounters:  12/22/20 126/78  06/30/20 132/84  12/10/19 120/80    Most recent eGFR/CrCl: No results found for: EGFR  No components found for: CRCL Current Barriers:  Knowledge Deficits related to basic understanding of hypertension pathophysiology and self care management Knowledge Deficits related to understanding of medications prescribed for management of hypertension Limited Social Support Lacks social connections Does not contact provider office for questions/concerns Case Manager Clinical Goal(s):   patient will verbalize understanding of plan for hypertension management  patient will demonstrate improved adherence to prescribed treatment plan for hypertension as evidenced by taking all medications as  prescribed, monitoring and recording blood pressure as directed, adhering to low sodium/DASH diet  patient will demonstrate improved health management independence as evidenced by checking blood pressure as directed and notifying PCP if SBP>160 or DBP > 90, taking all medications as prescribe, and adhering to a low sodium diet as discussed.  patient will verbalize basic understanding of hypertension disease process and self health management plan as evidenced by compliance with medications, compliance with heart healthy diet and working with the CCM team to optimize health and well being  Interventions:  Collaboration with [name], DO regarding development and update of comprehensive plan of care as evidenced by provider attestation and co-signature Inter-disciplinary care team collaboration (see longitudinal plan of care) Evaluation of current treatment plan related to hypertension self management and patient's adherence to plan as established by provider. 02-20-2021: The patient states she is doing well. Denies any issues with her blood pressures at this time. Denies headaches, light headedness or dizziness. The patient was having some post nasal drip but that has completley resolved now. Will continue to monitor. Will see the pcp for follow up in December.   Provided education to patient re: stroke prevention, s/s of heart attack and stroke, DASH diet, complications of uncontrolled blood pressure Reviewed medications with patient and discussed importance of compliance. 02-20-2021: The patient states compliance with medications. No acute issues noted.  Discussed plans with patient for ongoing care management follow up and provided patient with direct contact information for care management team Advised patient, providing education and rationale, to monitor blood pressure daily and record, calling PCP for findings outside established parameters.  Review of upcoming appointments: pcp on  06-15-2021 Patient Goals: - blood pressure trends reviewed - depression screen reviewed - home or ambulatory blood pressure monitoring encouraged Self-Care Activities: - Self administers medications as prescribed Attends all scheduled provider appointments Calls provider office for new concerns, questions, or BP outside discussed parameters Checks BP and records as discussed Follows a low sodium diet/DASH diet Follow Up Plan: Telephone follow up appointment with care management team member scheduled for: 04-22-2021 at 11:45 am    Care Plan : RNCM: Obesity (Adult)  Updates made by Vanita Ingles, RN since 04/22/2021 12:00 AM  Completed 04/22/2021   Problem: RNCM: Readiness for Weight Management Resolved 04/22/2021  Priority: High  Onset Date: 02/20/2021     Long-Range Goal: RNCM: Plan for Weight Management Developed Completed 04/22/2021  Start Date: 02/20/2021  Expected End Date: 02/20/2022  Recent Progress: On track  Priority: High  Note:   Current Barriers: Resolving due to duplicate goal Knowledge Deficits related to weight loss strategies as evidenced by weight gain reported/observed and/or altered eating habits, response or side effect of medical treatment or medication , unidentified cause, patient stated overeating at meals, patient stated frequent snacking, and unrealistic goal setting Chronic Disease Management support and education needs related to weight management in patient with HTN, HLD, and Obesity with BMI >40 Unable to independently manage weight  Knowledge Deficits related to dietary restrictions  that prevent patient from losing weight, limited mobility due to pain in joints  Nurse Case Manager Clinical Goal(s):  patient will verbalize basic understanding of plan for initiation of weight loss strategies patient will demonstrate improved health management independence as evidenced by adhering to provider prescribed dietary guidelines (specify), increasing daily water  intake (patient does not have provider prescribed fluid restriction), and verbalizing understanding of specific eating plan patient will verbalize understanding of plan for effective management of healthy weight loss patient will work with [facility] and pcp to address needs related to barriers to weight loss patient will attend all scheduled medical appointments: 06-15-2021 the patient will demonstrate ongoing self health care management ability Interventions:  Collaboration with [name], DO regarding development and update of comprehensive plan of care as evidenced by provider attestation and co-signature Inter-disciplinary care team collaboration (see longitudinal plan of care) Evaluation of current treatment plan related to weight loss and patient's adherence to plan as established by provider. 02-20-2021: Since seeing the pcp in June the patient has lost 8 pounds. Medical record updated. Praised the patient for progress. The patient has cut portions, is pushing back from the table. She eats red meat one time a week and likes chicken. She does not care for seafood. The patient states that she has cut back on sweets and breads. States she is happy with the weight loss but it is slow. Education on slow and steady wins the race. Will continue to monitor.  Provided verbal and/or written education to patient re: recommended life style changes: avoid fad diets, make small/incremental dietary and exercise changes, eat at the table and avoid eating in front of the TV, plan management of cravings, monitor snacking and cravings in food diary Discussed plans with patient for ongoing care management follow up and provided patient with direct contact information for care management team Reviewed scheduled/upcoming provider appointments including:  Patient Goals/Self-Care Activities patient will:   - Patient will self administer medications as prescribed Patient will attend all scheduled provider  appointments Patient will call pharmacy for medication refills Patient will attend church or other social activities Patient will continue to perform ADL's independently Patient will continue to perform IADL's independently Patient will call provider office for new concerns or questions Patient will work with BSW to address care coordination needs and will continue to work with the clinical team to address health care and disease management related needs.   - activation or motivation to change monitored - benefits of weight loss explored - difficulty of making life-long changes acknowledged - encouragement and support provided - mental health treatment provided - mutual realistic weight loss goal set - patient perspective assessed - set-back potential acknowledged - weight-related stigma acknowledged Follow Up Plan: Telephone follow up appointment with care management team member scheduled for: 04-22-2021 at 1145    Care Plan : RNCM: General Plan of Care (Adult) for Chronic Disease Management and Care Coordination Needs  Updates made by Vanita Ingles, RN since 04/22/2021 12:00 AM     Problem: RNCM: Development of Plan of Care for Chronic Disease Management and Care Coordination Needs ( HTN, HLD, OA, Obesity)   Priority: High     Long-Range Goal: RNCM: Effective Management  of Plan of Care for Chronic Disease Management and Care Coordination Needs ( HTN, HLD, OA, Obesity)   Start Date: 04/22/2021  Expected End Date: 04/22/2022  Priority: High  Note:   Current Barriers:  Knowledge Deficits related to plan of care for management of  HTN, HLD, and obesity and OA  Chronic Disease Management support and education needs related to HTN, HLD, and obesity and OA  RNCM Clinical Goal(s):  Patient will verbalize understanding of plan for management of HTN, HLD, Osteoarthritis, and Obesity   verbalize basic understanding of HTN, HLD, and Osteoporosis disease process and self health management  plan Obesity  take all medications exactly as prescribed and will call provider for medication related questions demonstrate understanding of rationale for each prescribed medication   attend all scheduled medical appointments: 06-15-2021 at 0820 am demonstrate improved and ongoing adherence to prescribed treatment plan for HTN, HLD, Osteoporosis, and Obesity as evidenced by adherence to prescribed medication regimen contacting provider for new or worsened symptoms or questions   demonstrate a decrease in HTN, HLD, Osteoarthritis, and Obesity exacerbations   demonstrate ongoing self health care management ability for effective management of chronic conditions   through collaboration with RN Care manager, provider, and care team.   Interventions: 1:1 collaboration with primary care provider regarding development and update of comprehensive plan of care as evidenced by provider attestation and co-signature Inter-disciplinary care team collaboration (see longitudinal plan of care) Evaluation of current treatment plan related to  self management and patient's adherence to plan as established by provider   SDOH Barriers (Status: Goal on track: YES.) Long Term Goal (>30 days) Patient interviewed and SDOH assessment performed        Patient interviewed and appropriate assessments performed Provided patient with information about resources available in [facility] and care guides to help with any questions and concerns or changes in SDOH Discussed plans with patient for ongoing care management follow up and provided patient with direct contact information for care management team Advised patient to call the office for changes in SDOH, Questions, or concerns    Hyperlipidemia:  (Status: Goal on track: YES.) Lab Results  Component Value Date   CHOL 154 12/22/2020   HDL 66 12/22/2020   LDLCALC 73 12/22/2020   TRIG 76 12/22/2020   CHOLHDL 4.0 06/03/2017     Medication review performed;  medication list updated in electronic medical record.  Provider established cholesterol goals reviewed; Counseled on importance of regular laboratory monitoring as prescribed; Provided HLD educational materials; Reviewed role and benefits of statin for ASCVD risk reduction; Discussed strategies to manage statin-induced myalgias; Reviewed importance of limiting foods high in cholesterol; Screening for signs and symptoms of depression related to chronic disease state;  Assessed social determinant of health barriers;   Hypertension: (Status: Goal on track: YES.) Last practice recorded BP readings:  BP Readings from Last 3 Encounters:  12/22/20 126/78  06/30/20 132/84  12/10/19 120/80  Most recent eGFR/CrCl:  Lab Results  Component Value Date   EGFR 57 (L) 12/22/2020    No components found for: CRCL  Evaluation of current treatment plan related to hypertension self management and patient's adherence to plan as established by provider;   Provided education to patient re: stroke prevention, s/s of heart attack and stroke; Reviewed prescribed diet heart healthy Reviewed medications with patient and discussed importance of compliance;  Discussed plans with patient for ongoing care management follow up and provided patient with direct contact information for care management team; Advised patient, providing education and rationale, to monitor blood pressure daily and record, calling PCP for findings outside established parameters;  Reviewed scheduled/upcoming provider appointments including:  Provided education on prescribed diet heart healthy;  Discussed complications of poorly controlled blood pressure such as heart disease, stroke, circulatory  complications, vision complications, kidney impairment, sexual dysfunction;   Pain:  (Status: Goal on track: YES.) Pain assessment performed Medications reviewed Reviewed provider established plan for pain management; Discussed importance of  adherence to all scheduled medical appointments; Counseled on the importance of reporting any/all new or changed pain symptoms or management strategies to pain management provider; Advised patient to report to care team affect of pain on daily activities; Discussed use of relaxation techniques and/or diversional activities to assist with pain reduction (distraction, imagery, relaxation, massage, acupressure, TENS, heat, and cold application; Reviewed with patient prescribed pharmacological and nonpharmacological pain relief strategies;   Weight Loss:  (Status: Goal on track: YES.) Advised patient to discuss with primary care provider options regarding weight management;  Collaboration with provider for dietician referral;  Provided patient and/or caregiver with contact information about care guides Advice worker or dietician); Provided verbal and/or written education to patient re: provider recommended life style modifications;  Screening for signs and symptoms of depression;  Offered to connect patient with psychology or social work support for counseling and supportive care;  Reviewed recommended dietary changes: avoid fad diets, make small/incremental dietary and exercise changes, eat at the table and avoid eating in front of the TV, plan management of cravings, monitor snacking and cravings in food diary;  Patient Goals/Self-Care Activities: Patient will self administer medications as prescribed as evidenced by self report/primary caregiver report  Patient will attend all scheduled provider appointments as evidenced by clinician review of documented attendance to scheduled appointments and patient/caregiver report Patient will call pharmacy for medication refills as evidenced by patient report and review of pharmacy fill history as appropriate Patient will attend church or other social activities as evidenced by patient report Patient will continue to perform ADL's independently as  evidenced by patient/caregiver report Patient will continue to perform IADL's independently as evidenced by patient/caregiver report Patient will call provider office for new concerns or questions as evidenced by review of documented incoming telephone call notes and patient report Patient will work with BSW to address care coordination needs and will continue to work with the clinical team to address health care and disease management related needs as evidenced by documented adherence to scheduled care management/care coordination appointments - check blood pressure 3 times per week - check blood pressure daily - check blood pressure weekly - choose a place to take my blood pressure (home, clinic or office, retail store) - write blood pressure results in a log or diary - learn about high blood pressure - keep a blood pressure log - take blood pressure log to all doctor appointments - call doctor for signs and symptoms of high blood pressure - develop an action plan for high blood pressure - keep all doctor appointments - take medications for blood pressure exactly as prescribed - report new symptoms to your doctor - eat more whole grains, fruits and vegetables, lean meats and healthy fats - call for medicine refill 2 or 3 days before it runs out - take all medications exactly as prescribed - call doctor with any symptoms you believe are related to your medicine - call doctor when you experience any new symptoms - go to all doctor appointments as scheduled - adhere to prescribed diet: heart healthy diet        Plan:Telephone follow up appointment with care management team member scheduled for:  06-24-2021 at 22 am  [name] RN, MSN, [facility] Mobile: [phone]

## 2021-04-27 DIAGNOSIS — M19042 Primary osteoarthritis, left hand: Secondary | ICD-10-CM | POA: Diagnosis not present

## 2021-04-27 DIAGNOSIS — M19041 Primary osteoarthritis, right hand: Secondary | ICD-10-CM | POA: Diagnosis not present

## 2021-04-27 DIAGNOSIS — E7800 Pure hypercholesterolemia, unspecified: Secondary | ICD-10-CM | POA: Diagnosis not present

## 2021-04-27 DIAGNOSIS — I10 Essential (primary) hypertension: Secondary | ICD-10-CM | POA: Diagnosis not present

## 2021-05-28 DIAGNOSIS — L820 Inflamed seborrheic keratosis: Secondary | ICD-10-CM | POA: Diagnosis not present

## 2021-05-28 DIAGNOSIS — D2261 Melanocytic nevi of right upper limb, including shoulder: Secondary | ICD-10-CM | POA: Diagnosis not present

## 2021-05-28 DIAGNOSIS — Z85828 Personal history of other malignant neoplasm of skin: Secondary | ICD-10-CM | POA: Diagnosis not present

## 2021-05-28 DIAGNOSIS — D2271 Melanocytic nevi of right lower limb, including hip: Secondary | ICD-10-CM | POA: Diagnosis not present

## 2021-05-28 DIAGNOSIS — D2262 Melanocytic nevi of left upper limb, including shoulder: Secondary | ICD-10-CM | POA: Diagnosis not present

## 2021-06-10 ENCOUNTER — Ambulatory Visit: Payer: PPO

## 2021-06-11 ENCOUNTER — Ambulatory Visit (INDEPENDENT_AMBULATORY_CARE_PROVIDER_SITE_OTHER): Payer: PPO | Admitting: *Deleted

## 2021-06-11 DIAGNOSIS — Z0000 Encounter for general adult medical examination without abnormal findings: Secondary | ICD-10-CM | POA: Diagnosis not present

## 2021-06-11 NOTE — Progress Notes (Signed)
Subjective:   Heather James is a 70 y.o. female who presents for Medicare Annual (Subsequent) preventive examination.  I connected with  Heather James on 06/11/21 by a telephone enabled telemedicine application and verified that I am speaking with the correct person using two identifiers.   I discussed the limitations of evaluation and management by telemedicine. The patient expressed understanding and agreed to proceed.  Patient location: home  Provider location: [place] not in clinic    Review of Systems           Objective:    There were no vitals filed for this visit. There is no height or weight on file to calculate BMI.  Advanced Directives 06/09/2020 02/05/2019 06/01/2018 03/14/2018 05/27/2017 05/24/2016 05/23/2015  Does Patient Have a Medical Advance Directive? No No No Yes Yes Yes No  Type of Advance Directive - - - Clinical cytogeneticist of [employer] Power of [place];Living will -  Copy of [place] in Chart? - - - No - copy requested No - copy requested - -  Would patient like information on creating a medical advance directive? - No - Patient declined Yes (MAU/Ambulatory/Procedural Areas - Information given) - - - No - patient declined information    Current Medications (verified) Outpatient Encounter Medications as of 06/11/2021  Medication Sig   albuterol (VENTOLIN HFA) 108 (90 Base) MCG/ACT inhaler Inhale 2 puffs into the lungs every 6 (six) hours as needed for wheezing or shortness of breath.   aspirin 81 MG tablet Take 81 mg by mouth daily.   atorvastatin (LIPITOR) 20 MG tablet TAKE 1 TABLET(20 MG) BY MOUTH DAILY   calcium carbonate (OS-CAL - DOSED IN MG OF ELEMENTAL CALCIUM) 1250 (500 Ca) MG tablet Take 1 tablet by mouth.   calcium-vitamin D (OSCAL WITH D) 500-200 MG-UNIT tablet Take 1 tablet by mouth.   diclofenac sodium (VOLTAREN) 1 % GEL Apply 4 g topically 4 (four) times daily.    hydrochlorothiazide (HYDRODIURIL) 50 MG tablet Take 1 tablet (50 mg total) by mouth every other day.   meloxicam (MOBIC) 7.5 MG tablet Take 1 tablet (7.5 mg total) by mouth daily as needed.   mometasone (NASONEX) 50 MCG/ACT nasal spray Place 2 sprays into the nose daily.   potassium chloride SA (K-DUR,KLOR-CON) 20 MEQ tablet TK 1 T PO QD WF   raloxifene (EVISTA) 60 MG tablet Take 1 tablet (60 mg total) by mouth daily.   No facility-administered encounter medications on file as of 06/11/2021.    Allergies (verified) Patient has no known allergies.   History: Past Medical History:  Diagnosis Date   Cancer ([place])    skin ca on leg   CSF leak from ear    Hypercholesteremia    Hypertension    Obesity    Osteopenia    Past Surgical History:  Procedure Laterality Date   BREAST BIOPSY Left 11/21/96   neg   BREAST SURGERY Left    cyst removed   COLONOSCOPY WITH PROPOFOL N/A 03/14/2018   Procedure: COLONOSCOPY WITH PROPOFOL;  Surgeon: Toledo, Benay Pike, MD;  Location: [facility] ENDOSCOPY;  Service: Gastroenterology;  Laterality: N/A;   FACIAL NERVE SURGERY     PILONIDAL CYST EXCISION     SKIN CANCER EXCISION Right    right leg, April 2017   Family History  Problem Relation Age of Onset   Heart disease Mother    Diabetes Mother    Hypertension Mother  Breast cancer Mother 89   Cancer Mother    Heart disease Father    Diabetes Father    Hypertension Brother    Cancer Maternal Grandmother        spine   Heart disease Maternal Grandfather    Hearing loss Maternal Grandfather    Heart disease Paternal Grandmother    Emphysema Paternal Grandfather    Social History   Socioeconomic History   Marital status: Divorced    Spouse name: Not on file   Number of children: Not on file   Years of education: 12   Highest education level: 12th grade  Occupational History   Occupation: retired  Tobacco Use   Smoking status: Never   Smokeless tobacco: Never  Scientific laboratory technician Use:  Never used  Substance and Sexual Activity   Alcohol use: No    Alcohol/week: 0.0 standard drinks   Drug use: No   Sexual activity: Not Currently    Birth control/protection: None  Other Topics Concern   Not on file  Social History Narrative   Not on file   Social Determinants of Health   Financial Resource Strain: Low Risk    Difficulty of Paying Living Expenses: Not hard at all  Food Insecurity: No Food Insecurity   Worried About Charity fundraiser in the Last Year: Never true   [place] in the Last Year: Never true  Transportation Needs: No Transportation Needs   Lack of Transportation (Medical): No   Lack of Transportation (Non-Medical): No  Physical Activity: Inactive   Days of Exercise per Week: 0 days   Minutes of Exercise per Session: 0 min  Stress: No Stress Concern Present   Feeling of Stress : Not at all  Social Connections: Moderately Integrated   Frequency of Communication with Friends and Family: More than three times a week   Frequency of Social Gatherings with Friends and Family: More than three times a week   Attends Religious Services: More than 4 times per year   Active Member of [employer] or Organizations: No   Attends Archivist Meetings: Never   Marital Status: Married    Tobacco Counseling Counseling given: Not Answered   Clinical Intake:                 Diabetic?  no         Activities of Daily Living In your present state of health, do you have any difficulty performing the following activities: 06/30/2020  Hearing? Y  Vision? N  Difficulty concentrating or making decisions? N  Walking or climbing stairs? N  Dressing or bathing? N  Doing errands, shopping? N  Some recent data might be hidden    Patient Care Team: [name], DO as PCP - General (Family Medicine) Elsie Saas, MD as Consulting Physician (Orthopedic Surgery) Vanita Ingles, RN as Case Manager (General Practice)  Indicate any recent  Medical Services you may have received from other than [facility] providers in the past year (date may be approximate).     Assessment:   This is a routine wellness examination for Heather James.  Hearing/Vision screen No results found.  Dietary issues and exercise activities discussed:     Goals Addressed   None    Depression Screen PHQ 2/9 Scores 02/20/2021 06/30/2020 06/09/2020 08/24/2019 06/07/2019 06/01/2018 01/23/2018  PHQ - 2 Score 0 0 0 0 0 0 0  PHQ- 9 Score - - - - - - -  Fall Risk Fall Risk  06/09/2020 06/07/2019 12/07/2018 06/01/2018 01/23/2018  Falls in the past year? 0 0 0 0 No  Number falls in past yr: - 0 0 0 -  Injury with Fall? - 0 0 0 -  Risk for fall due to : Medication side effect - - - -  Follow up Falls evaluation completed;Education provided;Falls prevention discussed - Falls evaluation completed - -    FALL RISK PREVENTION PERTAINING TO THE HOME:  Any stairs in or around the home? No  If so, are there any without handrails? No  Home free of loose throw rugs in walkways, pet beds, electrical cords, etc? Yes  Adequate lighting in your home to reduce risk of falls? Yes   ASSISTIVE DEVICES UTILIZED TO PREVENT FALLS:  Life alert? No  Use of a cane, walker or w/c? No  Grab bars in the bathroom? Yes  Shower chair or bench in shower? No  Elevated toilet seat or a handicapped toilet? Yes   TIMED UP AND GO:  Was the test performed? No .    Cognitive Function:  Normal cognitive status assessed by direct observation by this Nurse Health Advisor. No abnormalities found.       6CIT Screen 06/09/2020 06/07/2019 06/01/2018 05/27/2017  What Year? 0 points 0 points 0 points 0 points  What month? 0 points 0 points 0 points 0 points  What time? 0 points 0 points 0 points 0 points  Count back from 20 0 points 0 points 0 points 0 points  Months in reverse 0 points 0 points 0 points 0 points  Repeat phrase 0 points 0 points 0 points 0 points  Total Score 0 0 0 0     Immunizations Immunization History  Administered Date(s) Administered   Influenza, High Dose Seasonal PF 04/12/2019, 04/02/2021   Influenza-Unspecified 03/29/2016, 04/06/2017, 03/28/2020   PFIZER(Purple Top)SARS-COV-2 Vaccination 08/03/2019, 08/24/2019, 06/02/2020   Pneumococcal Conjugate-13 05/23/2015   Pneumococcal Polysaccharide-23 05/24/2016   Tdap 04/03/2013   Zoster Recombinat (Shingrix) 02/24/2018, 05/17/2018, 08/11/2018   Zoster, Live 04/03/2013    TDAP status: Up to date  Flu Vaccine status: Up to date  Pneumococcal vaccine status: Up to date  Covid-19 vaccine status: Information provided on how to obtain vaccines.   Qualifies for Shingles Vaccine? No   Zostavax completed Yes   Shingrix Completed?: Yes  Screening Tests Health Maintenance  Topic Date Due   COVID-19 Vaccine (4 - Booster for Pfizer series) 07/28/2020   MAMMOGRAM  07/11/2022   COLONOSCOPY (Pts 45-33yrs Insurance coverage will need to be confirmed)  03/15/2023   TETANUS/TDAP  04/04/2023   DEXA SCAN  07/31/2023   Pneumonia Vaccine 33+ Years old  Completed   INFLUENZA VACCINE  Completed   Hepatitis C Screening  Completed   Zoster Vaccines- Shingrix  Completed   HPV VACCINES  Aged Out    Health Maintenance  Health Maintenance Due  Topic Date Due   COVID-19 Vaccine (4 - Booster for [place] series) 07/28/2020    Colorectal cancer screening: Type of screening: Colonoscopy. Completed 2019. Repeat every 5 years  Mammogram status: Completed  . Repeat every year  Bone Density status: Completed 2022. Results reflect: Bone density results: OSTEOPENIA. Repeat every 3 years.  Lung Cancer Screening: (Low Dose CT Chest recommended if Age 58-80 years, 30 pack-year currently smoking OR have quit w/in 15years.) does not qualify.   Lung Cancer Screening Referral:   Additional Screening:  Hepatitis C Screening: does not qualify; Completed 2017  Vision  Screening: Recommended annual ophthalmology exams  for early detection of glaucoma and other disorders of the eye. Is the patient up to date with their annual eye exam?  Yes  Who is the provider or what is the name of the office in which the patient attends annual eye exams? [facility] If pt is not established with a provider, would they like to be referred to a provider to establish care? No .   Dental Screening: Recommended annual dental exams for proper oral hygiene  Community Resource Referral / Chronic Care Management: CRR required this visit?  No   CCM required this visit?  No      Plan:     I have personally reviewed and noted the following in the patients chart:   Medical and social history Use of alcohol, tobacco or illicit drugs  Current medications and supplements including opioid prescriptions.  Functional ability and status Nutritional status Physical activity Advanced directives List of other physicians Hospitalizations, surgeries, and ER visits in previous 12 months Vitals Screenings to include cognitive, depression, and falls Referrals and appointments  In addition, I have reviewed and discussed with patient certain preventive protocols, quality metrics, and best practice recommendations. A written personalized care plan for preventive services as well as general preventive health recommendations were provided to patient.     Leroy Kennedy, LPN   [id number]   Nurse Notes:

## 2021-06-15 ENCOUNTER — Ambulatory Visit: Payer: PPO

## 2021-06-15 ENCOUNTER — Other Ambulatory Visit: Payer: Self-pay

## 2021-06-15 ENCOUNTER — Encounter: Payer: Self-pay | Admitting: Family Medicine

## 2021-06-15 ENCOUNTER — Ambulatory Visit (INDEPENDENT_AMBULATORY_CARE_PROVIDER_SITE_OTHER): Payer: PPO | Admitting: Family Medicine

## 2021-06-15 VITALS — BP 125/77 | HR 70 | Temp 98.8°F | Ht 62.0 in | Wt 259.2 lb

## 2021-06-15 DIAGNOSIS — I10 Essential (primary) hypertension: Secondary | ICD-10-CM

## 2021-06-15 DIAGNOSIS — E7800 Pure hypercholesterolemia, unspecified: Secondary | ICD-10-CM | POA: Diagnosis not present

## 2021-06-15 DIAGNOSIS — Z1231 Encounter for screening mammogram for malignant neoplasm of breast: Secondary | ICD-10-CM | POA: Diagnosis not present

## 2021-06-15 MED ORDER — ALBUTEROL SULFATE HFA 108 (90 BASE) MCG/ACT IN AERS: 2.0000 | INHALATION_SPRAY | Freq: Four times a day (QID) | RESPIRATORY_TRACT | 6 refills | Status: DC | PRN

## 2021-06-15 MED ORDER — ATORVASTATIN CALCIUM 20 MG PO TABS: ORAL_TABLET | ORAL | 1 refills | Status: DC

## 2021-06-15 MED ORDER — HYDROCHLOROTHIAZIDE 50 MG PO TABS: 50.0000 mg | ORAL_TABLET | ORAL | 1 refills | Status: DC

## 2021-06-15 MED ORDER — RALOXIFENE HCL 60 MG PO TABS: 60.0000 mg | ORAL_TABLET | Freq: Every day | ORAL | 3 refills | Status: DC

## 2021-06-15 MED ORDER — MELOXICAM 7.5 MG PO TABS: 7.5000 mg | ORAL_TABLET | Freq: Every day | ORAL | 1 refills | Status: DC | PRN

## 2021-06-15 NOTE — Assessment & Plan Note (Signed)
Under good control on current regimen. Continue current regimen. Continue to monitor. Call with any concerns. Refills given. Labs drawn today.   

## 2021-06-15 NOTE — Progress Notes (Signed)
BP 125/77    Pulse 70    Temp 98.8 F (37.1 C)    Ht 5' 2" (1.575 m)    Wt 259 lb 3.2 oz (117.6 kg)    LMP  (LMP Unknown)    SpO2 97%    BMI 47.41 kg/m    Subjective:    Patient ID: Heather James, female    DOB: 1951-02-07, 70 y.o.   MRN: [id number]  HPI: Heather James is a 70 y.o. female  Chief Complaint  Patient presents with   Hypertension   Hyperlipidemia   HYPERTENSION / [place] Satisfied with current treatment? yes Duration of hypertension: chronic BP monitoring frequency: not checking BP medication side effects: no Past BP meds: hctz Duration of hyperlipidemia: chronic Cholesterol medication side effects: no Cholesterol supplements: none Past cholesterol medications: simvastatin Medication compliance: excellent compliance Aspirin: no Recent stressors: no Recurrent headaches: no Visual changes: no Palpitations: no Dyspnea: no Chest pain: no Lower extremity edema: no Dizzy/lightheaded: no  Relevant past medical, surgical, family and social history reviewed and updated as indicated. Interim medical history since our last visit reviewed. Allergies and medications reviewed and updated.  Review of Systems  Constitutional: Negative.   Respiratory: Negative.    Cardiovascular: Negative.   Gastrointestinal: Negative.   Musculoskeletal: Negative.   Neurological: Negative.   Psychiatric/Behavioral: Negative.     Per HPI unless specifically indicated above     Objective:    BP 125/77    Pulse 70    Temp 98.8 F (37.1 C)    Ht 5' 2" (1.575 m)    Wt 259 lb 3.2 oz (117.6 kg)    LMP  (LMP Unknown)    SpO2 97%    BMI 47.41 kg/m   Wt Readings from Last 3 Encounters:  06/15/21 259 lb 3.2 oz (117.6 kg)  02/20/21 238 lb (108 kg)  12/22/20 240 lb (108.9 kg)    Physical Exam Vitals and nursing note reviewed.  Constitutional:      General: She is not in acute distress.    Appearance: Normal appearance. She is not ill-appearing, toxic-appearing or  diaphoretic.  HENT:     Head: Normocephalic and atraumatic.     Right Ear: External ear normal.     Left Ear: External ear normal.     Nose: Nose normal.     Mouth/Throat:     Mouth: Mucous membranes are moist.     Pharynx: Oropharynx is clear.  Eyes:     General: No scleral icterus.       Right eye: No discharge.        Left eye: No discharge.     Extraocular Movements: Extraocular movements intact.     Conjunctiva/sclera: Conjunctivae normal.     Pupils: Pupils are equal, round, and reactive to light.  Cardiovascular:     Rate and Rhythm: Normal rate and regular rhythm.     Pulses: Normal pulses.     Heart sounds: Normal heart sounds. No murmur heard.   No friction rub. No gallop.  Pulmonary:     Effort: Pulmonary effort is normal. No respiratory distress.     Breath sounds: Normal breath sounds. No stridor. No wheezing, rhonchi or rales.  Chest:     Chest wall: No tenderness.  Musculoskeletal:        General: Normal range of motion.     Cervical back: Normal range of motion and neck supple.  Skin:    General: Skin is warm  and dry.     Capillary Refill: Capillary refill takes less than 2 seconds.     Coloration: Skin is not jaundiced or pale.     Findings: No bruising, erythema, lesion or rash.  Neurological:     General: No focal deficit present.     Mental Status: She is alert and oriented to person, place, and time. Mental status is at baseline.  Psychiatric:        Mood and Affect: Mood normal.        Behavior: Behavior normal.        Thought Content: Thought content normal.        Judgment: Judgment normal.    Results for orders placed or performed in visit on 12/22/20  Comprehensive metabolic panel  Result Value Ref Range   Glucose 103 (H) 65 - 99 mg/dL   BUN 17 8 - 27 mg/dL   Creatinine, Ser 1.05 (H) 0.57 - 1.00 mg/dL   eGFR 57 (L) >59 mL/min/1.73   BUN/Creatinine Ratio 16 12 - 28   Sodium 143 134 - 144 mmol/L   Potassium 5.0 3.5 - 5.2 mmol/L   Chloride  100 96 - 106 mmol/L   CO2 25 20 - 29 mmol/L   Calcium 10.1 8.7 - 10.3 mg/dL   Total Protein 6.9 6.0 - 8.5 g/dL   Albumin 4.6 3.8 - 4.8 g/dL   Globulin, Total 2.3 1.5 - 4.5 g/dL   Albumin/Globulin Ratio 2.0 1.2 - 2.2   Bilirubin Total 0.9 0.0 - 1.2 mg/dL   Alkaline Phosphatase 134 (H) 44 - 121 IU/L   AST 26 0 - 40 IU/L   ALT 23 0 - 32 IU/L  Lipid Panel w/o Chol/HDL Ratio  Result Value Ref Range   Cholesterol, Total 154 100 - 199 mg/dL   Triglycerides 76 0 - 149 mg/dL   HDL 66 >39 mg/dL   VLDL Cholesterol Cal 15 5 - 40 mg/dL   LDL Chol Calc (NIH) 73 0 - 99 mg/dL      Assessment & Plan:   Problem List Items Addressed This Visit       Cardiovascular and Mediastinum   Hypertension - Primary    Under good control on current regimen. Continue current regimen. Continue to monitor. Call with any concerns. Refills given. Labs drawn today.        Relevant Medications   atorvastatin (LIPITOR) 20 MG tablet   hydrochlorothiazide (HYDRODIURIL) 50 MG tablet   Other Relevant Orders   Comprehensive metabolic panel     Other   Hypercholesteremia    Under good control on current regimen. Continue current regimen. Continue to monitor. Call with any concerns. Refills given. Labs drawn today.        Relevant Medications   atorvastatin (LIPITOR) 20 MG tablet   hydrochlorothiazide (HYDRODIURIL) 50 MG tablet   Other Relevant Orders   Comprehensive metabolic panel   Lipid Panel w/o Chol/HDL Ratio   Other Visit Diagnoses     Encounter for screening mammogram for malignant neoplasm of breast       Mammogram ordered today.   Relevant Orders   MM DIAG BREAST TOMO BILATERAL        Follow up plan: Return in about 6 months (around 12/14/2021), or physical.

## 2021-06-16 ENCOUNTER — Other Ambulatory Visit: Payer: Self-pay | Admitting: Family Medicine

## 2021-06-16 DIAGNOSIS — Z1231 Encounter for screening mammogram for malignant neoplasm of breast: Secondary | ICD-10-CM

## 2021-06-16 LAB — COMPREHENSIVE METABOLIC PANEL
ALT: 20 IU/L (ref 0–32)
AST: 23 IU/L (ref 0–40)
Albumin/Globulin Ratio: 2.1 (ref 1.2–2.2)
Albumin: 4.5 g/dL (ref 3.7–4.7)
Alkaline Phosphatase: 124 IU/L — ABNORMAL HIGH (ref 44–121)
BUN/Creatinine Ratio: 15 (ref 12–28)
BUN: 14 mg/dL (ref 8–27)
Bilirubin Total: 0.9 mg/dL (ref 0.0–1.2)
CO2: 25 mmol/L (ref 20–29)
Calcium: 9.8 mg/dL (ref 8.7–10.3)
Chloride: 103 mmol/L (ref 96–106)
Creatinine, Ser: 0.93 mg/dL (ref 0.57–1.00)
Globulin, Total: 2.1 g/dL (ref 1.5–4.5)
Glucose: 93 mg/dL (ref 70–99)
Potassium: 4.3 mmol/L (ref 3.5–5.2)
Sodium: 143 mmol/L (ref 134–144)
Total Protein: 6.6 g/dL (ref 6.0–8.5)
eGFR: 66 mL/min/{1.73_m2} (ref >59)

## 2021-06-16 LAB — LIPID PANEL W/O CHOL/HDL RATIO
Cholesterol, Total: 129 mg/dL (ref 100–199)
HDL: 60 mg/dL (ref >39)
LDL Chol Calc (NIH): 54 mg/dL (ref 0–99)
Triglycerides: 72 mg/dL (ref 0–149)
VLDL Cholesterol Cal: 15 mg/dL (ref 5–40)

## 2021-06-23 ENCOUNTER — Ambulatory Visit (INDEPENDENT_AMBULATORY_CARE_PROVIDER_SITE_OTHER): Payer: PPO | Admitting: Family Medicine

## 2021-06-23 ENCOUNTER — Other Ambulatory Visit: Payer: Self-pay

## 2021-06-23 ENCOUNTER — Encounter: Payer: Self-pay | Admitting: Family Medicine

## 2021-06-23 ENCOUNTER — Ambulatory Visit: Payer: Self-pay

## 2021-06-23 VITALS — BP 115/72 | HR 90 | Temp 99.4°F | Wt 239.6 lb

## 2021-06-23 DIAGNOSIS — R059 Cough, unspecified: Secondary | ICD-10-CM

## 2021-06-23 LAB — VERITOR FLU A/B WAIVED
Influenza A: NEGATIVE
Influenza B: NEGATIVE

## 2021-06-23 MED ORDER — BENZONATATE 200 MG PO CAPS: 200.0000 mg | ORAL_CAPSULE | Freq: Two times a day (BID) | ORAL | 0 refills | Status: DC | PRN

## 2021-06-23 MED ORDER — HYDROCOD POLST-CPM POLST ER 10-8 MG/5ML PO SUER: 5.0000 mL | Freq: Two times a day (BID) | ORAL | 0 refills | Status: DC | PRN

## 2021-06-23 MED ORDER — PREDNISONE 50 MG PO TABS: 50.0000 mg | ORAL_TABLET | Freq: Every day | ORAL | 0 refills | Status: DC

## 2021-06-23 NOTE — Telephone Encounter (Signed)
°  Chief Complaint: Dry cough Symptoms: Cough, runny nose Frequency: Started Friday Pertinent Negatives: Patient denies fever Disposition: [] ED /[] Urgent Care (no appt availability in office) / [] Appointment(In office/virtual)/ []  [place] [facility]/ [] Home Care/ [] Refused Recommended Disposition  Additional Notes: No availability until Thursday for virtual. Asking for cough "medicine so I can sleep at night." OTC medications not helping. "I actually feel better, I just need to sleep." Please advise pt.   Answer Assessment - Initial Assessment Questions 1. ONSET: "When did the cough begin?"      Friday 2. SEVERITY: "How bad is the cough today?"      Severe 3. SPUTUM: "Describe the color of your sputum" (none, dry cough; clear, white, yellow, green)     None 4. HEMOPTYSIS: "Are you coughing up any blood?" If so ask: "How much?" (flecks, streaks, tablespoons, etc.)     No 5. DIFFICULTY BREATHING: "Are you having difficulty breathing?" If Yes, ask: "How bad is it?" (e.g., mild, moderate, severe)    - MILD: No SOB at rest, mild SOB with walking, speaks normally in sentences, can lie down, no retractions, pulse < 100.    - MODERATE: SOB at rest, SOB with minimal exertion and prefers to sit, cannot lie down flat, speaks in phrases, mild retractions, audible wheezing, pulse 100-120.    - SEVERE: Very SOB at rest, speaks in single words, struggling to breathe, sitting hunched forward, retractions, pulse > 120      No 6. FEVER: "Do you have a fever?" If Yes, ask: "What is your temperature, how was it measured, and when did it start?"     No 7. CARDIAC HISTORY: "Do you have any history of heart disease?" (e.g., heart attack, congestive heart failure)      No 8. LUNG HISTORY: "Do you have any history of lung disease?"  (e.g., pulmonary embolus, asthma, emphysema)     Has an inhaler 9. PE RISK FACTORS: "Do you have a history of blood clots?" (or: recent major surgery, recent prolonged travel,  bedridden)     No 10. OTHER SYMPTOMS: "Do you have any other symptoms?" (e.g., runny nose, wheezing, chest pain)       Runny nose 11. PREGNANCY: "Is there any chance you are pregnant?" "When was your last menstrual period?"       No 12. TRAVEL: "Have you traveled out of the country in the last month?" (e.g., travel history, exposures)       No  Protocols used: Cough - Acute Non-Productive-A-AH

## 2021-06-23 NOTE — Progress Notes (Signed)
BP 115/72    Pulse 90    Temp 99.4 F (37.4 C) (Oral)    Wt 239 lb 9.6 oz (108.7 kg)    LMP  (LMP Unknown)    SpO2 97%    BMI 43.82 kg/m    Subjective:    Patient ID: Heather James, female    DOB: 06-11-1951, 70 y.o.   MRN: [id number]  HPI: Heather James is a 70 y.o. female  Chief Complaint  Patient presents with   URI    Pt states she has had some congestion, sinus pressure, wheezing, and a productive cough. States she has been taking mucinex, tylenol, albuterol, and Nyquil. States her symptoms started a week ago.    UPPER RESPIRATORY TRACT INFECTION- has tested negative 2x covid Duration: about a week Worst symptom: cough, congestion Fever: no Cough: yes Shortness of breath: no Wheezing: yes Chest pain: yes, with cough Chest tightness: no Chest congestion: no Nasal congestion: yes Runny nose: no Post nasal drip: yes Sneezing: no Sore throat: no Swollen glands: no Sinus pressure: yes Headache: no Face pain: no Toothache: no Ear pain: no  Ear pressure: no  Eyes red/itching:no Eye drainage/crusting: no  Vomiting: no Rash: no Fatigue: yes Sick contacts: no Strep contacts: no  Context: stable Recurrent sinusitis: no Relief with OTC cold/cough medications: no  Treatments attempted:  albuterol, cold/sinus, and mucinex   Relevant past medical, surgical, family and social history reviewed and updated as indicated. Interim medical history since our last visit reviewed. Allergies and medications reviewed and updated.  Review of Systems  Constitutional:  Positive for fatigue. Negative for activity change, appetite change, chills, diaphoresis, fever and unexpected weight change.  HENT:  Positive for congestion, postnasal drip, rhinorrhea and sinus pressure. Negative for dental problem, drooling, ear discharge, ear pain, facial swelling, hearing loss, mouth sores, nosebleeds, sinus pain, sneezing, sore throat, tinnitus, trouble swallowing and voice change.    Eyes: Negative.   Respiratory:  Positive for cough, shortness of breath and wheezing. Negative for apnea, choking, chest tightness and stridor.   Cardiovascular: Negative.   Musculoskeletal: Negative.   Psychiatric/Behavioral: Negative.     Per HPI unless specifically indicated above     Objective:    BP 115/72    Pulse 90    Temp 99.4 F (37.4 C) (Oral)    Wt 239 lb 9.6 oz (108.7 kg)    LMP  (LMP Unknown)    SpO2 97%    BMI 43.82 kg/m   Wt Readings from Last 3 Encounters:  06/23/21 239 lb 9.6 oz (108.7 kg)  06/15/21 259 lb 3.2 oz (117.6 kg)  02/20/21 238 lb (108 kg)    Physical Exam Vitals and nursing note reviewed.  Constitutional:      General: She is not in acute distress.    Appearance: Normal appearance. She is obese. She is not ill-appearing, toxic-appearing or diaphoretic.  HENT:     Head: Normocephalic and atraumatic.     Right Ear: External ear normal.     Left Ear: Tympanic membrane, ear canal and external ear normal.     Nose: Rhinorrhea present. No congestion.     Mouth/Throat:     Mouth: Mucous membranes are moist.     Pharynx: No oropharyngeal exudate or posterior oropharyngeal erythema.  Eyes:     General: No scleral icterus.       Right eye: No discharge.        Left eye: No discharge.  Extraocular Movements: Extraocular movements intact.     Conjunctiva/sclera: Conjunctivae normal.     Pupils: Pupils are equal, round, and reactive to light.  Cardiovascular:     Rate and Rhythm: Normal rate and regular rhythm.     Pulses: Normal pulses.     Heart sounds: Normal heart sounds. No murmur heard.   No friction rub. No gallop.  Pulmonary:     Effort: Pulmonary effort is normal. No respiratory distress.     Breath sounds: Normal breath sounds. No stridor. No wheezing, rhonchi or rales.  Chest:     Chest wall: No tenderness.  Musculoskeletal:        General: Normal range of motion.     Cervical back: Normal range of motion and neck supple.  Skin:     General: Skin is warm and dry.     Capillary Refill: Capillary refill takes less than 2 seconds.     Coloration: Skin is not jaundiced or pale.     Findings: No bruising, erythema, lesion or rash.  Neurological:     General: No focal deficit present.     Mental Status: She is alert and oriented to person, place, and time. Mental status is at baseline.  Psychiatric:        Mood and Affect: Mood normal.        Behavior: Behavior normal.        Thought Content: Thought content normal.        Judgment: Judgment normal.    Results for orders placed or performed in visit on 06/15/21  Comprehensive metabolic panel  Result Value Ref Range   Glucose 93 70 - 99 mg/dL   BUN 14 8 - 27 mg/dL   Creatinine, Ser 0.93 0.57 - 1.00 mg/dL   eGFR 66 >59 mL/min/1.73   BUN/Creatinine Ratio 15 12 - 28   Sodium 143 134 - 144 mmol/L   Potassium 4.3 3.5 - 5.2 mmol/L   Chloride 103 96 - 106 mmol/L   CO2 25 20 - 29 mmol/L   Calcium 9.8 8.7 - 10.3 mg/dL   Total Protein 6.6 6.0 - 8.5 g/dL   Albumin 4.5 3.7 - 4.7 g/dL   Globulin, Total 2.1 1.5 - 4.5 g/dL   Albumin/Globulin Ratio 2.1 1.2 - 2.2   Bilirubin Total 0.9 0.0 - 1.2 mg/dL   Alkaline Phosphatase 124 (H) 44 - 121 IU/L   AST 23 0 - 40 IU/L   ALT 20 0 - 32 IU/L  Lipid Panel w/o Chol/HDL Ratio  Result Value Ref Range   Cholesterol, Total 129 100 - 199 mg/dL   Triglycerides 72 0 - 149 mg/dL   HDL 60 >39 mg/dL   VLDL Cholesterol Cal 15 5 - 40 mg/dL   LDL Chol Calc (NIH) 54 0 - 99 mg/dL      Assessment & Plan:   Problem List Items Addressed This Visit   None Visit Diagnoses     Cough, unspecified type    -  Primary   Flu negative. Await COVID. Treat with prednisone, tussionex and tessalon. If not better by the end of the week will send abx. Call with any concerns.    Relevant Orders   Veritor Flu A/B Waived   Novel Coronavirus, NAA ([facility])        Follow up plan: Return if symptoms worsen or fail to improve.

## 2021-06-23 NOTE — Telephone Encounter (Signed)
Pt scheduled today with Dr. Johnson 

## 2021-06-24 ENCOUNTER — Ambulatory Visit (INDEPENDENT_AMBULATORY_CARE_PROVIDER_SITE_OTHER): Payer: PPO

## 2021-06-24 ENCOUNTER — Telehealth: Payer: PPO

## 2021-06-24 ENCOUNTER — Ambulatory Visit: Payer: Self-pay

## 2021-06-24 DIAGNOSIS — I10 Essential (primary) hypertension: Secondary | ICD-10-CM

## 2021-06-24 DIAGNOSIS — R059 Cough, unspecified: Secondary | ICD-10-CM

## 2021-06-24 DIAGNOSIS — E7800 Pure hypercholesterolemia, unspecified: Secondary | ICD-10-CM

## 2021-06-24 DIAGNOSIS — M19042 Primary osteoarthritis, left hand: Secondary | ICD-10-CM

## 2021-06-24 LAB — SARS-COV-2, NAA 2 DAY TAT

## 2021-06-24 LAB — NOVEL CORONAVIRUS, NAA: SARS-CoV-2, NAA: DETECTED — AB

## 2021-06-24 NOTE — Patient Instructions (Signed)
Visit Information  Thank you for taking time to visit with me today. Please don't hesitate to contact me if I can be of assistance to you before our next scheduled telephone appointment.  Following are the goals we discussed today:  RNCM Clinical Goal(s):  Patient will verbalize understanding of plan for management of HTN, HLD, Osteoarthritis, and Obesity   verbalize basic understanding of HTN, HLD, and Osteoporosis disease process and self health management plan Obesity  take all medications exactly as prescribed and will call provider for medication related questions demonstrate understanding of rationale for each prescribed medication  attend all scheduled medical appointments: 12-14-2021 demonstrate improved and ongoing adherence to prescribed treatment plan for HTN, HLD, Osteoporosis, and Obesity as evidenced by adherence to prescribed medication regimen contacting provider for new or worsened symptoms or questions  demonstrate a decrease in HTN, HLD, Osteoarthritis, and Obesity exacerbations  demonstrate ongoing self health care management ability for effective management of chronic conditions  through collaboration with RN Care manager, provider, and care team.    Interventions: 1:1 collaboration with primary care provider regarding development and update of comprehensive plan of care as evidenced by provider attestation and co-signature Inter-disciplinary care team collaboration (see longitudinal plan of care) Evaluation of current treatment plan related to  self management and patient's adherence to plan as established by provider     SDOH Barriers (Status: Goal on track: YES.) Long Term Goal (>30 days) Patient interviewed and SDOH assessment performed        Patient interviewed and appropriate assessments performed Provided patient with information about resources available in [facility] and care guides to help with any questions and concerns or changes in SDOH Discussed plans  with patient for ongoing care management follow up and provided patient with direct contact information for care management team Advised patient to call the office for changes in SDOH, Questions, or concerns       Hyperlipidemia:  (Status: Goal on track: YES.)      Lab Results  Component Value Date    CHOL 129 06/15/2021    HDL 60 06/15/2021    LDLCALC 54 06/15/2021    TRIG 72 06/15/2021    CHOLHDL 4.0 06/03/2017      Medication review performed; medication list updated in electronic medical record.  Provider established cholesterol goals reviewed; Counseled on importance of regular laboratory monitoring as prescribed. 06-24-2021: Review of recent lab work. Patient is at goal. Praised for good results; Provided HLD educational materials; Reviewed role and benefits of statin for ASCVD risk reduction; Discussed strategies to manage statin-induced myalgias; Reviewed importance of limiting foods high in cholesterol. 06-24-2021: Review and education provided Screening for signs and symptoms of depression related to chronic disease state;  Assessed social determinant of health barriers;    Hypertension: (Status: Goal on track: YES.) Last practice recorded BP readings:     BP Readings from Last 3 Encounters:  06/23/21 115/72  06/15/21 125/77  12/22/20 126/78  Most recent eGFR/CrCl:       Lab Results  Component Value Date    EGFR 66 06/15/2021    No components found for: CRCL   Evaluation of current treatment plan related to hypertension self management and patient's adherence to plan as established by provider. 06-24-2021: The patient is doing well with management of blood pressures. The patient states that the only issue she is having right now is an URI. PCP thinks she has RSV. See individual plan of care for RSV/Respiratory;   Provided education  to patient re: stroke prevention, s/s of heart attack and stroke; Reviewed prescribed diet heart healthy Reviewed medications with  patient and discussed importance of compliance;  Discussed plans with patient for ongoing care management follow up and provided patient with direct contact information for care management team; Advised patient, providing education and rationale, to monitor blood pressure daily and record, calling PCP for findings outside established parameters;  Reviewed scheduled/upcoming provider appointments including: 12-14-2021 Provided education on prescribed diet heart healthy;  Discussed complications of poorly controlled blood pressure such as heart disease, stroke, circulatory complications, vision complications, kidney impairment, sexual dysfunction;    Pain:  (Status: Goal on track: YES.) Pain assessment performed. 06-24-2021: Denies any pain at this time. Will continue to monitor. Medications reviewed Reviewed provider established plan for pain management; Discussed importance of adherence to all scheduled medical appointments; Counseled on the importance of reporting any/all new or changed pain symptoms or management strategies to pain management provider; Advised patient to report to care team affect of pain on daily activities; Discussed use of relaxation techniques and/or diversional activities to assist with pain reduction (distraction, imagery, relaxation, massage, acupressure, TENS, heat, and cold application; Reviewed with patient prescribed pharmacological and nonpharmacological pain relief strategies;     Weight Loss:  (Status: Goal on track: YES.) Advised patient to discuss with primary care provider options regarding weight management;  Collaboration with provider for dietician referral;  Provided patient and/or caregiver with contact information about care guides Advice worker or dietician); Provided verbal and/or written education to patient re: provider recommended life style modifications. 06-24-2021: the patient has been dealing with an upper respiratory infection and is  starting medications today. States that [facility] does not have one of the medications the cough syrup and is supposed to get it today. ;  Screening for signs and symptoms of depression;  Offered to connect patient with psychology or social work support for counseling and supportive care;  Reviewed recommended dietary changes: avoid fad diets, make small/incremental dietary and exercise changes, eat at the table and avoid eating in front of the TV, plan management of cravings, monitor snacking and cravings in food diary;     RSV/Upper Respiratory infection   (Status: Goal on track: NO.) Short Term Goal  Added on 06-24-2021 Evaluation of current treatment plan related to  RSV, Upper Respiratory Infection ,  self-management and patient's adherence to plan as established by provider. Discussed plans with patient for ongoing care management follow up and provided patient with direct contact information for care management team Advised patient to call the office for changes in respiratory status, fever, or chills. ; Provided education to patient re: sipping on honey mixed in warm water to soothe the throat and help with cough; Reviewed medications with patient and discussed The patient states her brother has gone to [facility] to pick up her medications. They do not have the tussinex and it is supposed to be in today. The patient is going to take the prednisone and tessalon when she gets it. Education on how to take and review of medications. ; Provided patient with sx and sx to monitor for changes in respiratory status, general information educational materials related to RSV, upper respiratory infection ; Reviewed scheduled/upcoming provider appointments including the patient is supposed to call the office tomorrow to check in with pcp concerning the status of current respiratory condition. Patient  has a regular appointment for 12-14-2021; Discussed plans with patient for ongoing care management follow up  and provided patient with  direct contact information for care management team; Advised patient to discuss new sx and sx, and progression of respiratory status with provider;    Patient Goals/Self-Care Activities: Patient will self administer medications as prescribed as evidenced by self report/primary caregiver report  Patient will attend all scheduled provider appointments as evidenced by clinician review of documented attendance to scheduled appointments and patient/caregiver report Patient will call pharmacy for medication refills as evidenced by patient report and review of pharmacy fill history as appropriate Patient will attend church or other social activities as evidenced by patient report Patient will continue to perform ADL's independently as evidenced by patient/caregiver report Patient will continue to perform IADL's independently as evidenced by patient/caregiver report Patient will call provider office for new concerns or questions as evidenced by review of documented incoming telephone call notes and patient report Patient will work with BSW to address care coordination needs and will continue to work with the clinical team to address health care and disease management related needs as evidenced by documented adherence to scheduled care management/care coordination appointments - check blood pressure 3 times per week - check blood pressure daily - check blood pressure weekly - choose a place to take my blood pressure (home, clinic or office, retail store) - write blood pressure results in a log or diary - learn about high blood pressure - keep a blood pressure log - take blood pressure log to all doctor appointments - call doctor for signs and symptoms of high blood pressure - develop an action plan for high blood pressure - keep all doctor appointments - take medications for blood pressure exactly as prescribed - report new symptoms to your doctor - eat more whole grains,  fruits and vegetables, lean meats and healthy fats - call for medicine refill 2 or 3 days before it runs out - take all medications exactly as prescribed - call doctor with any symptoms you believe are related to your medicine - call doctor when you experience any new symptoms - go to all doctor appointments as scheduled - adhere to prescribed diet: heart healthy diet       Our next appointment is by telephone on 08-19-2021 at 1030 am   Please call the care guide team at [phone] if you need to cancel or reschedule your appointment.   If you are experiencing a Mental Health or [place] or need someone to talk to, please call the Suicide and Crisis Lifeline: 988 call the [place] National Suicide Prevention Lifeline: (213)580-2636 or TTY: 518-455-2819 TTY 8040030607) to talk to a trained counselor call 1-800-273-TALK (toll free, 24 hour hotline)   Patient verbalizes understanding of instructions provided today and agrees to view in [place].   [name] RN, MSN, [facility] Mobile: [phone]

## 2021-06-24 NOTE — Chronic Care Management (AMB) (Signed)
Chronic Care Management   CCM RN Visit Note  06/24/2021 Name: Heather James MRN: [id number] DOB: [birth year]  Subjective: Heather James is a 70 y.o. year old female who is a primary care patient of [name], DO. The care management team was consulted for assistance with disease management and care coordination needs.    Engaged with patient by telephone for follow up visit in response to provider referral for case management and/or care coordination services.   Consent to Services:  The patient was given information about Chronic Care Management services, agreed to services, and gave verbal consent prior to initiation of services.  Please see initial visit note for detailed documentation.   Patient agreed to services and verbal consent obtained.   Assessment: Review of patient past medical history, allergies, medications, health status, including review of consultants reports, laboratory and other test data, was performed as part of comprehensive evaluation and provision of chronic care management services.   SDOH (Social Determinants of Health) assessments and interventions performed:    CCM Care Plan  No Known Allergies  Outpatient Encounter Medications as of 06/24/2021  Medication Sig   albuterol (VENTOLIN HFA) 108 (90 Base) MCG/ACT inhaler Inhale 2 puffs into the lungs every 6 (six) hours as needed for wheezing or shortness of breath.   aspirin 81 MG tablet Take 81 mg by mouth daily.   atorvastatin (LIPITOR) 20 MG tablet TAKE 1 TABLET(20 MG) BY MOUTH DAILY Strength: 20 mg   benzonatate (TESSALON) 200 MG capsule Take 1 capsule (200 mg total) by mouth 2 (two) times daily as needed for cough.   calcium carbonate (OS-CAL - DOSED IN MG OF ELEMENTAL CALCIUM) 1250 (500 Ca) MG tablet Take 1 tablet by mouth.   calcium-vitamin D (OSCAL WITH D) 500-200 MG-UNIT tablet Take 1 tablet by mouth.   chlorpheniramine-HYDROcodone (TUSSIONEX PENNKINETIC ER) 10-8 MG/5ML SUER Take 5 mLs  by mouth every 12 (twelve) hours as needed.   diclofenac sodium (VOLTAREN) 1 % GEL Apply 4 g topically 4 (four) times daily.   hydrochlorothiazide (HYDRODIURIL) 50 MG tablet Take 1 tablet (50 mg total) by mouth every other day.   meloxicam (MOBIC) 7.5 MG tablet Take 1 tablet (7.5 mg total) by mouth daily as needed.   mometasone (NASONEX) 50 MCG/ACT nasal spray Place 2 sprays into the nose daily.   potassium chloride SA (K-DUR,KLOR-CON) 20 MEQ tablet TK 1 T PO QD WF   predniSONE (DELTASONE) 50 MG tablet Take 1 tablet (50 mg total) by mouth daily with breakfast.   raloxifene (EVISTA) 60 MG tablet Take 1 tablet (60 mg total) by mouth daily.   No facility-administered encounter medications on file as of 06/24/2021.    Patient Active Problem List   Diagnosis Date Noted   BMI 40.0-44.9, adult ([place]) 06/05/2018   Hypercholesteremia 06/05/2018   Advanced care planning/counseling discussion 05/30/2017   Tendonitis of foot 05/24/2016   Hypertension 05/23/2015   CSF leak from ear 05/13/2015   Osteopenia 05/13/2015   Morbid obesity ([place]) 05/13/2015    Conditions to be addressed/monitored:HTN, HLD, Osteoarthritis, and Obesity and current upper respiratory/RSV infection   Care Plan : RNCM: General Plan of Care (Adult) for Chronic Disease Management and Care Coordination Needs  Updates made by Vanita Ingles, RN since 06/24/2021 12:00 AM     Problem: RNCM: Development of Plan of Care for Chronic Disease Management and Care Coordination Needs ( HTN, HLD, OA, Obesity)   Priority: High     Long-Range Goal: RNCM:  Effective Management  of Plan of Care for Chronic Disease Management and Care Coordination Needs ( HTN, HLD, OA, Obesity)   Start Date: 04/22/2021  Expected End Date: 04/22/2022  Priority: High  Note:   Current Barriers:  Knowledge Deficits related to plan of care for management of HTN, HLD, and obesity and OA  Chronic Disease Management support and education needs related to HTN, HLD,  and obesity and OA  RNCM Clinical Goal(s):  Patient will verbalize understanding of plan for management of HTN, HLD, Osteoarthritis, and Obesity   verbalize basic understanding of HTN, HLD, and Osteoporosis disease process and self health management plan Obesity  take all medications exactly as prescribed and will call provider for medication related questions demonstrate understanding of rationale for each prescribed medication  attend all scheduled medical appointments: 12-14-2021 demonstrate improved and ongoing adherence to prescribed treatment plan for HTN, HLD, Osteoporosis, and Obesity as evidenced by adherence to prescribed medication regimen contacting provider for new or worsened symptoms or questions  demonstrate a decrease in HTN, HLD, Osteoarthritis, and Obesity exacerbations  demonstrate ongoing self health care management ability for effective management of chronic conditions  through collaboration with RN Care manager, provider, and care team.   Interventions: 1:1 collaboration with primary care provider regarding development and update of comprehensive plan of care as evidenced by provider attestation and co-signature Inter-disciplinary care team collaboration (see longitudinal plan of care) Evaluation of current treatment plan related to  self management and patient's adherence to plan as established by provider   SDOH Barriers (Status: Goal on track: YES.) Long Term Goal (>30 days) Patient interviewed and SDOH assessment performed        Patient interviewed and appropriate assessments performed Provided patient with information about resources available in [facility] and care guides to help with any questions and concerns or changes in SDOH Discussed plans with patient for ongoing care management follow up and provided patient with direct contact information for care management team Advised patient to call the office for changes in [place], Questions, or  concerns    Hyperlipidemia:  (Status: Goal on track: YES.) Lab Results  Component Value Date   CHOL 129 06/15/2021   HDL 60 06/15/2021   LDLCALC 54 06/15/2021   TRIG 72 06/15/2021   CHOLHDL 4.0 06/03/2017     Medication review performed; medication list updated in electronic medical record.  Provider established cholesterol goals reviewed; Counseled on importance of regular laboratory monitoring as prescribed. 06-24-2021: Review of recent lab work. Patient is at goal. Praised for good results; Provided HLD educational materials; Reviewed role and benefits of statin for ASCVD risk reduction; Discussed strategies to manage statin-induced myalgias; Reviewed importance of limiting foods high in cholesterol. 06-24-2021: Review and education provided Screening for signs and symptoms of depression related to chronic disease state;  Assessed social determinant of health barriers;   Hypertension: (Status: Goal on track: YES.) Last practice recorded BP readings:  BP Readings from Last 3 Encounters:  06/23/21 115/72  06/15/21 125/77  12/22/20 126/78  Most recent eGFR/CrCl:  Lab Results  Component Value Date   EGFR 66 06/15/2021    No components found for: CRCL  Evaluation of current treatment plan related to hypertension self management and patient's adherence to plan as established by provider. 06-24-2021: The patient is doing well with management of blood pressures. The patient states that the only issue she is having right now is an URI. PCP thinks she has RSV. See individual plan of care for RSV/Respiratory;  Provided education to patient re: stroke prevention, s/s of heart attack and stroke; Reviewed prescribed diet heart healthy Reviewed medications with patient and discussed importance of compliance;  Discussed plans with patient for ongoing care management follow up and provided patient with direct contact information for care management team; Advised patient, providing  education and rationale, to monitor blood pressure daily and record, calling PCP for findings outside established parameters;  Reviewed scheduled/upcoming provider appointments including: 12-14-2021 Provided education on prescribed diet heart healthy;  Discussed complications of poorly controlled blood pressure such as heart disease, stroke, circulatory complications, vision complications, kidney impairment, sexual dysfunction;   Pain:  (Status: Goal on track: YES.) Pain assessment performed. 06-24-2021: Denies any pain at this time. Will continue to monitor. Medications reviewed Reviewed provider established plan for pain management; Discussed importance of adherence to all scheduled medical appointments; Counseled on the importance of reporting any/all new or changed pain symptoms or management strategies to pain management provider; Advised patient to report to care team affect of pain on daily activities; Discussed use of relaxation techniques and/or diversional activities to assist with pain reduction (distraction, imagery, relaxation, massage, acupressure, TENS, heat, and cold application; Reviewed with patient prescribed pharmacological and nonpharmacological pain relief strategies;   Weight Loss:  (Status: Goal on track: YES.) Advised patient to discuss with primary care provider options regarding weight management;  Collaboration with provider for dietician referral;  Provided patient and/or caregiver with contact information about care guides Advice worker or dietician); Provided verbal and/or written education to patient re: provider recommended life style modifications. 06-24-2021: the patient has been dealing with an upper respiratory infection and is starting medications today. States that [facility] does not have one of the medications the cough syrup and is supposed to get it today. ;  Screening for signs and symptoms of depression;  Offered to connect patient with  psychology or social work support for counseling and supportive care;  Reviewed recommended dietary changes: avoid fad diets, make small/incremental dietary and exercise changes, eat at the table and avoid eating in front of the TV, plan management of cravings, monitor snacking and cravings in food diary;   RSV/Upper Respiratory infection   (Status: Goal on track: NO.) Short Term Goal  Added on 06-24-2021 Evaluation of current treatment plan related to  RSV, Upper Respiratory Infection ,  self-management and patient's adherence to plan as established by provider. Discussed plans with patient for ongoing care management follow up and provided patient with direct contact information for care management team Advised patient to call the office for changes in respiratory status, fever, or chills. ; Provided education to patient re: sipping on honey mixed in warm water to soothe the throat and help with cough; Reviewed medications with patient and discussed The patient states her brother has gone to [facility] to pick up her medications. They do not have the tussinex and it is supposed to be in today. The patient is going to take the prednisone and tessalon when she gets it. Education on how to take and review of medications. ; Provided patient with sx and sx to monitor for changes in respiratory status, general information educational materials related to RSV, upper respiratory infection ; Reviewed scheduled/upcoming provider appointments including the patient is supposed to call the office tomorrow to check in with pcp concerning the status of current respiratory condition. Patient  has a regular appointment for 12-14-2021; Discussed plans with patient for ongoing care management follow up and provided patient with direct contact information for  care management team; Advised patient to discuss new sx and sx, and progression of respiratory status with provider;   Patient Goals/Self-Care Activities: Patient  will self administer medications as prescribed as evidenced by self report/primary caregiver report  Patient will attend all scheduled provider appointments as evidenced by clinician review of documented attendance to scheduled appointments and patient/caregiver report Patient will call pharmacy for medication refills as evidenced by patient report and review of pharmacy fill history as appropriate Patient will attend church or other social activities as evidenced by patient report Patient will continue to perform ADL's independently as evidenced by patient/caregiver report Patient will continue to perform IADL's independently as evidenced by patient/caregiver report Patient will call provider office for new concerns or questions as evidenced by review of documented incoming telephone call notes and patient report Patient will work with BSW to address care coordination needs and will continue to work with the clinical team to address health care and disease management related needs as evidenced by documented adherence to scheduled care management/care coordination appointments - check blood pressure 3 times per week - check blood pressure daily - check blood pressure weekly - choose a place to take my blood pressure (home, clinic or office, retail store) - write blood pressure results in a log or diary - learn about high blood pressure - keep a blood pressure log - take blood pressure log to all doctor appointments - call doctor for signs and symptoms of high blood pressure - develop an action plan for high blood pressure - keep all doctor appointments - take medications for blood pressure exactly as prescribed - report new symptoms to your doctor - eat more whole grains, fruits and vegetables, lean meats and healthy fats - call for medicine refill 2 or 3 days before it runs out - take all medications exactly as prescribed - call doctor with any symptoms you believe are related to your  medicine - call doctor when you experience any new symptoms - go to all doctor appointments as scheduled - adhere to prescribed diet: heart healthy diet        Plan:Telephone follow up appointment with care management team member scheduled for:  08-19-2021 at 54 am  [name] RN, MSN, [facility] Mobile: [phone]

## 2021-06-24 NOTE — Chronic Care Management (AMB) (Signed)
Chronic Care Management   CCM RN Visit Note  06/24/2021 Name: Heather James MRN: [id number] DOB: 1950-12-22  Subjective: Heather James is a 70 y.o. year old female who is a primary care patient of [name], DO. The care management team was consulted for assistance with disease management and care coordination needs.    Engaged with patient by telephone for follow up visit in response to provider referral for case management and/or care coordination services.   Consent to Services:  The patient was given information about Chronic Care Management services, agreed to services, and gave verbal consent prior to initiation of services.  Please see initial visit note for detailed documentation.   Patient agreed to services and verbal consent obtained.   Assessment: Review of patient past medical history, allergies, medications, health status, including review of consultants reports, laboratory and other test data, was performed as part of comprehensive evaluation and provision of chronic care management services.   SDOH (Social Determinants of Health) assessments and interventions performed:    CCM Care Plan  No Known Allergies  Outpatient Encounter Medications as of 06/24/2021  Medication Sig   albuterol (VENTOLIN HFA) 108 (90 Base) MCG/ACT inhaler Inhale 2 puffs into the lungs every 6 (six) hours as needed for wheezing or shortness of breath.   aspirin 81 MG tablet Take 81 mg by mouth daily.   atorvastatin (LIPITOR) 20 MG tablet TAKE 1 TABLET(20 MG) BY MOUTH DAILY Strength: 20 mg   benzonatate (TESSALON) 200 MG capsule Take 1 capsule (200 mg total) by mouth 2 (two) times daily as needed for cough.   calcium carbonate (OS-CAL - DOSED IN MG OF ELEMENTAL CALCIUM) 1250 (500 Ca) MG tablet Take 1 tablet by mouth.   calcium-vitamin D (OSCAL WITH D) 500-200 MG-UNIT tablet Take 1 tablet by mouth.   chlorpheniramine-HYDROcodone (TUSSIONEX PENNKINETIC ER) 10-8 MG/5ML SUER Take 5 mLs  by mouth every 12 (twelve) hours as needed.   diclofenac sodium (VOLTAREN) 1 % GEL Apply 4 g topically 4 (four) times daily.   hydrochlorothiazide (HYDRODIURIL) 50 MG tablet Take 1 tablet (50 mg total) by mouth every other day.   meloxicam (MOBIC) 7.5 MG tablet Take 1 tablet (7.5 mg total) by mouth daily as needed.   mometasone (NASONEX) 50 MCG/ACT nasal spray Place 2 sprays into the nose daily.   potassium chloride SA (K-DUR,KLOR-CON) 20 MEQ tablet TK 1 T PO QD WF   predniSONE (DELTASONE) 50 MG tablet Take 1 tablet (50 mg total) by mouth daily with breakfast.   raloxifene (EVISTA) 60 MG tablet Take 1 tablet (60 mg total) by mouth daily.   No facility-administered encounter medications on file as of 06/24/2021.    Patient Active Problem List   Diagnosis Date Noted   BMI 40.0-44.9, adult ([place]) 06/05/2018   Hypercholesteremia 06/05/2018   Advanced care planning/counseling discussion 05/30/2017   Tendonitis of foot 05/24/2016   Hypertension 05/23/2015   CSF leak from ear 05/13/2015   Osteopenia 05/13/2015   Morbid obesity ([place]) 05/13/2015    Conditions to be addressed/monitored:HTN, HLD, and Patient inquiring about positive COVID result and asking for provider recommendations.   Care Plan : RNCM: General Plan of Care (Adult) for Chronic Disease Management and Care Coordination Needs  Updates made by Vanita Ingles, RN since 06/24/2021 12:00 AM     Problem: RNCM: Development of Plan of Care for Chronic Disease Management and Care Coordination Needs ( HTN, HLD, OA, Obesity)   Priority: High  Long-Range Goal: RNCM: Effective Management  of Plan of Care for Chronic Disease Management and Care Coordination Needs ( HTN, HLD, OA, Obesity)   Start Date: 04/22/2021  Expected End Date: 04/22/2022  Priority: High  Note:   Current Barriers:  Knowledge Deficits related to plan of care for management of HTN, HLD, and obesity and OA  Chronic Disease Management support and education needs  related to HTN, HLD, and obesity and OA  RNCM Clinical Goal(s):  Patient will verbalize understanding of plan for management of HTN, HLD, Osteoarthritis, and Obesity   verbalize basic understanding of HTN, HLD, and Osteoporosis disease process and self health management plan Obesity  take all medications exactly as prescribed and will call provider for medication related questions demonstrate understanding of rationale for each prescribed medication  attend all scheduled medical appointments: 12-14-2021 demonstrate improved and ongoing adherence to prescribed treatment plan for HTN, HLD, Osteoporosis, and Obesity as evidenced by adherence to prescribed medication regimen contacting provider for new or worsened symptoms or questions  demonstrate a decrease in HTN, HLD, Osteoarthritis, and Obesity exacerbations  demonstrate ongoing self health care management ability for effective management of chronic conditions  through collaboration with RN Care manager, provider, and care team.   Interventions: 1:1 collaboration with primary care provider regarding development and update of comprehensive plan of care as evidenced by provider attestation and co-signature Inter-disciplinary care team collaboration (see longitudinal plan of care) Evaluation of current treatment plan related to  self management and patient's adherence to plan as established by provider   SDOH Barriers (Status: Goal on track: YES.) Long Term Goal (>30 days) Patient interviewed and SDOH assessment performed        Patient interviewed and appropriate assessments performed Provided patient with information about resources available in [facility] and care guides to help with any questions and concerns or changes in SDOH Discussed plans with patient for ongoing care management follow up and provided patient with direct contact information for care management team Advised patient to call the office for changes in [place], Questions,  or concerns    Hyperlipidemia:  (Status: Goal on track: YES.) Lab Results  Component Value Date   CHOL 129 06/15/2021   HDL 60 06/15/2021   LDLCALC 54 06/15/2021   TRIG 72 06/15/2021   CHOLHDL 4.0 06/03/2017     Medication review performed; medication list updated in electronic medical record.  Provider established cholesterol goals reviewed; Counseled on importance of regular laboratory monitoring as prescribed. 06-24-2021: Review of recent lab work. Patient is at goal. Praised for good results; Provided HLD educational materials; Reviewed role and benefits of statin for ASCVD risk reduction; Discussed strategies to manage statin-induced myalgias; Reviewed importance of limiting foods high in cholesterol. 06-24-2021: Review and education provided Screening for signs and symptoms of depression related to chronic disease state;  Assessed social determinant of health barriers;   Hypertension: (Status: Goal on track: YES.) Last practice recorded BP readings:  BP Readings from Last 3 Encounters:  06/23/21 115/72  06/15/21 125/77  12/22/20 126/78  Most recent eGFR/CrCl:  Lab Results  Component Value Date   EGFR 66 06/15/2021    No components found for: CRCL  Evaluation of current treatment plan related to hypertension self management and patient's adherence to plan as established by provider. 06-24-2021: The patient is doing well with management of blood pressures. The patient states that the only issue she is having right now is an URI. PCP thinks she has RSV. See individual plan of  care for RSV/Respiratory;   Provided education to patient re: stroke prevention, s/s of heart attack and stroke; Reviewed prescribed diet heart healthy Reviewed medications with patient and discussed importance of compliance;  Discussed plans with patient for ongoing care management follow up and provided patient with direct contact information for care management team; Advised patient, providing  education and rationale, to monitor blood pressure daily and record, calling PCP for findings outside established parameters;  Reviewed scheduled/upcoming provider appointments including: 12-14-2021 Provided education on prescribed diet heart healthy;  Discussed complications of poorly controlled blood pressure such as heart disease, stroke, circulatory complications, vision complications, kidney impairment, sexual dysfunction;   Pain:  (Status: Goal on track: YES.) Pain assessment performed. 06-24-2021: Denies any pain at this time. Will continue to monitor. Medications reviewed Reviewed provider established plan for pain management; Discussed importance of adherence to all scheduled medical appointments; Counseled on the importance of reporting any/all new or changed pain symptoms or management strategies to pain management provider; Advised patient to report to care team affect of pain on daily activities; Discussed use of relaxation techniques and/or diversional activities to assist with pain reduction (distraction, imagery, relaxation, massage, acupressure, TENS, heat, and cold application; Reviewed with patient prescribed pharmacological and nonpharmacological pain relief strategies;   Weight Loss:  (Status: Goal on track: YES.) Advised patient to discuss with primary care provider options regarding weight management;  Collaboration with provider for dietician referral;  Provided patient and/or caregiver with contact information about care guides Advice worker or dietician); Provided verbal and/or written education to patient re: provider recommended life style modifications. 06-24-2021: the patient has been dealing with an upper respiratory infection and is starting medications today. States that [facility] does not have one of the medications the cough syrup and is supposed to get it today. ;  Screening for signs and symptoms of depression;  Offered to connect patient with  psychology or social work support for counseling and supportive care;  Reviewed recommended dietary changes: avoid fad diets, make small/incremental dietary and exercise changes, eat at the table and avoid eating in front of the TV, plan management of cravings, monitor snacking and cravings in food diary;   RSV/Upper Respiratory infection   (Status: Goal on track: NO.) Short Term Goal  Added on 06-24-2021 Evaluation of current treatment plan related to  RSV, Upper Respiratory Infection ,  self-management and patient's adherence to plan as established by provider. Discussed plans with patient for ongoing care management follow up and provided patient with direct contact information for care management team Advised patient to call the office for changes in respiratory status, fever, or chills. ; Provided education to patient re: sipping on honey mixed in warm water to soothe the throat and help with cough; Reviewed medications with patient and discussed The patient states her brother has gone to [facility] to pick up her medications. They do not have the tussinex and it is supposed to be in today. The patient is going to take the prednisone and tessalon when she gets it. Education on how to take and review of medications. ; Provided patient with sx and sx to monitor for changes in respiratory status, general information educational materials related to RSV, upper respiratory infection ; Reviewed scheduled/upcoming provider appointments including the patient is supposed to call the office tomorrow to check in with pcp concerning the status of current respiratory condition. Patient  has a regular appointment for 12-14-2021; Discussed plans with patient for ongoing care management follow up and provided patient  with direct contact information for care management team; Advised patient to discuss new sx and sx, and progression of respiratory status with provider;  06-24-2021: Incoming call from the patient  after getting an alert about test results. The patient is concerned about positive COVID test and wanted to know if she needed to do anything else as recommended by the pcp. Review of universal precautions and isolation per CDC guidelines. Also have sent an in basket message to the pcp asking if there are any other recommendations from the pcp for the patient. Will correspond with the patient any communication received from the pcp. The patient has started taking prednisone and states she feels better. Will continue to monitor and advise accordingly.   Patient Goals/Self-Care Activities: Patient will self administer medications as prescribed as evidenced by self report/primary caregiver report  Patient will attend all scheduled provider appointments as evidenced by clinician review of documented attendance to scheduled appointments and patient/caregiver report Patient will call pharmacy for medication refills as evidenced by patient report and review of pharmacy fill history as appropriate Patient will attend church or other social activities as evidenced by patient report Patient will continue to perform ADL's independently as evidenced by patient/caregiver report Patient will continue to perform IADL's independently as evidenced by patient/caregiver report Patient will call provider office for new concerns or questions as evidenced by review of documented incoming telephone call notes and patient report Patient will work with BSW to address care coordination needs and will continue to work with the clinical team to address health care and disease management related needs as evidenced by documented adherence to scheduled care management/care coordination appointments - check blood pressure 3 times per week - check blood pressure daily - check blood pressure weekly - choose a place to take my blood pressure (home, clinic or office, retail store) - write blood pressure results in a log or diary -  learn about high blood pressure - keep a blood pressure log - take blood pressure log to all doctor appointments - call doctor for signs and symptoms of high blood pressure - develop an action plan for high blood pressure - keep all doctor appointments - take medications for blood pressure exactly as prescribed - report new symptoms to your doctor - eat more whole grains, fruits and vegetables, lean meats and healthy fats - call for medicine refill 2 or 3 days before it runs out - take all medications exactly as prescribed - call doctor with any symptoms you believe are related to your medicine - call doctor when you experience any new symptoms - go to all doctor appointments as scheduled - adhere to prescribed diet: heart healthy diet        Plan:The care management team will reach out to the patient again over the next 1 to 14  days.  [name] RN, MSN, [facility] Mobile: [phone]

## 2021-06-24 NOTE — Patient Instructions (Signed)
Visit Information  Thank you for taking time to visit with me today. Please don't hesitate to contact me if I can be of assistance to you before our next scheduled telephone appointment.  Following are the goals we discussed today:  06-24-2021: Incoming call from the patient after getting an alert about test results. The patient is concerned about positive COVID test and wanted to know if she needed to do anything else as recommended by the pcp. Review of universal precautions and isolation per CDC guidelines. Also have sent an in basket message to the pcp asking if there are any other recommendations from the pcp for the patient. Will correspond with the patient any communication received from the pcp. The patient has started taking prednisone and states she feels better. Will continue to monitor and advise accordingly.     Our next appointment is by telephone after recommendations received from pcp to give to the patient  Please call the care guide team at [phone] if you need to cancel or reschedule your appointment.   If you are experiencing a Mental Health or [place] or need someone to talk to, please call the Suicide and Crisis Lifeline: 988 call the [place] National Suicide Prevention Lifeline: 720-823-2482 or TTY: 660-170-8682 TTY (310)313-0311) to talk to a trained counselor call 1-800-273-TALK (toll free, 24 hour hotline)   Patient verbalizes understanding of instructions provided today and agrees to view in [place].   [name] RN, MSN, [facility] Mobile: [phone]

## 2021-06-27 DIAGNOSIS — I10 Essential (primary) hypertension: Secondary | ICD-10-CM

## 2021-06-27 DIAGNOSIS — M19041 Primary osteoarthritis, right hand: Secondary | ICD-10-CM

## 2021-06-27 DIAGNOSIS — E7800 Pure hypercholesterolemia, unspecified: Secondary | ICD-10-CM

## 2021-06-27 DIAGNOSIS — M19042 Primary osteoarthritis, left hand: Secondary | ICD-10-CM

## 2021-06-30 ENCOUNTER — Ambulatory Visit: Payer: Self-pay

## 2021-06-30 NOTE — Telephone Encounter (Signed)
Routing to provider to advise patient.

## 2021-06-30 NOTE — Telephone Encounter (Signed)
Chief Complaint: SOB episode this morning, clammy Symptoms: Cough is much better, no other symptoms Frequency: Happened x 15 minutes this morning Pertinent Negatives: Patient denies SOB at this time, no other symptoms Disposition: [] ED /[] Urgent Care (no appt availability in office) / [] Appointment(In office/virtual)/ []  [facility]/ [] Home Care/ [] Refused Recommended Disposition /[] [place] [facility]/ [x]  Follow-up with PCP Additional Notes: Patient reports this morning when going to shower she felt SOB, clammy, went to lay down and felt better after 15 minutes. She wants to know if another round of prednisone will help with that. Advised someone will call back with Dr. Durenda Age recommendation.    Summary: continued cough / rx request   The patient would like to be contacted by a member of staff regarding their cough   The patient was previously prescribed predniSONE (DELTASONE) 50 MG tablet [[id number]]  with no refills but would like to continue taking the medication   Please contact further when possible      Reason for Disposition  [1] Caller has NON-URGENT question (includes prescribed medication questions) AND [2] triager unable to answer  Answer Assessment - Initial Assessment Questions 1. MAIN CONCERN OR SYMPTOM:  "What is your main concern right now?" "What question do you have?" "What's the main symptom you're worried about?" (e.g., breathing difficulty, cough, fever. pain)     SOB this morning lasted 10-15 mintues 2. BETTER-SAME-WORSE: "Are you getting better, staying the same, or getting worse compared to how you felt at your last visit to the doctor (most recent medical visit)?"     Cough is much better, concerned about SOB episode, wondering if prednisone would help that 4. VISIT DATE: "When were you seen?" (Date)     06/23/21 5. VISIT DOCTOR: "What is the name of the doctor taking care of you now?"     Dr. Wynetta Emery  Protocols used: Recent Medical Visit  for Illness Follow-up Call-[facility]

## 2021-07-02 ENCOUNTER — Other Ambulatory Visit: Payer: Self-pay | Admitting: Family Medicine

## 2021-07-02 MED ORDER — PREDNISONE 50 MG PO TABS: 50.0000 mg | ORAL_TABLET | Freq: Every day | ORAL | 0 refills | Status: DC

## 2021-07-02 NOTE — Telephone Encounter (Signed)
Pt has called in to follow up on refill request. Advised pt of current status, pt expressed understanding. Pt would like to have Rx for Prednisone sent in as soon as possible if possible.

## 2021-07-24 DIAGNOSIS — H9042 Sensorineural hearing loss, unilateral, left ear, with unrestricted hearing on the contralateral side: Secondary | ICD-10-CM | POA: Diagnosis not present

## 2021-07-27 ENCOUNTER — Other Ambulatory Visit: Payer: Self-pay | Admitting: Family Medicine

## 2021-07-27 ENCOUNTER — Other Ambulatory Visit: Payer: Self-pay

## 2021-07-27 ENCOUNTER — Ambulatory Visit
Admission: RE | Admit: 2021-07-27 | Discharge: 2021-07-27 | Disposition: A | Discharge status: Home / Self Care | Payer: PPO | Source: Ambulatory Visit | Attending: Family Medicine | Admitting: Family Medicine

## 2021-07-27 DIAGNOSIS — R928 Other abnormal and inconclusive findings on diagnostic imaging of breast: Secondary | ICD-10-CM

## 2021-07-27 DIAGNOSIS — R921 Mammographic calcification found on diagnostic imaging of breast: Secondary | ICD-10-CM

## 2021-07-27 DIAGNOSIS — Z1231 Encounter for screening mammogram for malignant neoplasm of breast: Secondary | ICD-10-CM | POA: Diagnosis not present

## 2021-08-03 ENCOUNTER — Other Ambulatory Visit: Payer: Self-pay

## 2021-08-03 ENCOUNTER — Ambulatory Visit
Admission: RE | Admit: 2021-08-03 | Discharge: 2021-08-03 | Disposition: A | Discharge status: Home / Self Care | Payer: PPO | Source: Ambulatory Visit | Attending: Family Medicine | Admitting: Family Medicine

## 2021-08-03 DIAGNOSIS — R921 Mammographic calcification found on diagnostic imaging of breast: Secondary | ICD-10-CM | POA: Insufficient documentation

## 2021-08-03 DIAGNOSIS — R920 Mammographic microcalcification found on diagnostic imaging of breast: Secondary | ICD-10-CM | POA: Diagnosis not present

## 2021-08-03 DIAGNOSIS — R928 Other abnormal and inconclusive findings on diagnostic imaging of breast: Secondary | ICD-10-CM | POA: Insufficient documentation

## 2021-08-03 DIAGNOSIS — R922 Inconclusive mammogram: Secondary | ICD-10-CM | POA: Diagnosis not present

## 2021-08-04 ENCOUNTER — Other Ambulatory Visit: Payer: Self-pay | Admitting: Family Medicine

## 2021-08-04 DIAGNOSIS — R921 Mammographic calcification found on diagnostic imaging of breast: Secondary | ICD-10-CM

## 2021-08-04 DIAGNOSIS — R928 Other abnormal and inconclusive findings on diagnostic imaging of breast: Secondary | ICD-10-CM

## 2021-08-18 ENCOUNTER — Ambulatory Visit
Admission: RE | Admit: 2021-08-18 | Discharge: 2021-08-18 | Disposition: A | Discharge status: Home / Self Care | Payer: PPO | Source: Ambulatory Visit | Attending: Family Medicine | Admitting: Family Medicine

## 2021-08-18 ENCOUNTER — Other Ambulatory Visit: Payer: Self-pay | Admitting: Family Medicine

## 2021-08-18 ENCOUNTER — Other Ambulatory Visit: Payer: Self-pay

## 2021-08-18 DIAGNOSIS — R928 Other abnormal and inconclusive findings on diagnostic imaging of breast: Secondary | ICD-10-CM

## 2021-08-18 DIAGNOSIS — R921 Mammographic calcification found on diagnostic imaging of breast: Secondary | ICD-10-CM

## 2021-08-18 HISTORY — PX: BREAST BIOPSY: SHX20

## 2021-08-19 ENCOUNTER — Telehealth: Payer: PPO

## 2021-08-19 ENCOUNTER — Ambulatory Visit (INDEPENDENT_AMBULATORY_CARE_PROVIDER_SITE_OTHER): Payer: PPO

## 2021-08-19 DIAGNOSIS — Z9889 Other specified postprocedural states: Secondary | ICD-10-CM

## 2021-08-19 DIAGNOSIS — E7800 Pure hypercholesterolemia, unspecified: Secondary | ICD-10-CM

## 2021-08-19 DIAGNOSIS — R059 Cough, unspecified: Secondary | ICD-10-CM

## 2021-08-19 DIAGNOSIS — I10 Essential (primary) hypertension: Secondary | ICD-10-CM

## 2021-08-19 DIAGNOSIS — Z1231 Encounter for screening mammogram for malignant neoplasm of breast: Secondary | ICD-10-CM

## 2021-08-19 LAB — SURGICAL PATHOLOGY

## 2021-08-19 NOTE — Patient Instructions (Signed)
Visit Information  Thank you for taking time to visit with me today. Please don't hesitate to contact me if I can be of assistance to you before our next scheduled telephone appointment.  Following are the goals we discussed today:  RNCM Clinical Goal(s):  Patient will verbalize understanding of plan for management of HTN, HLD, Osteoarthritis, and Obesity   verbalize basic understanding of HTN, HLD, and Osteoporosis disease process and self health management plan Obesity  take all medications exactly as prescribed and will call provider for medication related questions demonstrate understanding of rationale for each prescribed medication  attend all scheduled medical appointments: 12-14-2021 demonstrate improved and ongoing adherence to prescribed treatment plan for HTN, HLD, Osteoporosis, and Obesity as evidenced by adherence to prescribed medication regimen contacting provider for new or worsened symptoms or questions  demonstrate a decrease in HTN, HLD, Osteoarthritis, and Obesity exacerbations  demonstrate ongoing self health care management ability for effective management of chronic conditions  through collaboration with RN Care manager, provider, and care team.    Interventions: 1:1 collaboration with primary care provider regarding development and update of comprehensive plan of care as evidenced by provider attestation and co-signature Inter-disciplinary care team collaboration (see longitudinal plan of care) Evaluation of current treatment plan related to  self management and patient's adherence to plan as established by provider     SDOH Barriers (Status: Goal on track: YES.) Long Term Goal (>30 days) Patient interviewed and SDOH assessment performed        Patient interviewed and appropriate assessments performed Provided patient with information about resources available in [facility] and care guides to help with any questions and concerns or changes in SDOH Discussed plans  with patient for ongoing care management follow up and provided patient with direct contact information for care management team Advised patient to call the office for changes in SDOH, Questions, or concerns       Hyperlipidemia:  (Status: Goal on track: YES.)      Lab Results  Component Value Date    CHOL 129 06/15/2021    HDL 60 06/15/2021    LDLCALC 54 06/15/2021    TRIG 72 06/15/2021    CHOLHDL 4.0 06/03/2017      Medication review performed; medication list updated in electronic medical record. 08-19-2021: The patient takes Atorvastatin 20 mg QD Provider established cholesterol goals reviewed. 08-19-2021: The patient is at goal. The patient is doing well with maintaining cholesterol levels; Counseled on importance of regular laboratory monitoring as prescribed. 08-19-2021: Review of recent lab work. Patient is at goal. Praised for good results; Provided HLD educational materials; Reviewed role and benefits of statin for ASCVD risk reduction; Discussed strategies to manage statin-induced myalgias; Reviewed importance of limiting foods high in cholesterol. 08-19-2021: Review and education provided Screening for signs and symptoms of depression related to chronic disease state;  Assessed social determinant of health barriers;    Hypertension: (Status: Goal on track: YES.) Last practice recorded BP readings:     BP Readings from Last 3 Encounters:  06/23/21 115/72  06/15/21 125/77  12/22/20 126/78  Most recent eGFR/CrCl:       Lab Results  Component Value Date    EGFR 66 06/15/2021    No components found for: CRCL   Evaluation of current treatment plan related to hypertension self management and patient's adherence to plan as established by provider. 06-24-2021: The patient is doing well with management of blood pressures. The patient states that the only issue she is  having right now is an URI. PCP thinks she has RSV. See individual plan of care for RSV/Respiratory. 08-19-2021:  The patient is doing well with HTN and heart health. Denies any issues. The patient is recovered from [place] that she had in December 2022. No issues noted at this time post COVID.    Provided education to patient re: stroke prevention, s/s of heart attack and stroke; Reviewed prescribed diet heart healthy Reviewed medications with patient and discussed importance of compliance;  Discussed plans with patient for ongoing care management follow up and provided patient with direct contact information for care management team; Advised patient, providing education and rationale, to monitor blood pressure daily and record, calling PCP for findings outside established parameters;  Reviewed scheduled/upcoming provider appointments including: 12-14-2021 Provided education on prescribed diet heart healthy;  Discussed complications of poorly controlled blood pressure such as heart disease, stroke, circulatory complications, vision complications, kidney impairment, sexual dysfunction;    Pain:  (Status: Goal Met.)Closing goal on 08-19-2021: The patient denies pain at this time. Will continue to monitor.  Pain assessment performed. 06-24-2021: Denies any pain at this time. Will continue to monitor. Medications reviewed Reviewed provider established plan for pain management; Discussed importance of adherence to all scheduled medical appointments; Counseled on the importance of reporting any/all new or changed pain symptoms or management strategies to pain management provider; Advised patient to report to care team affect of pain on daily activities; Discussed use of relaxation techniques and/or diversional activities to assist with pain reduction (distraction, imagery, relaxation, massage, acupressure, TENS, heat, and cold application; Reviewed with patient prescribed pharmacological and nonpharmacological pain relief strategies;     Weight Loss:  (Status: Goal on track: YES.) Advised patient to discuss with  primary care provider options regarding weight management;  Collaboration with provider for dietician referral;  Provided patient and/or caregiver with contact information about care guides Advice worker or dietician); Provided verbal and/or written education to patient re: provider recommended life style modifications. 06-24-2021: the patient has been dealing with an upper respiratory infection and is starting medications today. States that [facility] does not have one of the medications the cough syrup and is supposed to get it today. 08-19-2021: The patient is watching what she eats. She is a little nervous right now as she had a breast biopsy yesterday. The patient will be glad when she gets the results back. ;  Screening for signs and symptoms of depression;  Offered to connect patient with psychology or social work support for counseling and supportive care;  Reviewed recommended dietary changes: avoid fad diets, make small/incremental dietary and exercise changes, eat at the table and avoid eating in front of the TV, plan management of cravings, monitor snacking and cravings in food diary;     RSV/Upper Respiratory infection   (Status: Goal Met.) Short Term Goal  Added on 06-24-2021. Goal being closed at this time. The patient had COVID and is now doing much better. The patient states that she is not having any issue post COVID. Education on monitoring for hair loss and changes.  Evaluation of current treatment plan related to  RSV, Upper Respiratory Infection ,  self-management and patient's adherence to plan as established by provider. Discussed plans with patient for ongoing care management follow up and provided patient with direct contact information for care management team Advised patient to call the office for changes in respiratory status, fever, or chills. ; Provided education to patient re: sipping on honey mixed in warm water to  soothe the throat and help with cough; Reviewed  medications with patient and discussed The patient states her brother has gone to [facility] to [facility] up her medications. They do not have the tussinex and it is supposed to be in today. The patient is going to take the prednisone and tessalon when she gets it. Education on how to take and review of medications. ; Provided patient with sx and sx to monitor for changes in respiratory status, general information educational materials related to RSV, upper respiratory infection ; Reviewed scheduled/upcoming provider appointments including the patient is supposed to call the office tomorrow to check in with pcp concerning the status of current respiratory condition. Patient  has a regular appointment for 12-14-2021; Discussed plans with patient for ongoing care management follow up and provided patient with direct contact information for care management team; Advised patient to discuss new sx and sx, and progression of respiratory status with provider;  06-24-2021: Incoming call from the patient after getting an alert about test results. The patient is concerned about positive COVID test and wanted to know if she needed to do anything else as recommended by the pcp. Review of universal precautions and isolation per CDC guidelines. Also have sent an in basket message to the pcp asking if there are any other recommendations from the pcp for the patient. Will correspond with the patient any communication received from the pcp. The patient has started taking prednisone and states she feels better. Will continue to monitor and advise accordingly.      Right breast calcifications with biopsy on 08-18-2021  (Status: New goal.) Short Term Goal  Evaluation of current treatment plan related to  right breast with calcifications with biopsy on 08-18-2021 ,  self-management and patient's adherence to plan as established by provider. Discussed plans with patient for ongoing care management follow up and provided patient with  direct contact information for care management team Advised patient to monitor for sx and sx at the site of biopsy of breast tissue and to report fever, chills, changes in site condition, abnormal drainage from the area; Provided education to patient re: to thing positive, work with providers to get results in a timely manner and call the office for questions or concerns.  The patient is optimistic and feels good about the awaiting results. She is hopeful there is no reason to be concerned or need additional follow up for calcifications found on her mammogram with biopsy on 08-18-2021; Discussed plans with patient for ongoing care management follow up and provided patient with direct contact information for care management team; Advised patient to discuss question, concerns, and biopsy results from the biopsy with provider; Screening for signs and symptoms of depression related to chronic disease state;  Assessed social determinant of health barriers;  Emotional support and education given. Will continue to monitor for changes.      Patient Goals/Self-Care Activities: Patient will self administer medications as prescribed as evidenced by self report/primary caregiver report  Patient will attend all scheduled provider appointments as evidenced by clinician review of documented attendance to scheduled appointments and patient/caregiver report Patient will call pharmacy for medication refills as evidenced by patient report and review of pharmacy fill history as appropriate Patient will attend church or other social activities as evidenced by patient report Patient will continue to perform ADL's independently as evidenced by patient/caregiver report Patient will continue to perform IADL's independently as evidenced by patient/caregiver report Patient will call provider office for new concerns or questions as  evidenced by review of documented incoming telephone call notes and patient report Patient will  work with BSW to address care coordination needs and will continue to work with the clinical team to address health care and disease management related needs as evidenced by documented adherence to scheduled care management/care coordination appointments - check blood pressure 3 times per week - check blood pressure daily - check blood pressure weekly - choose a place to take my blood pressure (home, clinic or office, retail store) - write blood pressure results in a log or diary - learn about high blood pressure - keep a blood pressure log - take blood pressure log to all doctor appointments - call doctor for signs and symptoms of high blood pressure - develop an action plan for high blood pressure - keep all doctor appointments - take medications for blood pressure exactly as prescribed - report new symptoms to your doctor - eat more whole grains, fruits and vegetables, lean meats and healthy fats - call for medicine refill 2 or 3 days before it runs out - take all medications exactly as prescribed - call doctor with any symptoms you believe are related to your medicine - call doctor when you experience any new symptoms - go to all doctor appointments as scheduled - adhere to prescribed diet: heart healthy diet       Our next appointment is by telephone on 10-14-2021 at 1030 am  Please call the care guide team at [phone] if you need to cancel or reschedule your appointment.   If you are experiencing a Mental Health or [place] or need someone to talk to, please call the Suicide and Crisis Lifeline: 988 call the [place] National Suicide Prevention Lifeline: (862)349-4602 or TTY: 902-502-7035 TTY 256-204-5913) to talk to a trained counselor call 1-800-273-TALK (toll free, 24 hour hotline)   Patient verbalizes understanding of instructions and care plan provided today and agrees to view in [place]. Active MyChart status confirmed with patient.    [name] RN, MSN,  [facility] Mobile: [phone]

## 2021-08-19 NOTE — Chronic Care Management (AMB) (Signed)
Chronic Care Management   CCM RN Visit Note  08/19/2021 Name: Heather James MRN: [id number] DOB: August 16, 1950  Subjective: Heather James is a 71 y.o. year old female who is a primary care patient of [name], DO. The care management team was consulted for assistance with disease management and care coordination needs.    Engaged with patient by telephone for follow up visit in response to provider referral for case management and/or care coordination services.   Consent to Services:  The patient was given information about Chronic Care Management services, agreed to services, and gave verbal consent prior to initiation of services.  Please see initial visit note for detailed documentation.   Patient agreed to services and verbal consent obtained.   Assessment: Review of patient past medical history, allergies, medications, health status, including review of consultants reports, laboratory and other test data, was performed as part of comprehensive evaluation and provision of chronic care management services.   SDOH (Social Determinants of Health) assessments and interventions performed:    CCM Care Plan  No Known Allergies  Outpatient Encounter Medications as of 08/19/2021  Medication Sig   albuterol (VENTOLIN HFA) 108 (90 Base) MCG/ACT inhaler Inhale 2 puffs into the lungs every 6 (six) hours as needed for wheezing or shortness of breath.   aspirin 81 MG tablet Take 81 mg by mouth daily.   atorvastatin (LIPITOR) 20 MG tablet TAKE 1 TABLET(20 MG) BY MOUTH DAILY Strength: 20 mg   benzonatate (TESSALON) 200 MG capsule Take 1 capsule (200 mg total) by mouth 2 (two) times daily as needed for cough.   calcium carbonate (OS-CAL - DOSED IN MG OF ELEMENTAL CALCIUM) 1250 (500 Ca) MG tablet Take 1 tablet by mouth.   calcium-vitamin D (OSCAL WITH D) 500-200 MG-UNIT tablet Take 1 tablet by mouth.   chlorpheniramine-HYDROcodone (TUSSIONEX PENNKINETIC ER) 10-8 MG/5ML SUER Take 5 mLs by  mouth every 12 (twelve) hours as needed.   diclofenac sodium (VOLTAREN) 1 % GEL Apply 4 g topically 4 (four) times daily.   hydrochlorothiazide (HYDRODIURIL) 50 MG tablet Take 1 tablet (50 mg total) by mouth every other day.   meloxicam (MOBIC) 7.5 MG tablet Take 1 tablet (7.5 mg total) by mouth daily as needed.   mometasone (NASONEX) 50 MCG/ACT nasal spray Place 2 sprays into the nose daily.   potassium chloride SA (K-DUR,KLOR-CON) 20 MEQ tablet TK 1 T PO QD WF   predniSONE (DELTASONE) 50 MG tablet Take 1 tablet (50 mg total) by mouth daily with breakfast.   raloxifene (EVISTA) 60 MG tablet Take 1 tablet (60 mg total) by mouth daily.   No facility-administered encounter medications on file as of 08/19/2021.    Patient Active Problem List   Diagnosis Date Noted   BMI 40.0-44.9, adult ([place]) 06/05/2018   Hypercholesteremia 06/05/2018   Advanced care planning/counseling discussion 05/30/2017   Tendonitis of foot 05/24/2016   Hypertension 05/23/2015   CSF leak from ear 05/13/2015   Osteopenia 05/13/2015   Morbid obesity ([place]) 05/13/2015    Conditions to be addressed/monitored:HTN, HLD, Osteoarthritis, and Obesity, post covid, and calcifications to right breast with biopsy to right breast on 08-18-2021  Care Plan : RNCM: General Plan of Care (Adult) for Chronic Disease Management and Care Coordination Needs  Updates made by Vanita Ingles, RN since 08/19/2021 12:00 AM     Problem: RNCM: Development of Plan of Care for Chronic Disease Management and Care Coordination Needs ( HTN, HLD, OA, Obesity)   Priority:  High     Long-Range Goal: RNCM: Effective Management  of Plan of Care for Chronic Disease Management and Care Coordination Needs ( HTN, HLD, OA, Obesity)   Start Date: 04/22/2021  Expected End Date: 04/22/2022  Priority: High  Note:   Current Barriers:  Knowledge Deficits related to plan of care for management of HTN, HLD, and obesity and OA  Chronic Disease Management support  and education needs related to HTN, HLD, and obesity and OA  RNCM Clinical Goal(s):  Patient will verbalize understanding of plan for management of HTN, HLD, Osteoarthritis, and Obesity   verbalize basic understanding of HTN, HLD, and Osteoporosis disease process and self health management plan Obesity  take all medications exactly as prescribed and will call provider for medication related questions demonstrate understanding of rationale for each prescribed medication  attend all scheduled medical appointments: 12-14-2021 demonstrate improved and ongoing adherence to prescribed treatment plan for HTN, HLD, Osteoporosis, and Obesity as evidenced by adherence to prescribed medication regimen contacting provider for new or worsened symptoms or questions  demonstrate a decrease in HTN, HLD, Osteoarthritis, and Obesity exacerbations  demonstrate ongoing self health care management ability for effective management of chronic conditions  through collaboration with RN Care manager, provider, and care team.   Interventions: 1:1 collaboration with primary care provider regarding development and update of comprehensive plan of care as evidenced by provider attestation and co-signature Inter-disciplinary care team collaboration (see longitudinal plan of care) Evaluation of current treatment plan related to  self management and patient's adherence to plan as established by provider   SDOH Barriers (Status: Goal on track: YES.) Long Term Goal (>30 days) Patient interviewed and SDOH assessment performed        Patient interviewed and appropriate assessments performed Provided patient with information about resources available in [facility] and care guides to help with any questions and concerns or changes in SDOH Discussed plans with patient for ongoing care management follow up and provided patient with direct contact information for care management team Advised patient to call the office for changes  in [place], Questions, or concerns    Hyperlipidemia:  (Status: Goal on track: YES.) Lab Results  Component Value Date   CHOL 129 06/15/2021   HDL 60 06/15/2021   LDLCALC 54 06/15/2021   TRIG 72 06/15/2021   CHOLHDL 4.0 06/03/2017     Medication review performed; medication list updated in electronic medical record. 08-19-2021: The patient takes Atorvastatin 20 mg QD Provider established cholesterol goals reviewed. 08-19-2021: The patient is at goal. The patient is doing well with maintaining cholesterol levels; Counseled on importance of regular laboratory monitoring as prescribed. 08-19-2021: Review of recent lab work. Patient is at goal. Praised for good results; Provided HLD educational materials; Reviewed role and benefits of statin for ASCVD risk reduction; Discussed strategies to manage statin-induced myalgias; Reviewed importance of limiting foods high in cholesterol. 08-19-2021: Review and education provided Screening for signs and symptoms of depression related to chronic disease state;  Assessed social determinant of health barriers;   Hypertension: (Status: Goal on track: YES.) Last practice recorded BP readings:  BP Readings from Last 3 Encounters:  06/23/21 115/72  06/15/21 125/77  12/22/20 126/78  Most recent eGFR/CrCl:  Lab Results  Component Value Date   EGFR 66 06/15/2021    No components found for: CRCL  Evaluation of current treatment plan related to hypertension self management and patient's adherence to plan as established by provider. 06-24-2021: The patient is doing well with management  of blood pressures. The patient states that the only issue she is having right now is an URI. PCP thinks she has RSV. See individual plan of care for RSV/Respiratory. 08-19-2021: The patient is doing well with HTN and heart health. Denies any issues. The patient is recovered from [place] that she had in December 2022. No issues noted at this time post COVID.    Provided education to  patient re: stroke prevention, s/s of heart attack and stroke; Reviewed prescribed diet heart healthy Reviewed medications with patient and discussed importance of compliance;  Discussed plans with patient for ongoing care management follow up and provided patient with direct contact information for care management team; Advised patient, providing education and rationale, to monitor blood pressure daily and record, calling PCP for findings outside established parameters;  Reviewed scheduled/upcoming provider appointments including: 12-14-2021 Provided education on prescribed diet heart healthy;  Discussed complications of poorly controlled blood pressure such as heart disease, stroke, circulatory complications, vision complications, kidney impairment, sexual dysfunction;   Pain:  (Status: Goal Met.)Closing goal on 08-19-2021: The patient denies pain at this time. Will continue to monitor.  Pain assessment performed. 06-24-2021: Denies any pain at this time. Will continue to monitor. Medications reviewed Reviewed provider established plan for pain management; Discussed importance of adherence to all scheduled medical appointments; Counseled on the importance of reporting any/all new or changed pain symptoms or management strategies to pain management provider; Advised patient to report to care team affect of pain on daily activities; Discussed use of relaxation techniques and/or diversional activities to assist with pain reduction (distraction, imagery, relaxation, massage, acupressure, TENS, heat, and cold application; Reviewed with patient prescribed pharmacological and nonpharmacological pain relief strategies;   Weight Loss:  (Status: Goal on track: YES.) Advised patient to discuss with primary care provider options regarding weight management;  Collaboration with provider for dietician referral;  Provided patient and/or caregiver with contact information about care guides Advice worker  or dietician); Provided verbal and/or written education to patient re: provider recommended life style modifications. 06-24-2021: the patient has been dealing with an upper respiratory infection and is starting medications today. States that [facility] does not have one of the medications the cough syrup and is supposed to get it today. 08-19-2021: The patient is watching what she eats. She is a little nervous right now as she had a breast biopsy yesterday. The patient will be glad when she gets the results back. ;  Screening for signs and symptoms of depression;  Offered to connect patient with psychology or social work support for counseling and supportive care;  Reviewed recommended dietary changes: avoid fad diets, make small/incremental dietary and exercise changes, eat at the table and avoid eating in front of the TV, plan management of cravings, monitor snacking and cravings in food diary;   RSV/Upper Respiratory infection   (Status: Goal Met.) Short Term Goal  Added on 06-24-2021. Goal being closed at this time. The patient had COVID and is now doing much better. The patient states that she is not having any issue post COVID. Education on monitoring for hair loss and changes.  Evaluation of current treatment plan related to  RSV, Upper Respiratory Infection ,  self-management and patient's adherence to plan as established by provider. Discussed plans with patient for ongoing care management follow up and provided patient with direct contact information for care management team Advised patient to call the office for changes in respiratory status, fever, or chills. ; Provided education to patient re: sipping  on honey mixed in warm water to soothe the throat and help with cough; Reviewed medications with patient and discussed The patient states her brother has gone to [facility] to [facility] her medications. They do not have the tussinex and it is supposed to be in today. The patient is going to take the  prednisone and tessalon when she gets it. Education on how to take and review of medications. ; Provided patient with sx and sx to monitor for changes in respiratory status, general information educational materials related to RSV, upper respiratory infection ; Reviewed scheduled/upcoming provider appointments including the patient is supposed to call the office tomorrow to check in with pcp concerning the status of current respiratory condition. Patient  has a regular appointment for 12-14-2021; Discussed plans with patient for ongoing care management follow up and provided patient with direct contact information for care management team; Advised patient to discuss new sx and sx, and progression of respiratory status with provider;  06-24-2021: Incoming call from the patient after getting an alert about test results. The patient is concerned about positive COVID test and wanted to know if she needed to do anything else as recommended by the pcp. Review of universal precautions and isolation per CDC guidelines. Also have sent an in basket message to the pcp asking if there are any other recommendations from the pcp for the patient. Will correspond with the patient any communication received from the pcp. The patient has started taking prednisone and states she feels better. Will continue to monitor and advise accordingly.    Right breast calcifications with biopsy on 08-18-2021  (Status: New goal.) Short Term Goal  Evaluation of current treatment plan related to  right breast with calcifications with biopsy on 08-18-2021 ,  self-management and patient's adherence to plan as established by provider. Discussed plans with patient for ongoing care management follow up and provided patient with direct contact information for care management team Advised patient to monitor for sx and sx at the site of biopsy of breast tissue and to report fever, chills, changes in site condition, abnormal drainage from the  area; Provided education to patient re: to thing positive, work with providers to get results in a timely manner and call the office for questions or concerns.  The patient is optimistic and feels good about the awaiting results. She is hopeful there is no reason to be concerned or need additional follow up for calcifications found on her mammogram with biopsy on 08-18-2021; Discussed plans with patient for ongoing care management follow up and provided patient with direct contact information for care management team; Advised patient to discuss question, concerns, and biopsy results from the biopsy with provider; Screening for signs and symptoms of depression related to chronic disease state;  Assessed social determinant of health barriers;  Emotional support and education given. Will continue to monitor for changes.     Patient Goals/Self-Care Activities: Patient will self administer medications as prescribed as evidenced by self report/primary caregiver report  Patient will attend all scheduled provider appointments as evidenced by clinician review of documented attendance to scheduled appointments and patient/caregiver report Patient will call pharmacy for medication refills as evidenced by patient report and review of pharmacy fill history as appropriate Patient will attend church or other social activities as evidenced by patient report Patient will continue to perform ADL's independently as evidenced by patient/caregiver report Patient will continue to perform IADL's independently as evidenced by patient/caregiver report Patient will call provider office for new  concerns or questions as evidenced by review of documented incoming telephone call notes and patient report Patient will work with BSW to address care coordination needs and will continue to work with the clinical team to address health care and disease management related needs as evidenced by documented adherence to scheduled care  management/care coordination appointments - check blood pressure 3 times per week - check blood pressure daily - check blood pressure weekly - choose a place to take my blood pressure (home, clinic or office, retail store) - write blood pressure results in a log or diary - learn about high blood pressure - keep a blood pressure log - take blood pressure log to all doctor appointments - call doctor for signs and symptoms of high blood pressure - develop an action plan for high blood pressure - keep all doctor appointments - take medications for blood pressure exactly as prescribed - report new symptoms to your doctor - eat more whole grains, fruits and vegetables, lean meats and healthy fats - call for medicine refill 2 or 3 days before it runs out - take all medications exactly as prescribed - call doctor with any symptoms you believe are related to your medicine - call doctor when you experience any new symptoms - go to all doctor appointments as scheduled - adhere to prescribed diet: heart healthy diet        Plan:Telephone follow up appointment with care management team member scheduled for:  10-14-2021 at 5 am  [place], MSN, [facility] Mobile: [phone]

## 2021-08-25 DIAGNOSIS — E7800 Pure hypercholesterolemia, unspecified: Secondary | ICD-10-CM | POA: Diagnosis not present

## 2021-08-25 DIAGNOSIS — I10 Essential (primary) hypertension: Secondary | ICD-10-CM

## 2021-09-07 DIAGNOSIS — H2513 Age-related nuclear cataract, bilateral: Secondary | ICD-10-CM | POA: Diagnosis not present

## 2021-10-14 ENCOUNTER — Telehealth: Payer: PPO

## 2021-10-14 ENCOUNTER — Ambulatory Visit (INDEPENDENT_AMBULATORY_CARE_PROVIDER_SITE_OTHER): Payer: PPO

## 2021-10-14 DIAGNOSIS — I10 Essential (primary) hypertension: Secondary | ICD-10-CM

## 2021-10-14 DIAGNOSIS — Z1231 Encounter for screening mammogram for malignant neoplasm of breast: Secondary | ICD-10-CM

## 2021-10-14 DIAGNOSIS — E7800 Pure hypercholesterolemia, unspecified: Secondary | ICD-10-CM

## 2021-10-14 DIAGNOSIS — Z9889 Other specified postprocedural states: Secondary | ICD-10-CM

## 2021-10-14 DIAGNOSIS — M19041 Primary osteoarthritis, right hand: Secondary | ICD-10-CM

## 2021-10-14 NOTE — Patient Instructions (Signed)
Visit Information ? ?Thank you for taking time to visit with me today. Please don't hesitate to contact me if I can be of assistance to you before our next scheduled telephone appointment. ? ?Following are the goals we discussed today:  ?RNCM Clinical Goal(s):  ?Patient will verbalize understanding of plan for management of HTN, HLD, Osteoarthritis, and Obesity   ?verbalize basic understanding of HTN, HLD, and Osteoporosis disease process and self health management plan Obesity  ?take all medications exactly as prescribed and will call provider for medication related questions ?demonstrate understanding of rationale for each prescribed medication  ?attend all scheduled medical appointments: 12-14-2021 ?demonstrate improved and ongoing adherence to prescribed treatment plan for HTN, HLD, Osteoporosis, and Obesity as evidenced by adherence to prescribed medication regimen contacting provider for new or worsened symptoms or questions  ?demonstrate a decrease in HTN, HLD, Osteoarthritis, and Obesity exacerbations  ?demonstrate ongoing self health care management ability for effective management of chronic conditions  through collaboration with RN Care manager, provider, and care team.  ?  ?Interventions: ?1:1 collaboration with primary care provider regarding development and update of comprehensive plan of care as evidenced by provider attestation and co-signature ?Inter-disciplinary care team collaboration (see longitudinal plan of care) ?Evaluation of current treatment plan related to  self management and patient's adherence to plan as established by provider ?  ?  ?SDOH Barriers (Status: Goal on track: YES.) Long Term Goal (>30 days) ?Patient interviewed and SDOH assessment performed ?       ?Patient interviewed and appropriate assessments performed ?Provided patient with information about resources available in [facility] and care guides to help with any questions and concerns or changes in SDOH ?Discussed plans  with patient for ongoing care management follow up and provided patient with direct contact information for care management team ?Advised patient to call the office for changes in SDOH, Questions, or concerns ?No SDOH needs noted today. Patient has all needs met (10-14-2021) ?  ?  ?  ?Hyperlipidemia:  (Status: Goal on track: YES.) ?     ?Lab Results  ?Component Value Date  ?  CHOL 129 06/15/2021  ?  HDL 60 06/15/2021  ?  [place] 54 06/15/2021  ?  TRIG 72 06/15/2021  ?  CHOLHDL 4.0 06/03/2017  ?  ?  ?Medication review performed; medication list updated in electronic medical record. 10-14-2021: The patient takes Atorvastatin 20 mg QD ?Provider established cholesterol goals reviewed. 10-14-2021: The patient is at goal. The patient is doing well with maintaining cholesterol levels; ?Counseled on importance of regular laboratory monitoring as prescribed. 08-19-2021: Review of recent lab work. Patient is at goal. Praised for good results. 10-14-2021: Lab work is current.  ?Provided HLD educational materials; ?Reviewed role and benefits of statin for ASCVD risk reduction; ?Discussed strategies to manage statin-induced myalgias; ?Reviewed importance of limiting foods high in cholesterol. 10-14-2021: Review and education provided. The patient is trying to lose weight. States it is really hard but she is cutting back on her portion sizes. She states that she wants to lose more weight. Education on being more active to help with her weight loss and monitoring portions.  ?Screening for signs and symptoms of depression related to chronic disease state;  ?Assessed social determinant of health barriers;  ?  ?Hypertension: (Status: Goal on track: YES.) ?Last practice recorded BP readings:  ?   ?BP Readings from Last 3 Encounters:  ?06/23/21 115/72  ?06/15/21 125/77  ?12/22/20 126/78  ?Most recent eGFR/CrCl:  ?     ?  Lab Results  ?Component Value Date  ?  EGFR 66 06/15/2021  ?  No components found for: CRCL ?4--19-2023: Self reported blood  pressure from 10-13-2021 was 120/75 ?Evaluation of current treatment plan related to hypertension self management and patient's adherence to plan as established by provider. 06-24-2021: The patient is doing well with management of blood pressures. The patient states that the only issue she is having right now is an URI. PCP thinks she has RSV. See individual plan of care for RSV/Respiratory. 08-19-2021: The patient is doing well with HTN and heart health. Denies any issues. The patient is recovered from [place] that she had in December 2022. No issues noted at this time post COVID.   10-14-2021: The patient is doing well. Denies any new concerns. No further issues from breast concerns and no issues post COVID. The patient is happy that her conditions are stable and is looking forward to vacation in June to [facility]. Will continue to monitor for changes or needs.  ?Provided education to patient re: stroke prevention, s/s of heart attack and stroke; ?Reviewed prescribed diet heart healthy. 10-14-2021: The patient is compliant with heart healthy diet, is working on portion control and losing weight ?Reviewed medications with patient and discussed importance of compliance. 10-14-2021: The patient is compliant with medications. ;  ?Discussed plans with patient for ongoing care management follow up and provided patient with direct contact information for care management team; ?Advised patient, providing education and rationale, to monitor blood pressure daily and record, calling PCP for findings outside established parameters;  ?Reviewed scheduled/upcoming provider appointments including: 12-14-2021 ?Provided education on prescribed diet heart healthy;  ?Discussed complications of poorly controlled blood pressure such as heart disease, stroke, circulatory complications, vision complications, kidney impairment, sexual dysfunction;  ?  ?Pain:  (Status: Goal Met.)Closing goal on 08-19-2021: The patient denies pain at this time.  Will continue to monitor.  ?Pain assessment performed. 10-14-2021: Denies any pain at this time. Will continue to monitor. ?Medications reviewed. 10-14-2021: Knows how to effectively manage pain and discomfort when needed.  ?Reviewed provider established plan for pain management; ?Discussed importance of adherence to all scheduled medical appointments; ?Counseled on the importance of reporting any/all new or changed pain symptoms or management strategies to pain management provider; ?Advised patient to report to care team affect of pain on daily activities; ?Discussed use of relaxation techniques and/or diversional activities to assist with pain reduction (distraction, imagery, relaxation, massage, acupressure, TENS, heat, and cold application; ?Reviewed with patient prescribed pharmacological and nonpharmacological pain relief strategies; ?  ?  ?Weight Loss:  (Status: Goal on track: YES.) ?Advised patient to discuss with primary care provider options regarding weight management;  ?Collaboration with provider for dietician referral;  ?Provided patient and/or caregiver with contact information about care guides Advice worker or dietician); ?Provided verbal and/or written education to patient re: provider recommended life style modifications. 06-24-2021: the patient has been dealing with an upper respiratory infection and is starting medications today. States that [facility] does not have one of the medications the cough syrup and is supposed to get it today. 08-19-2021: The patient is watching what she eats. She is a little nervous right now as she had a breast biopsy yesterday. The patient will be glad when she gets the results back. 10-14-2021: Everything checked out fine with breast biopsy. She states she is thankful for this. Has no new issues. Is focusing on portion control and being more active. Enjoys being outside when it is pretty outside. Tips  for staying hydrated and portion control. Will continue to  monitor. The patient will be seeing pcp in June and will also be going on vacation to [facility]. Denies any acute findings at this time.  ;  ?Screening for signs and symptoms of depression;  ?Offere

## 2021-10-14 NOTE — Chronic Care Management (AMB) (Signed)
?Chronic Care Management  ? ?CCM RN Visit Note ? ?10/14/2021 ?Name: Heather James MRN: [id number] DOB: 1950-12-02 ? ?Subjective: ?Heather James is a 71 y.o. year old female who is a primary care patient of [name], DO. The care management team was consulted for assistance with disease management and care coordination needs.   ? ?Engaged with patient by telephone for follow up visit in response to provider referral for case management and/or care coordination services.  ? ?Consent to Services:  ?The patient was given information about Chronic Care Management services, agreed to services, and gave verbal consent prior to initiation of services.  Please see initial visit note for detailed documentation.  ? ?Patient agreed to services and verbal consent obtained.  ? ?Assessment: Review of patient past medical history, allergies, medications, health status, including review of consultants reports, laboratory and other test data, was performed as part of comprehensive evaluation and provision of chronic care management services.  ? ?SDOH (Social Determinants of Health) assessments and interventions performed:   ? ?CCM Care Plan ? ?No Known Allergies ? ?Outpatient Encounter Medications as of 10/14/2021  ?Medication Sig  ? albuterol (VENTOLIN HFA) 108 (90 Base) MCG/ACT inhaler Inhale 2 puffs into the lungs every 6 (six) hours as needed for wheezing or shortness of breath.  ? aspirin 81 MG tablet Take 81 mg by mouth daily.  ? atorvastatin (LIPITOR) 20 MG tablet TAKE 1 TABLET(20 MG) BY MOUTH DAILY Strength: 20 mg  ? benzonatate (TESSALON) 200 MG capsule Take 1 capsule (200 mg total) by mouth 2 (two) times daily as needed for cough.  ? calcium carbonate (OS-CAL - DOSED IN MG OF ELEMENTAL CALCIUM) 1250 (500 Ca) MG tablet Take 1 tablet by mouth.  ? calcium-vitamin D (OSCAL WITH D) 500-200 MG-UNIT tablet Take 1 tablet by mouth.  ? chlorpheniramine-HYDROcodone (TUSSIONEX PENNKINETIC ER) 10-8 MG/5ML SUER Take 5 mLs by  mouth every 12 (twelve) hours as needed.  ? diclofenac sodium (VOLTAREN) 1 % GEL Apply 4 g topically 4 (four) times daily.  ? hydrochlorothiazide (HYDRODIURIL) 50 MG tablet Take 1 tablet (50 mg total) by mouth every other day.  ? meloxicam (MOBIC) 7.5 MG tablet Take 1 tablet (7.5 mg total) by mouth daily as needed.  ? mometasone (NASONEX) 50 MCG/ACT nasal spray Place 2 sprays into the nose daily.  ? potassium chloride SA (K-DUR,KLOR-CON) 20 MEQ tablet TK 1 T PO QD WF  ? predniSONE (DELTASONE) 50 MG tablet Take 1 tablet (50 mg total) by mouth daily with breakfast.  ? raloxifene (EVISTA) 60 MG tablet Take 1 tablet (60 mg total) by mouth daily.  ? ?No facility-administered encounter medications on file as of 10/14/2021.  ? ? ?Patient Active Problem List  ? Diagnosis Date Noted  ? BMI 40.0-44.9, adult ([place]) 06/05/2018  ? Hypercholesteremia 06/05/2018  ? Advanced care planning/counseling discussion 05/30/2017  ? Tendonitis of foot 05/24/2016  ? Hypertension 05/23/2015  ? CSF leak from ear 05/13/2015  ? Osteopenia 05/13/2015  ? Morbid obesity ([place]) 05/13/2015  ? ? ?Conditions to be addressed/monitored:HTN, HLD, Osteoarthritis, and Obesity and results of breast biopsy from calcifications ? ?Care Plan : RNCM: General Plan of Care (Adult) for Chronic Disease Management and Care Coordination Needs  ?Updates made by Vanita Ingles, RN since 10/14/2021 12:00 AM  ?  ? ?Problem: RNCM: Development of Plan of Care for Chronic Disease Management and Care Coordination Needs ( HTN, HLD, OA, Obesity)   ?Priority: High  ?  ? ?Long-Range Goal:  RNCM: Effective Management  of Plan of Care for Chronic Disease Management and Care Coordination Needs ( HTN, HLD, OA, Obesity)   ?Start Date: 04/22/2021  ?Expected End Date: 04/22/2022  ?Priority: High  ?Note:   ?Current Barriers:  ?Knowledge Deficits related to plan of care for management of HTN, HLD, and obesity and OA  ?Chronic Disease Management support and education needs related to HTN, HLD,  and obesity and OA ? ?RNCM Clinical Goal(s):  ?Patient will verbalize understanding of plan for management of HTN, HLD, Osteoarthritis, and Obesity   ?verbalize basic understanding of HTN, HLD, and Osteoporosis disease process and self health management plan Obesity  ?take all medications exactly as prescribed and will call provider for medication related questions ?demonstrate understanding of rationale for each prescribed medication  ?attend all scheduled medical appointments: 12-14-2021 ?demonstrate improved and ongoing adherence to prescribed treatment plan for HTN, HLD, Osteoporosis, and Obesity as evidenced by adherence to prescribed medication regimen contacting provider for new or worsened symptoms or questions  ?demonstrate a decrease in HTN, HLD, Osteoarthritis, and Obesity exacerbations  ?demonstrate ongoing self health care management ability for effective management of chronic conditions  through collaboration with RN Care manager, provider, and care team.  ? ?Interventions: ?1:1 collaboration with primary care provider regarding development and update of comprehensive plan of care as evidenced by provider attestation and co-signature ?Inter-disciplinary care team collaboration (see longitudinal plan of care) ?Evaluation of current treatment plan related to  self management and patient's adherence to plan as established by provider ? ? ?SDOH Barriers (Status: Goal on track: YES.) Long Term Goal (>30 days) ?Patient interviewed and SDOH assessment performed ?       ?Patient interviewed and appropriate assessments performed ?Provided patient with information about resources available in [facility] and care guides to help with any questions and concerns or changes in SDOH ?Discussed plans with patient for ongoing care management follow up and provided patient with direct contact information for care management team ?Advised patient to call the office for changes in SDOH, Questions, or concerns ?No SDOH  needs noted today. Patient has all needs met (10-14-2021) ? ? ? ?Hyperlipidemia:  (Status: Goal on track: YES.) ?Lab Results  ?Component Value Date  ? CHOL 129 06/15/2021  ? HDL 60 06/15/2021  ? [place] 54 06/15/2021  ? TRIG 72 06/15/2021  ? CHOLHDL 4.0 06/03/2017  ?  ? ?Medication review performed; medication list updated in electronic medical record. 10-14-2021: The patient takes Atorvastatin 20 mg QD ?Provider established cholesterol goals reviewed. 10-14-2021: The patient is at goal. The patient is doing well with maintaining cholesterol levels; ?Counseled on importance of regular laboratory monitoring as prescribed. 08-19-2021: Review of recent lab work. Patient is at goal. Praised for good results. 10-14-2021: Lab work is current.  ?Provided HLD educational materials; ?Reviewed role and benefits of statin for ASCVD risk reduction; ?Discussed strategies to manage statin-induced myalgias; ?Reviewed importance of limiting foods high in cholesterol. 10-14-2021: Review and education provided. The patient is trying to lose weight. States it is really hard but she is cutting back on her portion sizes. She states that she wants to lose more weight. Education on being more active to help with her weight loss and monitoring portions.  ?Screening for signs and symptoms of depression related to chronic disease state;  ?Assessed social determinant of health barriers;  ? ?Hypertension: (Status: Goal on track: YES.) ?Last practice recorded BP readings:  ?BP Readings from Last 3 Encounters:  ?06/23/21 115/72  ?06/15/21 125/77  ?  12/22/20 126/78  ?Most recent eGFR/CrCl:  ?Lab Results  ?Component Value Date  ? EGFR 66 06/15/2021  ?  No components found for: CRCL ?4--19-2023: Self reported blood pressure from 10-13-2021 was 120/75 ?Evaluation of current treatment plan related to hypertension self management and patient's adherence to plan as established by provider. 06-24-2021: The patient is doing well with management of blood  pressures. The patient states that the only issue she is having right now is an URI. PCP thinks she has RSV. See individual plan of care for RSV/Respiratory. 08-19-2021: The patient is doing well with HTN and

## 2021-10-25 DIAGNOSIS — E785 Hyperlipidemia, unspecified: Secondary | ICD-10-CM

## 2021-10-25 DIAGNOSIS — I10 Essential (primary) hypertension: Secondary | ICD-10-CM | POA: Diagnosis not present

## 2021-11-27 ENCOUNTER — Ambulatory Visit (INDEPENDENT_AMBULATORY_CARE_PROVIDER_SITE_OTHER): Payer: PPO | Admitting: Nurse Practitioner

## 2021-11-27 ENCOUNTER — Other Ambulatory Visit: Payer: Self-pay | Admitting: Family Medicine

## 2021-11-27 ENCOUNTER — Ambulatory Visit: Payer: Self-pay

## 2021-11-27 ENCOUNTER — Encounter: Payer: Self-pay | Admitting: Nurse Practitioner

## 2021-11-27 VITALS — BP 133/69 | HR 84 | Temp 98.5°F | Wt 234.2 lb

## 2021-11-27 DIAGNOSIS — M109 Gout, unspecified: Secondary | ICD-10-CM

## 2021-11-27 MED ORDER — PREDNISONE 10 MG PO TABS: 10.0000 mg | ORAL_TABLET | Freq: Every day | ORAL | 0 refills | Status: DC

## 2021-11-27 NOTE — Telephone Encounter (Signed)
Pt has gout in her big toe/ and is asking for an RX to be called in for her to [facility] in [place] on [address]/ please advise      Chief Complaint: Pain, redness right Great toe. States "Malachy Mood treated me years ago for gout." Requesting medication be sent in. Symptoms: Pain, redness, puffy Frequency: Yesterday Pertinent Negatives: Patient denies fever Disposition: '[]'$ ED /'[]'$ Urgent Care (no appt availability in office) / '[]'$ Appointment(In office/virtual)/ '[]'$  [facility]/ '[]'$ Home Care/ '[]'$ Refused Recommended Disposition /'[]'$ [place] [facility]/ '[x]'$  Follow-up with PCP Additional Notes: Declines OV, asking for medication to be in to pharmacy.  Answer Assessment - Initial Assessment Questions 1. ONSET: "When did the pain start?"      Yesterday 2. LOCATION: "Where is the pain located?"   (e.g., around nail, entire toe, at foot joint)      Right Great Toe 3. PAIN: "How bad is the pain?"    (Scale 1-10; or mild, moderate, severe)   -  MILD (1-3): doesn't interfere with normal activities    -  MODERATE (4-7): interferes with normal activities (e.g., work or school) or awakens from sleep, limping    -  SEVERE (8-10): excruciating pain, unable to do any normal activities, unable to walk     Severe 4. APPEARANCE: "What does the toe look like?" (e.g., redness, swelling, bruising, pallor)     Red, puffy 5. CAUSE: "What do you think is causing the toe pain?"     Gout 6. OTHER SYMPTOMS: "Do you have any other symptoms?" (e.g., leg pain, rash, fever, numbness)     Pain 7. PREGNANCY: "Is there any chance you are pregnant?" "When was your last menstrual period?"     No  Protocols used: Toe Pain-A-AH

## 2021-11-27 NOTE — Progress Notes (Signed)
BP 133/69   Pulse 84   Temp 98.5 F (36.9 C) (Oral)   Wt 234 lb 3.2 oz (106.2 kg)   LMP  (LMP Unknown)   SpO2 96%   BMI 42.84 kg/m    Subjective:    Patient ID: Heather James, female    DOB: 1951/03/02, 71 y.o.   MRN: [id number]  HPI: Heather James is a 71 y.o. female  Chief Complaint  Patient presents with   Toe Pain    Pt reports gout flare up , toe pain in joint region of R big toe c/o redness as well. Onset yesterday afternoon   GOUT Duration: 1 day Right 1st metatarsophalangeal pain: yes Left 1st metatarsophalangeal pain: no Right knee pain: no Left knee pain: no Severity: 3/10  Quality:  feels bruised when she is walking or touching it and burning Swelling: yes Redness: yes Trauma: no Recent dietary change or indiscretion: no Fevers: no Nausea/vomiting: no Aggravating factors: Alleviating factors:  Status:  worse   Relevant past medical, surgical, family and social history reviewed and updated as indicated. Interim medical history since our last visit reviewed. Allergies and medications reviewed and updated.  Review of Systems  Musculoskeletal:        Right foot redness, swelling and pain.   Per HPI unless specifically indicated above     Objective:    BP 133/69   Pulse 84   Temp 98.5 F (36.9 C) (Oral)   Wt 234 lb 3.2 oz (106.2 kg)   LMP  (LMP Unknown)   SpO2 96%   BMI 42.84 kg/m   Wt Readings from Last 3 Encounters:  11/27/21 234 lb 3.2 oz (106.2 kg)  06/23/21 239 lb 9.6 oz (108.7 kg)  06/15/21 259 lb 3.2 oz (117.6 kg)    Physical Exam Vitals and nursing note reviewed.  Constitutional:      General: She is not in acute distress.    Appearance: Normal appearance. She is normal weight. She is not ill-appearing, toxic-appearing or diaphoretic.  HENT:     Head: Normocephalic.     Right Ear: External ear normal.     Left Ear: External ear normal.     Nose: Nose normal.     Mouth/Throat:     Mouth: Mucous membranes are moist.      Pharynx: Oropharynx is clear.  Eyes:     General:        Right eye: No discharge.        Left eye: No discharge.     Extraocular Movements: Extraocular movements intact.     Conjunctiva/sclera: Conjunctivae normal.     Pupils: Pupils are equal, round, and reactive to light.  Cardiovascular:     Rate and Rhythm: Normal rate and regular rhythm.     Heart sounds: No murmur heard. Pulmonary:     Effort: Pulmonary effort is normal. No respiratory distress.     Breath sounds: Normal breath sounds. No wheezing or rales.  Musculoskeletal:     Cervical back: Normal range of motion and neck supple.       Feet:  Feet:     Right foot:     Skin integrity: Erythema and warmth present. No ulcer, blister, skin breakdown, callus, dry skin or fissure.  Skin:    General: Skin is warm and dry.     Capillary Refill: Capillary refill takes less than 2 seconds.  Neurological:     General: No focal deficit present.  Mental Status: She is alert and oriented to person, place, and time. Mental status is at baseline.  Psychiatric:        Mood and Affect: Mood normal.        Behavior: Behavior normal.        Thought Content: Thought content normal.        Judgment: Judgment normal.    Results for orders placed or performed during the hospital encounter of 08/18/21  Surgical pathology  Result Value Ref Range   SURGICAL PATHOLOGY      SURGICAL PATHOLOGY CASE: [id number] PATIENT: Heather James Surgical Pathology Report     Specimen Submitted: A. Breast, right, lat post B. Breast, right, ant  Clinical History: 1) X clip within outer posterior right breast calcs. 2) Ribbon clip is 74m medial to the biopsied calcifications, outer anterior right breast.      DIAGNOSIS: A. RIGHT BREAST, OUTER POSTERIOR CALCIFICATIONS (X CLIP); STEREOTACTIC BIOPSY: - FIBROADENOMATOID CHANGES WITH ASSOCIATED COARSE CALCIFICATIONS. - NEGATIVE FOR ATYPIA AND MALIGNANCY.  B. RIGHT BREAST, ANTERIOR  CALCIFICATIONS (RIBBON CLIP); STEREOTACTIC BIOPSY: - FIBROADENOMATOID CHANGES WITH ASSOCIATED COARSE CALCIFICATIONS. - NEGATIVE FOR ATYPIA AND MALIGNANCY.   GROSS DESCRIPTION: A. Labeled: Right breast stereotactic biopsy lateral calcs outer posterior #1 Received: in a formalin-filled Brevera collection device Specimen radiograph image(s) available for review Time/Date in fixative: Collected at 9:05 AM on 08/18/2021  and placed in formalin at 9:07 AM on 08/18/2021 Cold ischemic time: Less than 5 minutes Total fixation time: Approximately 11.25 hours Core pieces: Multiple Measurement: Aggregate, 4.2 x 1.2 x 0.3 cm Description / comments: Received are cores and fragments of yellow fibrofatty tissue.  The accompanying diagram has sections A, B, and E checked. Inked: Blue Entirely submitted in cassette(s):  1 - section A 2 - section B 3 - section E 4 - 5 - remaining tissue fragments      4 - sections C and D      5 - sections F and G  B. Labeled: Right breast stereotactic biopsy anterior calcs #2 Received: in a formalin-filled Brevera collection device Specimen radiograph image(s) available for review Time/Date in fixative: Collected at 9:26 AM on 08/18/2021 and placed in formalin at 9:29 AM on 08/18/2021 Cold ischemic time: Less than 5 minutes Total fixation time: Approximately 10.75 hours Core pieces: Multiple Measurement: Aggregate, 4.5 x 1.1 x 0.3 cm Description / comments:  Received are cores and fragments of yellow fibrofatty tissue.  The accompanying diagram has sections C and D checked. Inked: Green Entirely submitted in cassette(s):  1 - section C 2 - section D 3 - 4 - remaining tissue fragments      3 - sections A and B      4 - sections E and F  RB 08/18/2021  Final Diagnosis performed by ABetsy Pries MD.   Electronically signed 08/19/2021 8:40:30AM The electronic signature indicates that the named Attending Pathologist has evaluated the specimen Technical  [place] performed [place] [address] [place] [id number]: [phone]: SRush Farmer MD, MMM  [facility] [address] [place] [address]Lab: [phone]: HKathi Simpers MD       Assessment & Plan:   Problem List Items Addressed This Visit   None Visit Diagnoses     Acute gout involving toe of right foot, unspecified cause    -  Primary   Will treat with Prednisone taper.  Can use cold pack to  help with pain.  Recommend resting. Follow up if symptoms do not improve.   Relevant Medications   predniSONE (DELTASONE) 10 MG tablet        Follow up plan: No follow-ups on file.

## 2021-11-27 NOTE — Telephone Encounter (Signed)
Medication Refill - Medication: predniSONE (DELTASONE) 10 MG tablet [[id number]]   Pt is calling to report that the computers at [facility] are down. Can the medication be sent to the below pharmacy please  Has the patient contacted their pharmacy? Yes.     Preferred Pharmacy (with phone number or street name): [facility], [place]. [address] [phone]   Has the patient been seen for an appointment in the last year OR does the patient have an upcoming appointment? Yes.    Agent: Please be advised that RX refills may take up to 3 business days. We ask that you follow-up with your pharmacy.

## 2021-11-27 NOTE — Telephone Encounter (Signed)
Duplicate request- Rx 09/02/08 #21 Requested Prescriptions  Pending Prescriptions Disp Refills  . predniSONE (DELTASONE) 10 MG tablet 21 tablet 0    Sig: Take 1 tablet (10 mg total) by mouth daily with breakfast. Take 6 today, 5 tomorrow, and decrease by 1 tab each day until course is complete.     Not Delegated - Endocrinology:  Oral Corticosteroids Failed - 11/27/2021  4:02 PM      Failed - This refill cannot be delegated      Failed - Manual Review: Eye exam for IOP if prolonged treatment      Passed - Glucose (serum) in normal range and within 180 days    Glucose  Date Value Ref Range Status  06/15/2021 93 70 - 99 mg/dL Final         Passed - K in normal range and within 180 days    Potassium  Date Value Ref Range Status  06/15/2021 4.3 3.5 - 5.2 mmol/L Final         Passed - Na in normal range and within 180 days    Sodium  Date Value Ref Range Status  06/15/2021 143 134 - 144 mmol/L Final         Passed - Last BP in normal range    BP Readings from Last 1 Encounters:  11/27/21 133/69         Passed - Valid encounter within last 6 months    Recent Outpatient Visits          Today Acute gout involving toe of right foot, unspecified cause   [place], NP   5 months ago Cough, unspecified type   [place], [place], DO   5 months ago Primary hypertension   [place], Ginger [place], DO   11 months ago Primary hypertension   [place], [place], DO   1 year ago Routine general medical examination at a health care facility   [place], Barb Merino, DO      Future Appointments            In 2 weeks [place] Emer[place], Barb Merino, DO [employer]   In 1 month [place], Megan P, DO [place], [facility]           Passed - Bone Mineral Density or Dexa Scan completed in the last 2 years

## 2021-12-14 ENCOUNTER — Encounter: Payer: Self-pay | Admitting: Family Medicine

## 2021-12-14 ENCOUNTER — Ambulatory Visit (INDEPENDENT_AMBULATORY_CARE_PROVIDER_SITE_OTHER): Payer: PPO | Admitting: Family Medicine

## 2021-12-14 VITALS — BP 135/83 | HR 98 | Temp 98.6°F | Ht 62.6 in | Wt 241.2 lb

## 2021-12-14 DIAGNOSIS — Z0000 Encounter for general adult medical examination without abnormal findings: Secondary | ICD-10-CM

## 2021-12-14 DIAGNOSIS — Z6841 Body Mass Index (BMI) 40.0 and over, adult: Secondary | ICD-10-CM | POA: Diagnosis not present

## 2021-12-14 DIAGNOSIS — M109 Gout, unspecified: Secondary | ICD-10-CM | POA: Diagnosis not present

## 2021-12-14 DIAGNOSIS — I10 Essential (primary) hypertension: Secondary | ICD-10-CM | POA: Diagnosis not present

## 2021-12-14 DIAGNOSIS — E7800 Pure hypercholesterolemia, unspecified: Secondary | ICD-10-CM | POA: Diagnosis not present

## 2021-12-14 LAB — MICROALBUMIN, URINE WAIVED
Creatinine, Urine Waived: 100 mg/dL (ref 10–300)
Microalb, Ur Waived: 10 mg/L (ref 0–19)
Microalb/Creat Ratio: <30 mg/g (ref <30)

## 2021-12-14 LAB — URINALYSIS, ROUTINE W REFLEX MICROSCOPIC
Bilirubin, UA: NEGATIVE
Glucose, UA: NEGATIVE
Ketones, UA: NEGATIVE
Leukocytes,UA: NEGATIVE
Nitrite, UA: NEGATIVE
Protein,UA: NEGATIVE
RBC, UA: NEGATIVE
Specific Gravity, UA: 1.015 (ref 1.005–1.030)
Urobilinogen, Ur: 0.2 mg/dL (ref 0.2–1.0)
pH, UA: 5.5 (ref 5.0–7.5)

## 2021-12-14 MED ORDER — HYDROCHLOROTHIAZIDE 50 MG PO TABS: 50.0000 mg | ORAL_TABLET | ORAL | 1 refills | Status: DC

## 2021-12-14 MED ORDER — MELOXICAM 7.5 MG PO TABS: 7.5000 mg | ORAL_TABLET | Freq: Every day | ORAL | 1 refills | Status: DC | PRN

## 2021-12-14 MED ORDER — ATORVASTATIN CALCIUM 20 MG PO TABS: ORAL_TABLET | ORAL | 1 refills | Status: DC

## 2021-12-14 MED ORDER — ALBUTEROL SULFATE HFA 108 (90 BASE) MCG/ACT IN AERS: 2.0000 | INHALATION_SPRAY | Freq: Four times a day (QID) | RESPIRATORY_TRACT | 6 refills | Status: DC | PRN

## 2021-12-14 NOTE — Assessment & Plan Note (Signed)
Encouraged diet and exercise with goal of losing 1-2lbs per week. Call with any concerns.  

## 2021-12-14 NOTE — Assessment & Plan Note (Signed)
Under good control on current regimen. Continue current regimen. Continue to monitor. Call with any concerns. Refills given. Labs drawn today.   

## 2021-12-14 NOTE — Progress Notes (Signed)
BP 135/83   Pulse 98   Temp 98.6 F (37 C) (Oral)   Ht 5' 2.6" (1.59 m)   Wt 241 lb 3.2 oz (109.4 kg)   LMP  (LMP Unknown)   SpO2 98%   BMI 43.28 kg/m    Subjective:    Patient ID: Heather James, female    DOB: [birth year], 71 y.o.   MRN: [id number]  HPI: Heather James is a 71 y.o. female presenting on 12/14/2021 for comprehensive medical examination. Current medical complaints include:  HYPERTENSION / HYPERLIPIDEMIA Satisfied with current treatment? yes Duration of hypertension: chronic BP monitoring frequency: not checking BP medication side effects: no Past BP meds: HCTZ Duration of hyperlipidemia: chronic Cholesterol medication side effects: no Cholesterol supplements: none Past cholesterol medications: atorvastatin Medication compliance: excellent compliance Aspirin: yes Recent stressors: no Recurrent headaches: no Visual changes: no Palpitations: no Dyspnea: no Chest pain: no Lower extremity edema: no Dizzy/lightheaded: no  Menopausal Symptoms: no  Depression Screen done today and results listed below:     12/14/2021    8:10 AM 11/27/2021    1:30 PM 06/23/2021    3:10 PM 06/15/2021    8:27 AM 06/11/2021   12:15 PM  Depression screen PHQ 2/9  Decreased Interest 0 0 0 0 0  Down, Depressed, Hopeless 0 0 0 0 0  PHQ - 2 Score 0 0 0 0 0  Altered sleeping 0 0 0 0   Tired, decreased energy 0 0 0 0   Change in appetite 0 0 0 0   Feeling bad or failure about yourself  0 0 0 0   Trouble concentrating 0 0 0 0   Moving slowly or fidgety/restless 0 0 0 0   Suicidal thoughts 0 0 0 0   PHQ-9 Score 0 0 0 0   Difficult doing work/chores Not difficult at all Not difficult at all Not difficult at all       Past Medical History:  Past Medical History:  Diagnosis Date   Cancer ([place])    skin ca on leg   CSF leak from ear    Hypercholesteremia    Hypertension    Obesity    Osteopenia     Surgical History:  Past Surgical History:  Procedure Laterality  Date   BREAST BIOPSY Left 11/21/1996   neg   BREAST BIOPSY Right 08/18/2021   affirm bx 2 area , x marker, path pending   BREAST SURGERY Left    cyst removed   COLONOSCOPY WITH PROPOFOL N/A 03/14/2018   Procedure: COLONOSCOPY WITH PROPOFOL;  Surgeon: [place], Benay Pike, MD;  Location: [facility] ENDOSCOPY;  Service: Gastroenterology;  Laterality: N/A;   FACIAL NERVE SURGERY     PILONIDAL CYST EXCISION     SKIN CANCER EXCISION Right    right leg, April 2017    Medications:  Current Outpatient Medications on File Prior to Visit  Medication Sig   aspirin 81 MG tablet Take 81 mg by mouth daily.   calcium carbonate (OS-CAL - DOSED IN MG OF ELEMENTAL CALCIUM) 1250 (500 Ca) MG tablet Take 1 tablet by mouth.   calcium-vitamin D (OSCAL WITH D) 500-200 MG-UNIT tablet Take 1 tablet by mouth.   diclofenac sodium (VOLTAREN) 1 % GEL Apply 4 g topically 4 (four) times daily.   mometasone (NASONEX) 50 MCG/ACT nasal spray Place 2 sprays into the nose daily.   potassium chloride SA (K-DUR,KLOR-CON) 20 MEQ tablet TK 1 T PO QD WF   raloxifene (EVISTA)  60 MG tablet Take 1 tablet (60 mg total) by mouth daily.   No current facility-administered medications on file prior to visit.    Allergies:  No Known Allergies  Social History:  Social History   Socioeconomic History   Marital status: Divorced    Spouse name: Not on file   Number of children: Not on file   Years of education: 12   Highest education level: 12th grade  Occupational History   Occupation: retired  Tobacco Use   Smoking status: Never   Smokeless tobacco: Never  Scientific laboratory technician Use: Never used  Substance and Sexual Activity   Alcohol use: No    Alcohol/week: 0.0 standard drinks of alcohol   Drug use: No   Sexual activity: Not Currently    Birth control/protection: None  Other Topics Concern   Not on file  Social History Narrative   Not on file   Social Determinants of Health   Financial Resource Strain: Low Risk   (06/11/2021)   Overall Financial Resource Strain (CARDIA)    Difficulty of Paying Living Expenses: Not hard at all  Food Insecurity: No Food Insecurity (06/11/2021)   Hunger Vital Sign    Worried About Running Out of Food in the Last Year: Never true    [place] in the Last Year: Never true  Transportation Needs: No Transportation Needs (06/11/2021)   PRAPARE - Hydrologist (Medical): No    Lack of Transportation (Non-Medical): No  Physical Activity: Insufficiently Active (06/11/2021)   Exercise Vital Sign    Days of Exercise per Week: 3 days    Minutes of Exercise per Session: 40 min  Stress: No Stress Concern Present (06/11/2021)   [place]    Feeling of Stress : Not at all  Social Connections: Moderately Isolated (06/11/2021)   Social Connection and Isolation Panel [NHANES]    Frequency of Communication with Friends and Family: More than three times a week    Frequency of Social Gatherings with Friends and Family: More than three times a week    Attends Religious Services: More than 4 times per year    Active Member of [employer] or Organizations: No    Attends Archivist Meetings: Never    Marital Status: Divorced  Human resources officer Violence: Not At Risk (06/11/2021)   Humiliation, Afraid, Rape, and Kick questionnaire    Fear of Current or Ex-Partner: No    Emotionally Abused: No    Physically Abused: No    Sexually Abused: No   Social History   Tobacco Use  Smoking Status Never  Smokeless Tobacco Never   Social History   Substance and Sexual Activity  Alcohol Use No   Alcohol/week: 0.0 standard drinks of alcohol    Family History:  Family History  Problem Relation Age of Onset   Heart disease Mother    Diabetes Mother    Hypertension Mother    Breast cancer Mother 40   Cancer Mother    Heart disease Father    Diabetes Father    Hypertension Brother     Cancer Maternal Grandmother        spine   Heart disease Maternal Grandfather    Hearing loss Maternal Grandfather    Heart disease Paternal Grandmother    Emphysema Paternal Grandfather     Past medical history, surgical history, medications, allergies, family history and social history reviewed with  patient today and changes made to appropriate areas of the chart.   Review of Systems  Constitutional: Negative.   HENT: Negative.    Eyes: Negative.   Respiratory:  Positive for wheezing. Negative for cough, hemoptysis, sputum production and shortness of breath.   Cardiovascular: Negative.   Gastrointestinal: Negative.   Genitourinary: Negative.   Musculoskeletal: Negative.   Skin: Negative.   Neurological: Negative.   Endo/Heme/Allergies: Negative.   Psychiatric/Behavioral: Negative.     All other ROS negative except what is listed above and in the HPI.      Objective:    BP 135/83   Pulse 98   Temp 98.6 F (37 C) (Oral)   Ht 5' 2.6" (1.59 m)   Wt 241 lb 3.2 oz (109.4 kg)   LMP  (LMP Unknown)   SpO2 98%   BMI 43.28 kg/m   Wt Readings from Last 3 Encounters:  12/14/21 241 lb 3.2 oz (109.4 kg)  11/27/21 234 lb 3.2 oz (106.2 kg)  06/23/21 239 lb 9.6 oz (108.7 kg)    Physical Exam Vitals and nursing note reviewed.  Constitutional:      General: She is not in acute distress.    Appearance: Normal appearance. She is obese. She is not ill-appearing, toxic-appearing or diaphoretic.  HENT:     Head: Normocephalic and atraumatic.     Right Ear: External ear normal.     Left Ear: External ear normal.     Nose: Nose normal.     Mouth/Throat:     Mouth: Mucous membranes are moist.     Pharynx: Oropharynx is clear.  Eyes:     General: No scleral icterus.       Right eye: No discharge.        Left eye: No discharge.     Extraocular Movements: Extraocular movements intact.     Conjunctiva/sclera: Conjunctivae normal.     Pupils: Pupils are equal, round, and reactive  to light.  Cardiovascular:     Rate and Rhythm: Normal rate and regular rhythm.     Pulses: Normal pulses.     Heart sounds: Normal heart sounds. No murmur heard.    No friction rub. No gallop.  Pulmonary:     Effort: Pulmonary effort is normal. No respiratory distress.     Breath sounds: Normal breath sounds. No stridor. No wheezing, rhonchi or rales.  Chest:     Chest wall: No tenderness.  Musculoskeletal:        General: Normal range of motion.     Cervical back: Normal range of motion and neck supple.  Skin:    General: Skin is warm and dry.     Capillary Refill: Capillary refill takes less than 2 seconds.     Coloration: Skin is not jaundiced or pale.     Findings: No bruising, erythema, lesion or rash.  Neurological:     General: No focal deficit present.     Mental Status: She is alert and oriented to person, place, and time. Mental status is at baseline.  Psychiatric:        Mood and Affect: Mood normal.        Behavior: Behavior normal.        Thought Content: Thought content normal.        Judgment: Judgment normal.     Results for orders placed or performed during the hospital encounter of 08/18/21  Surgical pathology  Result Value Ref Range   SURGICAL PATHOLOGY  SURGICAL PATHOLOGY CASE: [id number] PATIENT: Starr Bucklew Surgical Pathology Report     Specimen Submitted: A. Breast, right, lat post B. Breast, right, ant  Clinical History: 1) X clip within outer posterior right breast calcs. 2) Ribbon clip is 68m medial to the biopsied calcifications, outer anterior right breast.      DIAGNOSIS: A. RIGHT BREAST, OUTER POSTERIOR CALCIFICATIONS (X CLIP); STEREOTACTIC BIOPSY: - FIBROADENOMATOID CHANGES WITH ASSOCIATED COARSE CALCIFICATIONS. - NEGATIVE FOR ATYPIA AND MALIGNANCY.  B. RIGHT BREAST, ANTERIOR CALCIFICATIONS (RIBBON CLIP); STEREOTACTIC BIOPSY: - FIBROADENOMATOID CHANGES WITH ASSOCIATED COARSE CALCIFICATIONS. - NEGATIVE FOR ATYPIA AND  MALIGNANCY.   GROSS DESCRIPTION: A. Labeled: Right breast stereotactic biopsy lateral calcs outer posterior #1 Received: in a formalin-filled Brevera collection device Specimen radiograph image(s) available for review Time/Date in fixative: Collected at 9:05 AM on 08/18/2021  and placed in formalin at 9:07 AM on 08/18/2021 Cold ischemic time: Less than 5 minutes Total fixation time: Approximately 11.25 hours Core pieces: Multiple Measurement: Aggregate, 4.2 x 1.2 x 0.3 cm Description / comments: Received are cores and fragments of yellow fibrofatty tissue.  The accompanying diagram has sections A, B, and E checked. Inked: Blue Entirely submitted in cassette(s):  1 - section A 2 - section B 3 - section E 4 - 5 - remaining tissue fragments      4 - sections C and D      5 - sections F and G  B. Labeled: Right breast stereotactic biopsy anterior calcs #2 Received: in a formalin-filled Brevera collection device Specimen radiograph image(s) available for review Time/Date in fixative: Collected at 9:26 AM on 08/18/2021 and placed in formalin at 9:29 AM on 08/18/2021 Cold ischemic time: Less than 5 minutes Total fixation time: Approximately 10.75 hours Core pieces: Multiple Measurement: Aggregate, 4.5 x 1.1 x 0.3 cm Description / comments:  Received are cores and fragments of yellow fibrofatty tissue.  The accompanying diagram has sections C and D checked. Inked: Green Entirely submitted in cassette(s):  1 - section C 2 - section D 3 - 4 - remaining tissue fragments      3 - sections A and B      4 - sections E and F  RB 08/18/2021  Final Diagnosis performed by ABetsy Pries MD.   Electronically signed 08/19/2021 8:40:30AM The electronic signature indicates that the named Attending Pathologist has evaluated the specimen Technical component performed at [place] [address] [employer] [place] [address]Lab: [phone]: SRush Farmer MD, MMM  Professional component  performed at [facility] [address] [address]Lab: [phone]: HKathi Simpers MD       Assessment & Plan:   Problem List Items Addressed This Visit       Cardiovascular and Mediastinum   Hypertension    Under good control on current regimen. Continue current regimen. Continue to monitor. Call with any concerns. Refills given. Labs drawn today.       Relevant Medications   hydrochlorothiazide (HYDRODIURIL) 50 MG tablet   atorvastatin (LIPITOR) 20 MG tablet   Other Relevant Orders   CBC with Differential/Platelet   Comprehensive metabolic panel   Urinalysis, Routine w reflex microscopic   TSH   Microalbumin, Urine Waived     Other   Morbid obesity (HCC)    Encouraged diet and exercise with goal of losing 1-2lbs per week. Call with any concerns.       BMI 40.0-44.9, adult ([place]    Encouraged diet and  exercise with goal of losing 1-2lbs per week. Call with any concerns.       Hypercholesteremia    Under good control on current regimen. Continue current regimen. Continue to monitor. Call with any concerns. Refills given. Labs drawn today.       Relevant Medications   hydrochlorothiazide (HYDRODIURIL) 50 MG tablet   atorvastatin (LIPITOR) 20 MG tablet   Other Relevant Orders   CBC with Differential/Platelet   Comprehensive metabolic panel   Lipid Panel w/o Chol/HDL Ratio   Other Visit Diagnoses     Routine general medical examination at a health care facility    -  Primary   Vaccines up to date. Screening labs checked today. Mammo and DEXA and colonoscopy up to date. Continue diet and exercise. Call with any concerns.         Follow up plan: Return for OK to cancel July appt and reschedule for 6 months.   LABORATORY TESTING:  - Pap smear: not applicable  IMMUNIZATIONS:   - Tdap: Tetanus vaccination status reviewed: last tetanus booster within 10 years. - Influenza: Postponed to flu season -  Pneumovax: Up to date - Prevnar: Up to date - COVID: Up to date - HPV: Not applicable - Shingrix vaccine: Up to date  SCREENING: -Mammogram: Up to date  - Colonoscopy: Up to date  - Bone Density: Up to date   PATIENT COUNSELING:   Advised to take 1 mg of folate supplement per day if capable of pregnancy.   Sexuality: Discussed sexually transmitted diseases, partner selection, use of condoms, avoidance of unintended pregnancy  and contraceptive alternatives.   Advised to avoid cigarette smoking.  I discussed with the patient that most people either abstain from alcohol or drink within safe limits (<=14/week and <=4 drinks/occasion for males, <=7/weeks and <= 3 drinks/occasion for females) and that the risk for alcohol disorders and other health effects rises proportionally with the number of drinks per week and how often a drinker exceeds daily limits.  Discussed cessation/primary prevention of drug use and availability of treatment for abuse.   Diet: Encouraged to adjust caloric intake to maintain  or achieve ideal body weight, to reduce intake of dietary saturated fat and total fat, to limit sodium intake by avoiding high sodium foods and not adding table salt, and to maintain adequate dietary potassium and calcium preferably from fresh fruits, vegetables, and low-fat dairy products.    stressed the importance of regular exercise  Injury prevention: Discussed safety belts, safety helmets, smoke detector, smoking near bedding or upholstery.   Dental health: Discussed importance of regular tooth brushing, flossing, and dental visits.    NEXT PREVENTATIVE PHYSICAL DUE IN 1 YEAR. Return for OK to cancel July appt and reschedule for 6 months.

## 2021-12-15 LAB — CBC WITH DIFFERENTIAL/PLATELET
Basophils Absolute: 0 10*3/uL (ref 0.0–0.2)
Basos: 1 %
EOS (ABSOLUTE): 0.1 10*3/uL (ref 0.0–0.4)
Eos: 2 %
Hematocrit: 47.3 % — ABNORMAL HIGH (ref 34.0–46.6)
Hemoglobin: 15.6 g/dL (ref 11.1–15.9)
Immature Grans (Abs): 0 10*3/uL (ref 0.0–0.1)
Immature Granulocytes: 0 %
Lymphocytes Absolute: 1.3 10*3/uL (ref 0.7–3.1)
Lymphs: 21 %
MCH: 30.4 pg (ref 26.6–33.0)
MCHC: 33 g/dL (ref 31.5–35.7)
MCV: 92 fL (ref 79–97)
Monocytes Absolute: 0.5 10*3/uL (ref 0.1–0.9)
Monocytes: 8 %
Neutrophils Absolute: 4.1 10*3/uL (ref 1.4–7.0)
Neutrophils: 68 %
Platelets: 276 10*3/uL (ref 150–450)
RBC: 5.14 x10E6/uL (ref 3.77–5.28)
RDW: 13.5 % (ref 11.7–15.4)
WBC: 6 10*3/uL (ref 3.4–10.8)

## 2021-12-15 LAB — COMPREHENSIVE METABOLIC PANEL
ALT: 22 IU/L (ref 0–32)
AST: 24 IU/L (ref 0–40)
Albumin/Globulin Ratio: 1.8 (ref 1.2–2.2)
Albumin: 4.6 g/dL (ref 3.7–4.7)
Alkaline Phosphatase: 124 IU/L — ABNORMAL HIGH (ref 44–121)
BUN/Creatinine Ratio: 14 (ref 12–28)
BUN: 17 mg/dL (ref 8–27)
Bilirubin Total: 1 mg/dL (ref 0.0–1.2)
CO2: 25 mmol/L (ref 20–29)
Calcium: 9.9 mg/dL (ref 8.7–10.3)
Chloride: 102 mmol/L (ref 96–106)
Creatinine, Ser: 1.19 mg/dL — ABNORMAL HIGH (ref 0.57–1.00)
Globulin, Total: 2.5 g/dL (ref 1.5–4.5)
Glucose: 103 mg/dL — ABNORMAL HIGH (ref 70–99)
Potassium: 4.5 mmol/L (ref 3.5–5.2)
Sodium: 142 mmol/L (ref 134–144)
Total Protein: 7.1 g/dL (ref 6.0–8.5)
eGFR: 49 mL/min/{1.73_m2} — ABNORMAL LOW (ref >59)

## 2021-12-15 LAB — LIPID PANEL W/O CHOL/HDL RATIO
Cholesterol, Total: 149 mg/dL (ref 100–199)
HDL: 61 mg/dL (ref >39)
LDL Chol Calc (NIH): 70 mg/dL (ref 0–99)
Triglycerides: 98 mg/dL (ref 0–149)
VLDL Cholesterol Cal: 18 mg/dL (ref 5–40)

## 2021-12-15 LAB — TSH: TSH: 1.67 u[IU]/mL (ref 0.450–4.500)

## 2021-12-23 ENCOUNTER — Telehealth: Payer: PPO

## 2022-01-06 ENCOUNTER — Ambulatory Visit (INDEPENDENT_AMBULATORY_CARE_PROVIDER_SITE_OTHER): Payer: PPO

## 2022-01-06 ENCOUNTER — Telehealth: Payer: PPO

## 2022-01-06 DIAGNOSIS — E7800 Pure hypercholesterolemia, unspecified: Secondary | ICD-10-CM

## 2022-01-06 DIAGNOSIS — M19041 Primary osteoarthritis, right hand: Secondary | ICD-10-CM

## 2022-01-06 DIAGNOSIS — Z6841 Body Mass Index (BMI) 40.0 and over, adult: Secondary | ICD-10-CM

## 2022-01-06 DIAGNOSIS — I10 Essential (primary) hypertension: Secondary | ICD-10-CM

## 2022-01-06 NOTE — Chronic Care Management (AMB) (Signed)
Chronic Care Management   CCM RN Visit Note  01/06/2022 Name: Heather James MRN: [id number] DOB: 23-Dec-1950  Subjective: Heather James is a 71 y.o. year old female who is a primary care patient of [name], DO. The care management team was consulted for assistance with disease management and care coordination needs.    Engaged with patient by telephone for follow up visit in response to provider referral for case management and/or care coordination services.   Consent to Services:  The patient was given information about Chronic Care Management services, agreed to services, and gave verbal consent prior to initiation of services.  Please see initial visit note for detailed documentation.   Patient agreed to services and verbal consent obtained.   Assessment: Review of patient past medical history, allergies, medications, health status, including review of consultants reports, laboratory and other test data, was performed as part of comprehensive evaluation and provision of chronic care management services.   SDOH (Social Determinants of Health) assessments and interventions performed:    CCM Care Plan  No Known Allergies  Outpatient Encounter Medications as of 01/06/2022  Medication Sig   albuterol (VENTOLIN HFA) 108 (90 Base) MCG/ACT inhaler Inhale 2 puffs into the lungs every 6 (six) hours as needed for wheezing or shortness of breath.   aspirin 81 MG tablet Take 81 mg by mouth daily.   atorvastatin (LIPITOR) 20 MG tablet TAKE 1 TABLET(20 MG) BY MOUTH DAILY Strength: 20 mg   calcium carbonate (OS-CAL - DOSED IN MG OF ELEMENTAL CALCIUM) 1250 (500 Ca) MG tablet Take 1 tablet by mouth.   calcium-vitamin D (OSCAL WITH D) 500-200 MG-UNIT tablet Take 1 tablet by mouth.   diclofenac sodium (VOLTAREN) 1 % GEL Apply 4 g topically 4 (four) times daily.   hydrochlorothiazide (HYDRODIURIL) 50 MG tablet Take 1 tablet (50 mg total) by mouth every other day.   meloxicam (MOBIC) 7.5  MG tablet Take 1 tablet (7.5 mg total) by mouth daily as needed.   mometasone (NASONEX) 50 MCG/ACT nasal spray Place 2 sprays into the nose daily.   potassium chloride SA (K-DUR,KLOR-CON) 20 MEQ tablet TK 1 T PO QD WF   raloxifene (EVISTA) 60 MG tablet Take 1 tablet (60 mg total) by mouth daily.   No facility-administered encounter medications on file as of 01/06/2022.    Patient Active Problem List   Diagnosis Date Noted   BMI 40.0-44.9, adult ([place]) 06/05/2018   Hypercholesteremia 06/05/2018   Advanced care planning/counseling discussion 05/30/2017   Tendonitis of foot 05/24/2016   Hypertension 05/23/2015   CSF leak from ear 05/13/2015   Osteopenia 05/13/2015   Morbid obesity ([place]) 05/13/2015    Conditions to be addressed/monitored:HTN, HLD, Osteoarthritis, and Obesity  Care Plan : RNCM: General Plan of Care (Adult) for Chronic Disease Management and Care Coordination Needs  Updates made by Vanita Ingles, RN since 01/06/2022 12:00 AM  Completed 01/06/2022   Problem: RNCM: Development of Plan of Care for Chronic Disease Management and Care Coordination Needs ( HTN, HLD, OA, Obesity) Resolved 01/06/2022  Priority: High     Long-Range Goal: RNCM: Effective Management  of Plan of Care for Chronic Disease Management and Care Coordination Needs ( HTN, HLD, OA, Obesity) Completed 01/06/2022  Start Date: 04/22/2021  Expected End Date: 04/22/2022  Priority: High  Note:   Current Barriers: 01-06-2022: Goals of care have been met and the care plan is being closed. The patient is doing well and denies any new issues at  this time. The patient knows to call for changes or new needs.  Knowledge Deficits related to plan of care for management of HTN, HLD, and obesity and OA  Chronic Disease Management support and education needs related to HTN, HLD, and obesity and OA  RNCM Clinical Goal(s):  Patient will verbalize understanding of plan for management of HTN, HLD, Osteoarthritis, and Obesity    verbalize basic understanding of HTN, HLD, and Osteoporosis disease process and self health management plan Obesity  take all medications exactly as prescribed and will call provider for medication related questions demonstrate understanding of rationale for each prescribed medication  attend all scheduled medical appointments: 12-14-2021 demonstrate improved and ongoing adherence to prescribed treatment plan for HTN, HLD, Osteoporosis, and Obesity as evidenced by adherence to prescribed medication regimen contacting provider for new or worsened symptoms or questions  demonstrate a decrease in HTN, HLD, Osteoarthritis, and Obesity exacerbations  demonstrate ongoing self health care management ability for effective management of chronic conditions  through collaboration with RN Care manager, provider, and care team.   Interventions: 1:1 collaboration with primary care provider regarding development and update of comprehensive plan of care as evidenced by provider attestation and co-signature Inter-disciplinary care team collaboration (see longitudinal plan of care) Evaluation of current treatment plan related to  self management and patient's adherence to plan as established by provider   SDOH Barriers (Status: Goal Met.) Long Term Goal (>30 days)01-06-2022: Goals met and care plan is being closed  Patient interviewed and SDOH assessment performed        Patient interviewed and appropriate assessments performed Provided patient with information about resources available in [facility] and care guides to help with any questions and concerns or changes in SDOH Discussed plans with patient for ongoing care management follow up and provided patient with direct contact information for care management team Advised patient to call the office for changes in SDOH, Questions, or concerns No SDOH needs noted today. Patient has all needs met (10-14-2021)    Hyperlipidemia:  (Status: Goal on Track  (progressing): YES. Goal Met.)01-06-2022: Goals met and care plan is being closed  Lab Results  Component Value Date   CHOL 149 12/14/2021   HDL 61 12/14/2021   LDLCALC 70 12/14/2021   TRIG 98 12/14/2021   CHOLHDL 4.0 06/03/2017     Medication review performed; medication list updated in electronic medical record. 10-14-2021: The patient takes Atorvastatin 20 mg QD Provider established cholesterol goals reviewed. 10-14-2021: The patient is at goal. The patient is doing well with maintaining cholesterol levels; Counseled on importance of regular laboratory monitoring as prescribed. 08-19-2021: Review of recent lab work. Patient is at goal. Praised for good results. 10-14-2021: Lab work is current.  Provided HLD educational materials; Reviewed role and benefits of statin for ASCVD risk reduction; Discussed strategies to manage statin-induced myalgias; Reviewed importance of limiting foods high in cholesterol. 10-14-2021: Review and education provided. The patient is trying to lose weight. States it is really hard but she is cutting back on her portion sizes. She states that she wants to lose more weight. Education on being more active to help with her weight loss and monitoring portions.  Screening for signs and symptoms of depression related to chronic disease state;  Assessed social determinant of health barriers;   Hypertension: (Status: Goal Met.) 01-06-2022:Goals met and care plan is being closed  Last practice recorded BP readings:  BP Readings from Last 3 Encounters:  12/14/21 135/83  11/27/21 133/69  06/23/21  115/72  Most recent eGFR/CrCl:  Lab Results  Component Value Date   EGFR 66 06/15/2021    No components found for: CRCL 4--19-2023: Self reported blood pressure from 10-13-2021 was 120/75 Evaluation of current treatment plan related to hypertension self management and patient's adherence to plan as established by provider. 06-24-2021: The patient is doing well with management of  blood pressures. The patient states that the only issue she is having right now is an URI. PCP thinks she has RSV. See individual plan of care for RSV/Respiratory. 08-19-2021: The patient is doing well with HTN and heart health. Denies any issues. The patient is recovered from [place] that she had in December 2022. No issues noted at this time post COVID.   10-14-2021: The patient is doing well. Denies any new concerns. No further issues from breast concerns and no issues post COVID. The patient is happy that her conditions are stable and is looking forward to vacation in June to [facility]. Will continue to monitor for changes or needs.  Provided education to patient re: stroke prevention, s/s of heart attack and stroke; Reviewed prescribed diet heart healthy. 10-14-2021: The patient is compliant with heart healthy diet, is working on portion control and losing weight Reviewed medications with patient and discussed importance of compliance. 10-14-2021: The patient is compliant with medications. ;  Discussed plans with patient for ongoing care management follow up and provided patient with direct contact information for care management team; Advised patient, providing education and rationale, to monitor blood pressure daily and record, calling PCP for findings outside established parameters;  Reviewed scheduled/upcoming provider appointments including: 12-14-2021 Provided education on prescribed diet heart healthy;  Discussed complications of poorly controlled blood pressure such as heart disease, stroke, circulatory complications, vision complications, kidney impairment, sexual dysfunction;   Pain:  (Status: Goal Met.)Closing goal on 08-19-2021: The patient denies pain at this time. Will continue to monitor.  Pain assessment performed. 10-14-2021: Denies any pain at this time. Will continue to monitor. Medications reviewed. 10-14-2021: Knows how to effectively manage pain and discomfort when needed.  Reviewed  provider established plan for pain management; Discussed importance of adherence to all scheduled medical appointments; Counseled on the importance of reporting any/all new or changed pain symptoms or management strategies to pain management provider; Advised patient to report to care team affect of pain on daily activities; Discussed use of relaxation techniques and/or diversional activities to assist with pain reduction (distraction, imagery, relaxation, massage, acupressure, TENS, heat, and cold application; Reviewed with patient prescribed pharmacological and nonpharmacological pain relief strategies;   Weight Loss:  (Status: Goal Met.)01-06-2022: Goals met and care plan is being closed  Advised patient to discuss with primary care provider options regarding weight management;  Collaboration with provider for dietician referral;  Provided patient and/or caregiver with contact information about care guides Advice worker or dietician); Provided verbal and/or written education to patient re: provider recommended life style modifications. 06-24-2021: the patient has been dealing with an upper respiratory infection and is starting medications today. States that [facility] does not have one of the medications the cough syrup and is supposed to get it today. 08-19-2021: The patient is watching what she eats. She is a little nervous right now as she had a breast biopsy yesterday. The patient will be glad when she gets the results back. 10-14-2021: Everything checked out fine with breast biopsy. She states she is thankful for this. Has no new issues. Is focusing on portion control and being more active.  Enjoys being outside when it is pretty outside. Tips for staying hydrated and portion control. Will continue to monitor. The patient will be seeing pcp in June and will also be going on vacation to [facility]. Denies any acute findings at this time.  ;  Screening for signs and symptoms of depression;   Offered to connect patient with psychology or social work support for counseling and supportive care;  Reviewed recommended dietary changes: avoid fad diets, make small/incremental dietary and exercise changes, eat at the table and avoid eating in front of the TV, plan management of cravings, monitor snacking and cravings in food diary;   RSV/Upper Respiratory infection   (Status: Goal Met.) Short Term Goal  Added on 06-24-2021. Goal being closed at this time. The patient had COVID and is now doing much better. The patient states that she is not having any issue post COVID. Education on monitoring for hair loss and changes.  Evaluation of current treatment plan related to  RSV, Upper Respiratory Infection ,  self-management and patient's adherence to plan as established by provider. Discussed plans with patient for ongoing care management follow up and provided patient with direct contact information for care management team Advised patient to call the office for changes in respiratory status, fever, or chills. ; Provided education to patient re: sipping on honey mixed in warm water to soothe the throat and help with cough; Reviewed medications with patient and discussed The patient states her brother has gone to [facility] to [facility] up her medications. They do not have the tussinex and it is supposed to be in today. The patient is going to take the prednisone and tessalon when she gets it. Education on how to take and review of medications. ; Provided patient with sx and sx to monitor for changes in respiratory status, general information educational materials related to RSV, upper respiratory infection ; Reviewed scheduled/upcoming provider appointments including the patient is supposed to call the office tomorrow to check in with pcp concerning the status of current respiratory condition. Patient  has a regular appointment for 12-14-2021; Discussed plans with patient for ongoing care management follow  up and provided patient with direct contact information for care management team; Advised patient to discuss new sx and sx, and progression of respiratory status with provider;  06-24-2021: Incoming call from the patient after getting an alert about test results. The patient is concerned about positive COVID test and wanted to know if she needed to do anything else as recommended by the pcp. Review of universal precautions and isolation per CDC guidelines. Also have sent an in basket message to the pcp asking if there are any other recommendations from the pcp for the patient. Will correspond with the patient any communication received from the pcp. The patient has started taking prednisone and states she feels better. Will continue to monitor and advise accordingly.    Right breast calcifications with biopsy on 08-18-2021  (Status: Goal Met.) Short Term Goal Closing goal- biopsy negative for issues Evaluation of current treatment plan related to  right breast with calcifications with biopsy on 08-18-2021 ,  self-management and patient's adherence to plan as established by provider. 10-14-2021: The patient is happy to report there were no new concerns with her breast calcifications and the biopsy was benign. She is thankful for this. Will continue to monitor for changes. Goal closed today. Will revisit in the future for new problems or concerns related to breast health and care.  Discussed plans with  patient for ongoing care management follow up and provided patient with direct contact information for care management team Advised patient to monitor for sx and sx at the site of biopsy of breast tissue and to report fever, chills, changes in site condition, abnormal drainage from the area; Provided education to patient re: to thing positive, work with providers to get results in a timely manner and call the office for questions or concerns.  The patient is optimistic and feels good about the awaiting results.  She is hopeful there is no reason to be concerned or need additional follow up for calcifications found on her mammogram with biopsy on 08-18-2021; Discussed plans with patient for ongoing care management follow up and provided patient with direct contact information for care management team; Advised patient to discuss question, concerns, and biopsy results from the biopsy with provider; Screening for signs and symptoms of depression related to chronic disease state;  Assessed social determinant of health barriers;  Emotional support and education given. Will continue to monitor for changes.     Patient Goals/Self-Care Activities: Patient will self administer medications as prescribed as evidenced by self report/primary caregiver report  Patient will attend all scheduled provider appointments as evidenced by clinician review of documented attendance to scheduled appointments and patient/caregiver report Patient will call pharmacy for medication refills as evidenced by patient report and review of pharmacy fill history as appropriate Patient will attend church or other social activities as evidenced by patient report Patient will continue to perform ADL's independently as evidenced by patient/caregiver report Patient will continue to perform IADL's independently as evidenced by patient/caregiver report Patient will call provider office for new concerns or questions as evidenced by review of documented incoming telephone call notes and patient report Patient will work with BSW to address care coordination needs and will continue to work with the clinical team to address health care and disease management related needs as evidenced by documented adherence to scheduled care management/care coordination appointments - check blood pressure 3 times per week - check blood pressure daily - check blood pressure weekly - choose a place to take my blood pressure (home, clinic or office, retail store) - write  blood pressure results in a log or diary - learn about high blood pressure - keep a blood pressure log - take blood pressure log to all doctor appointments - call doctor for signs and symptoms of high blood pressure - develop an action plan for high blood pressure - keep all doctor appointments - take medications for blood pressure exactly as prescribed - report new symptoms to your doctor - eat more whole grains, fruits and vegetables, lean meats and healthy fats - call for medicine refill 2 or 3 days before it runs out - take all medications exactly as prescribed - call doctor with any symptoms you believe are related to your medicine - call doctor when you experience any new symptoms - go to all doctor appointments as scheduled - adhere to prescribed diet: heart healthy diet        Plan:No further follow up required: the patient has met the goals of care and the care plan is being closed.   [name] RN, MSN, [facility] Mobile: [phone]

## 2022-01-06 NOTE — Patient Instructions (Signed)
Visit Information  Thank you for taking time to visit with me today. Please don't hesitate to contact me if I can be of assistance to you before our next scheduled telephone appointment.  Following are the goals we discussed today:  Current Barriers: 01-06-2022: Goals of care have been met and the care plan is being closed. The patient is doing well and denies any new issues at this time. The patient knows to call for changes or new needs.  Knowledge Deficits related to plan of care for management of HTN, HLD, and obesity and OA  Chronic Disease Management support and education needs related to HTN, HLD, and obesity and OA   RNCM Clinical Goal(s):  Patient will verbalize understanding of plan for management of HTN, HLD, Osteoarthritis, and Obesity   verbalize basic understanding of HTN, HLD, and Osteoporosis disease process and self health management plan Obesity  take all medications exactly as prescribed and will call provider for medication related questions demonstrate understanding of rationale for each prescribed medication  attend all scheduled medical appointments: 12-14-2021 demonstrate improved and ongoing adherence to prescribed treatment plan for HTN, HLD, Osteoporosis, and Obesity as evidenced by adherence to prescribed medication regimen contacting provider for new or worsened symptoms or questions  demonstrate a decrease in HTN, HLD, Osteoarthritis, and Obesity exacerbations  demonstrate ongoing self health care management ability for effective management of chronic conditions  through collaboration with RN Care manager, provider, and care team.    Interventions: 1:1 collaboration with primary care provider regarding development and update of comprehensive plan of care as evidenced by provider attestation and co-signature Inter-disciplinary care team collaboration (see longitudinal plan of care) Evaluation of current treatment plan related to  self management and patient's  adherence to plan as established by provider     SDOH Barriers (Status: Goal Met.) Long Term Goal (>30 days)01-06-2022: Goals met and care plan is being closed  Patient interviewed and SDOH assessment performed        Patient interviewed and appropriate assessments performed Provided patient with information about resources available in [facility] and care guides to help with any questions and concerns or changes in SDOH Discussed plans with patient for ongoing care management follow up and provided patient with direct contact information for care management team Advised patient to call the office for changes in SDOH, Questions, or concerns No SDOH needs noted today. Patient has all needs met (10-14-2021)       Hyperlipidemia:  (Status: Goal on Track (progressing): YES. Goal Met.)01-06-2022: Goals met and care plan is being closed       Lab Results  Component Value Date    CHOL 149 12/14/2021    HDL 61 12/14/2021    LDLCALC 70 12/14/2021    TRIG 98 12/14/2021    CHOLHDL 4.0 06/03/2017      Medication review performed; medication list updated in electronic medical record. 10-14-2021: The patient takes Atorvastatin 20 mg QD Provider established cholesterol goals reviewed. 10-14-2021: The patient is at goal. The patient is doing well with maintaining cholesterol levels; Counseled on importance of regular laboratory monitoring as prescribed. 08-19-2021: Review of recent lab work. Patient is at goal. Praised for good results. 10-14-2021: Lab work is current.  Provided HLD educational materials; Reviewed role and benefits of statin for ASCVD risk reduction; Discussed strategies to manage statin-induced myalgias; Reviewed importance of limiting foods high in cholesterol. 10-14-2021: Review and education provided. The patient is trying to lose weight. States it is really hard but  she is cutting back on her portion sizes. She states that she wants to lose more weight. Education on being more  active to help with her weight loss and monitoring portions.  Screening for signs and symptoms of depression related to chronic disease state;  Assessed social determinant of health barriers;    Hypertension: (Status: Goal Met.) 01-06-2022:Goals met and care plan is being closed  Last practice recorded BP readings:     BP Readings from Last 3 Encounters:  12/14/21 135/83  11/27/21 133/69  06/23/21 115/72  Most recent eGFR/CrCl:       Lab Results  Component Value Date    EGFR 66 06/15/2021    No components found for: CRCL 4--19-2023: Self reported blood pressure from 10-13-2021 was 120/75 Evaluation of current treatment plan related to hypertension self management and patient's adherence to plan as established by provider. 06-24-2021: The patient is doing well with management of blood pressures. The patient states that the only issue she is having right now is an URI. PCP thinks she has RSV. See individual plan of care for RSV/Respiratory. 08-19-2021: The patient is doing well with HTN and heart health. Denies any issues. The patient is recovered from [place] that she had in December 2022. No issues noted at this time post COVID.   10-14-2021: The patient is doing well. Denies any new concerns. No further issues from breast concerns and no issues post COVID. The patient is happy that her conditions are stable and is looking forward to vacation in June to [facility]. Will continue to monitor for changes or needs.  Provided education to patient re: stroke prevention, s/s of heart attack and stroke; Reviewed prescribed diet heart healthy. 10-14-2021: The patient is compliant with heart healthy diet, is working on portion control and losing weight Reviewed medications with patient and discussed importance of compliance. 10-14-2021: The patient is compliant with medications. ;  Discussed plans with patient for ongoing care management follow up and provided patient with direct contact information for care  management team; Advised patient, providing education and rationale, to monitor blood pressure daily and record, calling PCP for findings outside established parameters;  Reviewed scheduled/upcoming provider appointments including: 12-14-2021 Provided education on prescribed diet heart healthy;  Discussed complications of poorly controlled blood pressure such as heart disease, stroke, circulatory complications, vision complications, kidney impairment, sexual dysfunction;    Pain:  (Status: Goal Met.)Closing goal on 08-19-2021: The patient denies pain at this time. Will continue to monitor.  Pain assessment performed. 10-14-2021: Denies any pain at this time. Will continue to monitor. Medications reviewed. 10-14-2021: Knows how to effectively manage pain and discomfort when needed.  Reviewed provider established plan for pain management; Discussed importance of adherence to all scheduled medical appointments; Counseled on the importance of reporting any/all new or changed pain symptoms or management strategies to pain management provider; Advised patient to report to care team affect of pain on daily activities; Discussed use of relaxation techniques and/or diversional activities to assist with pain reduction (distraction, imagery, relaxation, massage, acupressure, TENS, heat, and cold application; Reviewed with patient prescribed pharmacological and nonpharmacological pain relief strategies;     Weight Loss:  (Status: Goal Met.)01-06-2022: Goals met and care plan is being closed  Advised patient to discuss with primary care provider options regarding weight management;  Collaboration with provider for dietician referral;  Provided patient and/or caregiver with contact information about care guides Advice worker or dietician); Provided verbal and/or written education to patient re: provider recommended life  style modifications. 06-24-2021: the patient has been dealing with an upper respiratory  infection and is starting medications today. States that [facility] does not have one of the medications the cough syrup and is supposed to get it today. 08-19-2021: The patient is watching what she eats. She is a little nervous right now as she had a breast biopsy yesterday. The patient will be glad when she gets the results back. 10-14-2021: Everything checked out fine with breast biopsy. She states she is thankful for this. Has no new issues. Is focusing on portion control and being more active. Enjoys being outside when it is pretty outside. Tips for staying hydrated and portion control. Will continue to monitor. The patient will be seeing pcp in June and will also be going on vacation to [facility]. Denies any acute findings at this time.  ;  Screening for signs and symptoms of depression;  Offered to connect patient with psychology or social work support for counseling and supportive care;  Reviewed recommended dietary changes: avoid fad diets, make small/incremental dietary and exercise changes, eat at the table and avoid eating in front of the TV, plan management of cravings, monitor snacking and cravings in food diary;     RSV/Upper Respiratory infection   (Status: Goal Met.) Short Term Goal  Added on 06-24-2021. Goal being closed at this time. The patient had COVID and is now doing much better. The patient states that she is not having any issue post COVID. Education on monitoring for hair loss and changes.  Evaluation of current treatment plan related to  RSV, Upper Respiratory Infection ,  self-management and patient's adherence to plan as established by provider. Discussed plans with patient for ongoing care management follow up and provided patient with direct contact information for care management team Advised patient to call the office for changes in respiratory status, fever, or chills. ; Provided education to patient re: sipping on honey mixed in warm water to soothe the throat and help  with cough; Reviewed medications with patient and discussed The patient states her [facility] has [facility]. They do not have the tussinex and it is supposed to be in today. The patient is going to take the prednisone and tessalon when she gets it. Education on how to take and review of medications. ; Provided patient with sx and sx to monitor for changes in respiratory status, general information educational materials related to RSV, upper respiratory infection ; Reviewed scheduled/upcoming provider appointments including the patient is supposed to call the office tomorrow to check in with pcp concerning the status of current respiratory condition. Patient  has a regular appointment for 12-14-2021; Discussed plans with patient for ongoing care management follow up and provided patient with direct contact information for care management team; Advised patient to discuss new sx and sx, and progression of respiratory status with provider;  06-24-2021: Incoming call from the patient after getting an alert about test results. The patient is concerned about positive COVID test and wanted to know if she needed to do anything else as recommended by the pcp. Review of universal precautions and isolation per CDC guidelines. Also have sent an in basket message to the pcp asking if there are any other recommendations from the pcp for the patient. Will correspond with the patient any communication received from the pcp. The patient has started taking prednisone and states she feels better. Will continue to monitor and advise accordingly.  Right breast calcifications with biopsy on 08-18-2021  (Status: Goal Met.) Short Term Goal Closing goal- biopsy negative for issues Evaluation of current treatment plan related to  right breast with calcifications with biopsy on 08-18-2021 ,  self-management and patient's adherence to plan as established by provider. 10-14-2021: The patient is happy to  report there were no new concerns with her breast calcifications and the biopsy was benign. She is thankful for this. Will continue to monitor for changes. Goal closed today. Will revisit in the future for new problems or concerns related to breast health and care.  Discussed plans with patient for ongoing care management follow up and provided patient with direct contact information for care management team Advised patient to monitor for sx and sx at the site of biopsy of breast tissue and to report fever, chills, changes in site condition, abnormal drainage from the area; Provided education to patient re: to thing positive, work with providers to get results in a timely manner and call the office for questions or concerns.  The patient is optimistic and feels good about the awaiting results. She is hopeful there is no reason to be concerned or need additional follow up for calcifications found on her mammogram with biopsy on 08-18-2021; Discussed plans with patient for ongoing care management follow up and provided patient with direct contact information for care management team; Advised patient to discuss question, concerns, and biopsy results from the biopsy with provider; Screening for signs and symptoms of depression related to chronic disease state;  Assessed social determinant of health barriers;  Emotional support and education given. Will continue to monitor for changes.      Patient Goals/Self-Care Activities: Patient will self administer medications as prescribed as evidenced by self report/primary caregiver report  Patient will attend all scheduled provider appointments as evidenced by clinician review of documented attendance to scheduled appointments and patient/caregiver report Patient will call pharmacy for medication refills as evidenced by patient report and review of pharmacy fill history as appropriate Patient will attend church or other social activities as evidenced by patient  report Patient will continue to perform ADL's independently as evidenced by patient/caregiver report Patient will continue to perform IADL's independently as evidenced by patient/caregiver report Patient will call provider office for new concerns or questions as evidenced by review of documented incoming telephone call notes and patient report Patient will work with BSW to address care coordination needs and will continue to work with the clinical team to address health care and disease management related needs as evidenced by documented adherence to scheduled care management/care coordination appointments - check blood pressure 3 times per week - check blood pressure daily - check blood pressure weekly - choose a place to take my blood pressure (home, clinic or office, retail store) - write blood pressure results in a log or diary - learn about high blood pressure - keep a blood pressure log - take blood pressure log to all doctor appointments - call doctor for signs and symptoms of high blood pressure - develop an action plan for high blood pressure - keep all doctor appointments - take medications for blood pressure exactly as prescribed - report new symptoms to your doctor - eat more whole grains, fruits and vegetables, lean meats and healthy fats - call for medicine refill 2 or 3 days before it runs out - take all medications exactly as prescribed - call doctor with any symptoms you believe are related to your medicine - call doctor when  you experience any new symptoms - go to all doctor appointments as scheduled - adhere to prescribed diet: heart healthy diet   ) No further follow up required. The patient has met the goals of care. Care plan is closed. Knows to call for new concerns or questions.  Please call the care guide team at [phone] if you need to schedule an appointment.   If you are experiencing a Mental Health or [place] or need someone to talk to,  please call the Suicide and Crisis Lifeline: 988 call the [place] National Suicide Prevention Lifeline: 564-390-4645 or TTY: 858-048-6076 TTY 252 318 7047) to talk to a trained counselor call 1-800-273-TALK (toll free, 24 hour hotline)   Patient verbalizes understanding of instructions and care plan provided today and agrees to view in [facility]. Active MyChart status and patient understanding of how to access instructions and care plan via MyChart confirmed with patient.     [name] RN, MSN, [facility] Mobile: [phone]

## 2022-01-11 ENCOUNTER — Encounter: Payer: PPO | Admitting: Family Medicine

## 2022-01-25 DIAGNOSIS — E7800 Pure hypercholesterolemia, unspecified: Secondary | ICD-10-CM

## 2022-01-25 DIAGNOSIS — I10 Essential (primary) hypertension: Secondary | ICD-10-CM

## 2022-01-25 DIAGNOSIS — M19041 Primary osteoarthritis, right hand: Secondary | ICD-10-CM

## 2022-01-25 DIAGNOSIS — M19042 Primary osteoarthritis, left hand: Secondary | ICD-10-CM | POA: Diagnosis not present

## 2022-02-09 DIAGNOSIS — L821 Other seborrheic keratosis: Secondary | ICD-10-CM | POA: Diagnosis not present

## 2022-02-09 DIAGNOSIS — L570 Actinic keratosis: Secondary | ICD-10-CM | POA: Diagnosis not present

## 2022-02-09 DIAGNOSIS — D485 Neoplasm of uncertain behavior of skin: Secondary | ICD-10-CM | POA: Diagnosis not present

## 2022-02-22 DIAGNOSIS — M1712 Unilateral primary osteoarthritis, left knee: Secondary | ICD-10-CM | POA: Diagnosis not present

## 2022-02-26 ENCOUNTER — Other Ambulatory Visit: Payer: Self-pay | Admitting: Family Medicine

## 2022-02-26 NOTE — Telephone Encounter (Signed)
Medication Refill - Medication:  atorvastatin (LIPITOR) 20 MG tablet  Has the patient contacted their pharmacy? Yes.   (Agent: If yes, when and what did the pharmacy advise?) call provider's office  Preferred Pharmacy (with phone number or street name):  [facility], [place] - [address] Phone:  [phone]  Fax:  [phone]     Has the patient been seen for an appointment in the last year OR does the patient have an upcoming appointment? Yes.    Agent: Please be advised that RX refills may take up to 3 business days. We ask that you follow-up with your pharmacy.

## 2022-03-02 DIAGNOSIS — S83242A Other tear of medial meniscus, current injury, left knee, initial encounter: Secondary | ICD-10-CM | POA: Diagnosis not present

## 2022-03-02 MED ORDER — ATORVASTATIN CALCIUM 20 MG PO TABS: ORAL_TABLET | ORAL | 1 refills | Status: DC

## 2022-03-02 NOTE — Telephone Encounter (Signed)
Requested Prescriptions  Pending Prescriptions Disp Refills  . atorvastatin (LIPITOR) 20 MG tablet 90 tablet 1    Sig: TAKE 1 TABLET(20 MG) BY MOUTH DAILY Strength: 20 mg     Cardiovascular:  Antilipid - Statins Failed - 02/26/2022 12:12 PM      Failed - Lipid Panel in normal range within the last 12 months    Cholesterol, Total  Date Value Ref Range Status  12/14/2021 149 100 - 199 mg/dL Final   LDL Chol Calc (NIH)  Date Value Ref Range Status  12/14/2021 70 0 - 99 mg/dL Final   HDL  Date Value Ref Range Status  12/14/2021 61 >39 mg/dL Final   Triglycerides  Date Value Ref Range Status  12/14/2021 98 0 - 149 mg/dL Final         Passed - Patient is not pregnant      Passed - Valid encounter within last 12 months    Recent Outpatient Visits          2 months ago Routine general medical examination at a health care facility   [facility], Robinson P, DO   3 months ago Acute gout involving toe of right foot, unspecified cause   [facility] Jon Billings, NP   8 months ago Cough, unspecified type   [place], [place], DO   8 months ago Primary hypertension   [place], [place], DO   1 year ago Primary hypertension   [place], [place], DO      Future Appointments            In 3 months Wynetta Emery, Barb Merino, DO [employer] [facility], [place]   In 9 months Wynetta Emery, Barb Merino, DO [employer] [facility], [facility]

## 2022-03-11 DIAGNOSIS — M25562 Pain in left knee: Secondary | ICD-10-CM | POA: Diagnosis not present

## 2022-04-29 DIAGNOSIS — L309 Dermatitis, unspecified: Secondary | ICD-10-CM | POA: Diagnosis not present

## 2022-05-24 DIAGNOSIS — Z85828 Personal history of other malignant neoplasm of skin: Secondary | ICD-10-CM | POA: Diagnosis not present

## 2022-05-24 DIAGNOSIS — D2261 Melanocytic nevi of right upper limb, including shoulder: Secondary | ICD-10-CM | POA: Diagnosis not present

## 2022-05-24 DIAGNOSIS — D2272 Melanocytic nevi of left lower limb, including hip: Secondary | ICD-10-CM | POA: Diagnosis not present

## 2022-05-24 DIAGNOSIS — X32XXXA Exposure to sunlight, initial encounter: Secondary | ICD-10-CM | POA: Diagnosis not present

## 2022-05-24 DIAGNOSIS — B078 Other viral warts: Secondary | ICD-10-CM | POA: Diagnosis not present

## 2022-05-24 DIAGNOSIS — L309 Dermatitis, unspecified: Secondary | ICD-10-CM | POA: Diagnosis not present

## 2022-05-24 DIAGNOSIS — D485 Neoplasm of uncertain behavior of skin: Secondary | ICD-10-CM | POA: Diagnosis not present

## 2022-05-24 DIAGNOSIS — L570 Actinic keratosis: Secondary | ICD-10-CM | POA: Diagnosis not present

## 2022-06-15 ENCOUNTER — Ambulatory Visit (INDEPENDENT_AMBULATORY_CARE_PROVIDER_SITE_OTHER): Payer: PPO | Admitting: Family Medicine

## 2022-06-15 ENCOUNTER — Encounter: Payer: Self-pay | Admitting: Family Medicine

## 2022-06-15 VITALS — BP 123/78 | HR 77 | Temp 98.4°F | Ht 62.5 in | Wt 238.5 lb

## 2022-06-15 DIAGNOSIS — Z0000 Encounter for general adult medical examination without abnormal findings: Secondary | ICD-10-CM | POA: Diagnosis not present

## 2022-06-15 DIAGNOSIS — I10 Essential (primary) hypertension: Secondary | ICD-10-CM

## 2022-06-15 DIAGNOSIS — E7800 Pure hypercholesterolemia, unspecified: Secondary | ICD-10-CM | POA: Diagnosis not present

## 2022-06-15 DIAGNOSIS — L509 Urticaria, unspecified: Secondary | ICD-10-CM

## 2022-06-15 DIAGNOSIS — Z1231 Encounter for screening mammogram for malignant neoplasm of breast: Secondary | ICD-10-CM

## 2022-06-15 MED ORDER — ATORVASTATIN CALCIUM 20 MG PO TABS: ORAL_TABLET | ORAL | 1 refills | Status: DC

## 2022-06-15 MED ORDER — HYDROCHLOROTHIAZIDE 50 MG PO TABS: 50.0000 mg | ORAL_TABLET | ORAL | 1 refills | Status: DC

## 2022-06-15 MED ORDER — RALOXIFENE HCL 60 MG PO TABS: 60.0000 mg | ORAL_TABLET | Freq: Every day | ORAL | 3 refills | Status: DC

## 2022-06-15 MED ORDER — ALBUTEROL SULFATE HFA 108 (90 BASE) MCG/ACT IN AERS: 2.0000 | INHALATION_SPRAY | Freq: Four times a day (QID) | RESPIRATORY_TRACT | 6 refills | Status: DC | PRN

## 2022-06-15 NOTE — Progress Notes (Signed)
BP 123/78 (BP Location: Left Arm, Cuff Size: Normal)   Pulse 77   Temp 98.4 F (36.9 C) (Oral)   Ht 5' 2.5" (1.588 m)   Wt 238 lb 8 oz (108.2 kg)   LMP  (LMP Unknown)   SpO2 98%   BMI 42.93 kg/m    Subjective:    Patient ID: Heather James, female    DOB: [birth year], 71 y.o.   MRN: [id number]  HPI: Heather James is a 71 y.o. female presenting on 06/15/2022 for comprehensive medical examination. Current medical complaints include:  RASH Duration:  about 4 months  Location: back  Itching: yes Burning: no Redness: yes Oozing: no Scaling: no Blisters: no Painful: no Fevers: no Change in detergents/soaps/personal care products: no Recent illness: no Recent travel:no History of same: no Context: fluctuating Alleviating factors: clobetasol Treatments attempted:clobetasol Shortness of breath: no  Throat/tongue swelling: no Myalgias/arthralgias: no   HYPERTENSION / HYPERLIPIDEMIA Satisfied with current treatment? yes Duration of hypertension: chronic BP monitoring frequency: not checking BP medication side effects: no Past BP meds: HCTZ Duration of hyperlipidemia: chronic Cholesterol medication side effects: no Cholesterol supplements: none Past cholesterol medications: atorvastatin Medication compliance: excellent compliance Aspirin: yes Recent stressors: no Recurrent headaches: no Visual changes: no Palpitations: no Dyspnea: no Chest pain: no Lower extremity edema: no Dizzy/lightheaded: no  Menopausal Symptoms: no  Functional Status Survey: Is the patient deaf or have difficulty hearing?: No Does the patient have difficulty seeing, even when wearing glasses/contacts?: No Does the patient have difficulty concentrating, remembering, or making decisions?: No Does the patient have difficulty walking or climbing stairs?: No Does the patient have difficulty dressing or bathing?: No Does the patient have difficulty doing errands alone such as visiting  a doctor's office or shopping?: No     06/15/2022    8:41 AM 12/14/2021    8:10 AM 11/27/2021    1:30 PM 06/23/2021    3:10 PM 06/11/2021   12:04 PM  [place] in the past year? 0 0 0 0 0  Number falls in past yr: 0 0 0 0 0  Injury with Fall? 0 0 0 0 0  Risk for fall due to : No Fall Risks No Fall Risks No Fall Risks No Fall Risks   Follow up _0 ;Falls prevention discussed;Education provided    Depression Screen    06/15/2022    8:41 AM 12/14/2021    8:10 AM 11/27/2021    1:30 PM 06/23/2021    3:10 PM 06/15/2021    8:27 AM  Depression screen PHQ 2/9  Decreased Interest 0 0 0 0 0  Down, Depressed, Hopeless 0 0 0 0 0  PHQ - 2 Score 0 0 0 0 0  Altered sleeping 0 0 0 0 0  Tired, decreased energy 0 0 0 0 0  Change in appetite 0 0 0 0 0  Feeling bad or failure about yourself  0 0 0 0 0  Trouble concentrating 0 0 0 0 0  Moving slowly or fidgety/restless 0 0 0 0 0  Suicidal thoughts 0 0 0 0 0  PHQ-9 Score 0 0 0 0 0  Difficult doing work/chores Not difficult at all Not difficult at all Not difficult at all Not difficult at all    Advanced Directives Does patient have a HCPOA?    yes If yes, name and contact information- will bring in  a copy Does patient have a living will or MOST form?  no  Past Medical History:  Past Medical History:  Diagnosis Date   Cancer ([place])    skin ca on leg   CSF leak from ear    Hypercholesteremia    Hypertension    Obesity    Osteopenia     Surgical History:  Past Surgical History:  Procedure Laterality Date   BREAST BIOPSY Left 11/21/1996   neg   BREAST BIOPSY Right 08/18/2021   affirm bx 2 area , x marker, path pending   BREAST SURGERY Left    cyst removed   COLONOSCOPY WITH PROPOFOL N/A 03/14/2018   Procedure: COLONOSCOPY WITH PROPOFOL;  Surgeon: Toledo, Benay Pike, MD;  Location: [facility] ENDOSCOPY;   Service: Gastroenterology;  Laterality: N/A;   FACIAL NERVE SURGERY     PILONIDAL CYST EXCISION     SKIN CANCER EXCISION Right    right leg, April 2017    Medications:  Current Outpatient Medications on File Prior to Visit  Medication Sig   aspirin 81 MG tablet Take 81 mg by mouth daily.   calcium carbonate (OS-CAL - DOSED IN MG OF ELEMENTAL CALCIUM) 1250 (500 Ca) MG tablet Take 1 tablet by mouth.   calcium-vitamin D (OSCAL WITH D) 500-200 MG-UNIT tablet Take 1 tablet by mouth.   diclofenac sodium (VOLTAREN) 1 % GEL Apply 4 g topically 4 (four) times daily.   meloxicam (MOBIC) 7.5 MG tablet Take 1 tablet (7.5 mg total) by mouth daily as needed.   mometasone (NASONEX) 50 MCG/ACT nasal spray Place 2 sprays into the nose daily.   potassium chloride SA (K-DUR,KLOR-CON) 20 MEQ tablet TK 1 T PO QD WF   No current facility-administered medications on file prior to visit.    Allergies:  No Known Allergies  Social History:  Social History   Socioeconomic History   Marital status: Divorced    Spouse name: Not on file   Number of children: Not on file   Years of education: 12   Highest education level: 12th grade  Occupational History   Occupation: retired  Tobacco Use   Smoking status: Never   Smokeless tobacco: Never  Scientific laboratory technician Use: Never used  Substance and Sexual Activity   Alcohol use: No    Alcohol/week: 0.0 standard drinks of alcohol   Drug use: No   Sexual activity: Not Currently    Birth control/protection: None  Other Topics Concern   Not on file  Social History Narrative   Not on file   Social Determinants of Health   Financial Resource Strain: Low Risk  (06/11/2021)   Overall Financial Resource Strain (CARDIA)    Difficulty of Paying Living Expenses: Not hard at all  Food Insecurity: No Food Insecurity (06/11/2021)   Hunger Vital Sign    Worried About Running Out of Food in the Last Year: Never true    [place] in the Last Year: Never true   Transportation Needs: No Transportation Needs (06/11/2021)   PRAPARE - Hydrologist (Medical): No    Lack of Transportation (Non-Medical): No  Physical Activity: Insufficiently Active (06/11/2021)   Exercise Vital Sign    Days of Exercise per Week: 3 days    Minutes of Exercise per Session: 40 min  Stress: No Stress Concern Present (06/11/2021)   [place]    Feeling of Stress : Not at  all  Social Connections: Moderately Isolated (06/11/2021)   Social Connection and Isolation Panel [NHANES]    Frequency of Communication with Friends and Family: More than three times a week    Frequency of Social Gatherings with Friends and Family: More than three times a week    Attends Religious Services: More than 4 times per year    Active Member of [employer] or Organizations: No    Attends Archivist Meetings: Never    Marital Status: Divorced  Human resources officer Violence: Not At Risk (06/11/2021)   Humiliation, Afraid, Rape, and Kick questionnaire    Fear of Current or Ex-Partner: No    Emotionally Abused: No    Physically Abused: No    Sexually Abused: No   Social History   Tobacco Use  Smoking Status Never  Smokeless Tobacco Never   Social History   Substance and Sexual Activity  Alcohol Use No   Alcohol/week: 0.0 standard drinks of alcohol    Family History:  Family History  Problem Relation Age of Onset   Heart disease Mother    Diabetes Mother    Hypertension Mother    Breast cancer Mother 62   Cancer Mother    Heart disease Father    Diabetes Father    Hypertension Brother    Cancer Maternal Grandmother        spine   Heart disease Maternal Grandfather    Hearing loss Maternal Grandfather    Heart disease Paternal Grandmother    Emphysema Paternal Grandfather     Past medical history, surgical history, medications, allergies, family history and social history  reviewed with patient today and changes made to appropriate areas of the chart.   Review of Systems  Constitutional: Negative.   HENT: Negative.    Eyes: Negative.   Respiratory: Negative.    Cardiovascular: Negative.   Musculoskeletal: Negative.   Neurological: Negative.   Psychiatric/Behavioral: Negative.      All other ROS negative except what is listed above and in the HPI.      Objective:    BP 123/78 (BP Location: Left Arm, Cuff Size: Normal)   Pulse 77   Temp 98.4 F (36.9 C) (Oral)   Ht 5' 2.5" (1.588 m)   Wt 238 lb 8 oz (108.2 kg)   LMP  (LMP Unknown)   SpO2 98%   BMI 42.93 kg/m   Wt Readings from Last 3 Encounters:  06/15/22 238 lb 8 oz (108.2 kg)  12/14/21 241 lb 3.2 oz (109.4 kg)  11/27/21 234 lb 3.2 oz (106.2 kg)    Physical Exam Vitals and nursing note reviewed.  Constitutional:      General: She is not in acute distress.    Appearance: Normal appearance. She is obese. She is not ill-appearing, toxic-appearing or diaphoretic.  HENT:     Head: Normocephalic and atraumatic.     Right Ear: External ear normal.     Left Ear: External ear normal.     Nose: Nose normal.     Mouth/Throat:     Mouth: Mucous membranes are moist.     Pharynx: Oropharynx is clear.  Eyes:     General: No scleral icterus.       Right eye: No discharge.        Left eye: No discharge.     Extraocular Movements: Extraocular movements intact.     Conjunctiva/sclera: Conjunctivae normal.     Pupils: Pupils are equal, round, and reactive to light.  Cardiovascular:  Rate and Rhythm: Normal rate and regular rhythm.     Pulses: Normal pulses.     Heart sounds: Normal heart sounds. No murmur heard.    No friction rub. No gallop.  Pulmonary:     Effort: Pulmonary effort is normal. No respiratory distress.     Breath sounds: Normal breath sounds. No stridor. No wheezing, rhonchi or rales.  Chest:     Chest wall: No tenderness.  Musculoskeletal:        General: Normal range of  motion.     Cervical back: Normal range of motion and neck supple.  Skin:    General: Skin is warm and dry.     Capillary Refill: Capillary refill takes less than 2 seconds.     Coloration: Skin is not jaundiced or pale.     Findings: No bruising, erythema, lesion or rash.     Comments: Hives on her back  Neurological:     General: No focal deficit present.     Mental Status: She is alert and oriented to person, place, and time. Mental status is at baseline.  Psychiatric:        Mood and Affect: Mood normal.        Behavior: Behavior normal.        Thought Content: Thought content normal.        Judgment: Judgment normal.        06/15/2022    8:48 AM 06/09/2020    8:17 AM 06/07/2019    8:32 AM 06/01/2018    3:55 PM 05/27/2017    8:28 AM  6CIT Screen  What Year? 0 points 0 points 0 points 0 points 0 points  What month? 0 points 0 points 0 points 0 points 0 points  What time? 0 points 0 points 0 points 0 points 0 points  Count back from 20 0 points 0 points 0 points 0 points 0 points  Months in reverse 0 points 0 points 0 points 0 points 0 points  Repeat phrase 0 points 0 points 0 points 0 points 0 points  Total Score 0 points 0 points 0 points 0 points 0 points    Results for orders placed or performed in visit on 12/14/21  CBC with Differential/Platelet  Result Value Ref Range   WBC 6.0 3.4 - 10.8 x10E3/uL   RBC 5.14 3.77 - 5.28 x10E6/uL   Hemoglobin 15.6 11.1 - 15.9 g/dL   Hematocrit 47.3 (H) 34.0 - 46.6 %   MCV 92 79 - 97 fL   MCH 30.4 26.6 - 33.0 pg   MCHC 33.0 31.5 - 35.7 g/dL   RDW 13.5 11.7 - 15.4 %   Platelets 276 150 - 450 x10E3/uL   Neutrophils 68 Not Estab. %   Lymphs 21 Not Estab. %   Monocytes 8 Not Estab. %   Eos 2 Not Estab. %   Basos 1 Not Estab. %   Neutrophils Absolute 4.1 1.4 - 7.0 x10E3/uL   Lymphocytes Absolute 1.3 0.7 - 3.1 x10E3/uL   Monocytes Absolute 0.5 0.1 - 0.9 x10E3/uL   EOS (ABSOLUTE) 0.1 0.0 - 0.4 x10E3/uL   Basophils Absolute 0.0  0.0 - 0.2 x10E3/uL   Immature Granulocytes 0 Not Estab. %   Immature Grans (Abs) 0.0 0.0 - 0.1 x10E3/uL  Comprehensive metabolic panel  Result Value Ref Range   Glucose 103 (H) 70 - 99 mg/dL   BUN 17 8 - 27 mg/dL   Creatinine, Ser 1.19 (H) 0.57 - 1.00 mg/dL  eGFR 49 (L) >59 mL/min/1.73   BUN/Creatinine Ratio 14 12 - 28   Sodium 142 134 - 144 mmol/L   Potassium 4.5 3.5 - 5.2 mmol/L   Chloride 102 96 - 106 mmol/L   CO2 25 20 - 29 mmol/L   Calcium 9.9 8.7 - 10.3 mg/dL   Total Protein 7.1 6.0 - 8.5 g/dL   Albumin 4.6 3.7 - 4.7 g/dL   Globulin, Total 2.5 1.5 - 4.5 g/dL   Albumin/Globulin Ratio 1.8 1.2 - 2.2   Bilirubin Total 1.0 0.0 - 1.2 mg/dL   Alkaline Phosphatase 124 (H) 44 - 121 IU/L   AST 24 0 - 40 IU/L   ALT 22 0 - 32 IU/L  Lipid Panel w/o Chol/HDL Ratio  Result Value Ref Range   Cholesterol, Total 149 100 - 199 mg/dL   Triglycerides 98 0 - 149 mg/dL   HDL 61 >39 mg/dL   VLDL Cholesterol Cal 18 5 - 40 mg/dL   LDL Chol Calc (NIH) 70 0 - 99 mg/dL  Urinalysis, Routine w reflex microscopic  Result Value Ref Range   Specific Gravity, UA 1.015 1.005 - 1.030   pH, UA 5.5 5.0 - 7.5   Color, UA Yellow Yellow   Appearance Ur Clear Clear   Leukocytes,UA Negative Negative   Protein,UA Negative Negative/Trace   Glucose, UA Negative Negative   Ketones, UA Negative Negative   RBC, UA Negative Negative   Bilirubin, UA Negative Negative   Urobilinogen, Ur 0.2 0.2 - 1.0 mg/dL   Nitrite, UA Negative Negative  TSH  Result Value Ref Range   TSH 1.670 0.450 - 4.500 uIU/mL  Microalbumin, Urine Waived  Result Value Ref Range   Microalb, Ur Waived 10 0 - 19 mg/L   Creatinine, Urine Waived 100 10 - 300 mg/dL   Microalb/Creat Ratio <30 <30 mg/g      Assessment & Plan:   Problem List Items Addressed This Visit       Cardiovascular and Mediastinum   Hypertension    Under good control on current regimen. Continue current regimen. Continue to monitor. Call with any concerns. Refills  given. Labs drawn today.       Relevant Medications   atorvastatin (LIPITOR) 20 MG tablet   hydrochlorothiazide (HYDRODIURIL) 50 MG tablet   Other Relevant Orders   Comprehensive metabolic panel     Other   Hypercholesteremia    Under good control on current regimen. Continue current regimen. Continue to monitor. Call with any concerns. Refills given. Labs drawn today.       Relevant Medications   atorvastatin (LIPITOR) 20 MG tablet   hydrochlorothiazide (HYDRODIURIL) 50 MG tablet   Other Relevant Orders   Comprehensive metabolic panel   Lipid Panel w/o Chol/HDL Ratio   Other Visit Diagnoses     Encounter for Medicare annual wellness exam    -  Primary   Preventative care discussed today as below.   Hives       Will have her try zyrtec, if not resolved, will have her stop atorvastatin. Continue clobetasol, call with any concerns.   Encounter for screening mammogram for malignant neoplasm of breast       Mammo ordered today.   Relevant Orders   MM 3D SCREEN BREAST BILATERAL        Preventative Services:  Health Risk Assessment and Personalized Prevention Plan: Done today Bone Mass Measurements: up to date Breast Cancer Screening: ordered today- due in February CVD Screening: Done today Cervical Cancer  Screening: N/A Colon Cancer Screening: up to date Depression Screening: done today Diabetes Screening: done today Glaucoma Screening: see your eye doctor Hepatitis B vaccine: N/A Hepatitis C screening: up to date HIV Screening: up to date Flu Vaccine: up to date Lung cancer Screening: N/A Obesity Screening: done today Pneumonia Vaccines (2): up to date STI Screening: N/A  Follow up plan: Return in about 6 months (around 12/15/2022) for physical.   LABORATORY TESTING:  - Pap smear: not applicable  IMMUNIZATIONS:   - Tdap: Tetanus vaccination status reviewed: last tetanus booster within 10 years. - Influenza: Up to date - Pneumovax: Up to date - Prevnar: Up  to date - Zostavax vaccine: Up to date  SCREENING: -Mammogram: Ordered today  - Colonoscopy: Up to date  - Bone Density: Up to date   PATIENT COUNSELING:   Advised to take 1 mg of folate supplement per day if capable of pregnancy.   Sexuality: Discussed sexually transmitted diseases, partner selection, use of condoms, avoidance of unintended pregnancy  and contraceptive alternatives.   Advised to avoid cigarette smoking.  I discussed with the patient that most people either abstain from alcohol or drink within safe limits (<=14/week and <=4 drinks/occasion for males, <=7/weeks and <= 3 drinks/occasion for females) and that the risk for alcohol disorders and other health effects rises proportionally with the number of drinks per week and how often a drinker exceeds daily limits.  Discussed cessation/primary prevention of drug use and availability of treatment for abuse.   Diet: Encouraged to adjust caloric intake to maintain  or achieve ideal body weight, to reduce intake of dietary saturated fat and total fat, to limit sodium intake by avoiding high sodium foods and not adding table salt, and to maintain adequate dietary potassium and calcium preferably from fresh fruits, vegetables, and low-fat dairy products.    stressed the importance of regular exercise  Injury prevention: Discussed safety belts, safety helmets, smoke detector, smoking near bedding or upholstery.   Dental health: Discussed importance of regular tooth brushing, flossing, and dental visits.    NEXT PREVENTATIVE PHYSICAL DUE IN 1 YEAR. Return in about 6 months (around 12/15/2022) for physical.

## 2022-06-15 NOTE — Assessment & Plan Note (Signed)
Under good control on current regimen. Continue current regimen. Continue to monitor. Call with any concerns. Refills given. Labs drawn today.   

## 2022-06-15 NOTE — Patient Instructions (Addendum)
Please call to schedule your mammogram and/or bone density: [facility] at [place]: [address], [place], [place] [address] Phone: [phone]  [place] [facility] [address] [place],  [place]  [address] Phone: [phone]   Preventative Services:  Health Risk Assessment and Personalized Prevention Plan: Done today Bone Mass Measurements: up to date Breast Cancer Screening: ordered today- due in February CVD Screening: Done today Cervical Cancer Screening: N/A Colon Cancer Screening: up to date Depression Screening: done today Diabetes Screening: done today Glaucoma Screening: see your eye doctor Hepatitis B vaccine: N/A Hepatitis C screening: up to date HIV Screening: up to date Flu Vaccine: up to date Lung cancer Screening: N/A Obesity Screening: done today Pneumonia Vaccines (2): up to date STI Screening: N/A

## 2022-06-16 LAB — COMPREHENSIVE METABOLIC PANEL
ALT: 17 IU/L (ref 0–32)
AST: 22 IU/L (ref 0–40)
Albumin/Globulin Ratio: 2 (ref 1.2–2.2)
Albumin: 4.5 g/dL (ref 3.8–4.8)
Alkaline Phosphatase: 121 IU/L (ref 44–121)
BUN/Creatinine Ratio: 15 (ref 12–28)
BUN: 14 mg/dL (ref 8–27)
Bilirubin Total: 1 mg/dL (ref 0.0–1.2)
CO2: 26 mmol/L (ref 20–29)
Calcium: 9.9 mg/dL (ref 8.7–10.3)
Chloride: 100 mmol/L (ref 96–106)
Creatinine, Ser: 0.91 mg/dL (ref 0.57–1.00)
Globulin, Total: 2.2 g/dL (ref 1.5–4.5)
Glucose: 92 mg/dL (ref 70–99)
Potassium: 4.2 mmol/L (ref 3.5–5.2)
Sodium: 141 mmol/L (ref 134–144)
Total Protein: 6.7 g/dL (ref 6.0–8.5)
eGFR: 67 mL/min/{1.73_m2} (ref >59)

## 2022-06-16 LAB — LIPID PANEL W/O CHOL/HDL RATIO
Cholesterol, Total: 139 mg/dL (ref 100–199)
HDL: 61 mg/dL (ref >39)
LDL Chol Calc (NIH): 62 mg/dL (ref 0–99)
Triglycerides: 84 mg/dL (ref 0–149)
VLDL Cholesterol Cal: 16 mg/dL (ref 5–40)

## 2022-06-25 ENCOUNTER — Ambulatory Visit: Payer: Self-pay

## 2022-06-25 NOTE — Telephone Encounter (Signed)
  Chief Complaint: rash Symptoms: red splotches all over back worse at upper back, itching  Frequency: since Sept just getting worse Pertinent Negatives: NA Disposition: '[]'$ ED /'[]'$ Urgent Care (no appt availability in office) / '[x]'$ Appointment(In office/virtual)/ '[]'$  [place] [facility]/ '[]'$ Home Care/ '[]'$ Refused Recommended Disposition /'[]'$ [facility]/ '[]'$  Follow-up with PCP Additional Notes: pt states rash started when pharmacy changed manufacturer of Atorvastatin, had it switched back to original manufacturer but rash has continued. Pt has taken Zrytec and doesn't do anything and has stopped the atorvastatin since OV on 06/15/22. Pt states she has been using cream from Dermatology that slight helps and took a benadryl last night d/t itching and redness so bad and was effective. Advised pt to continue using cream and taking Benadryl as needed and keep appt on 06/29/22. Did advise if gets worse before appt can go to UC and be seen. Pt verbalized understanding.   Summary: Rash   Patient states that she was seen on 12/19 for a rash on her back and was given Zyrtec to take. Patient states that the rash is getting worse. Scheduled patient for next available appointment on 06/29/22.         Reason for Disposition  Localized rash present > 7 days  Answer Assessment - Initial Assessment Questions 1. APPEARANCE of RASH: "Describe the rash."      Splotches  2. LOCATION: "Where is the rash located?"      On upper back  5. ONSET: "When did the rash start?"      Sept 6. ITCHING: "Does the rash itch?" If Yes, ask: "How bad is the itch?"  (Scale 0-10; or none, mild, moderate, severe)     Yes last night was rough  7. PAIN: "Does the rash hurt?" If Yes, ask: "How bad is the pain?"  (Scale 0-10; or none, mild, moderate, severe)    - NONE (0): no pain    - MILD (1-3): doesn't interfere with normal activities     - MODERATE (4-7): interferes with normal activities or awakens from sleep     - SEVERE  (8-10): excruciating pain, unable to do any normal activities     no 8. OTHER SYMPTOMS: "Do you have any other symptoms?" (e.g., fever)  Protocols used: Rash or Redness - Localized-A-AH

## 2022-06-29 ENCOUNTER — Ambulatory Visit (INDEPENDENT_AMBULATORY_CARE_PROVIDER_SITE_OTHER): Payer: PPO | Admitting: Nurse Practitioner

## 2022-06-29 ENCOUNTER — Encounter: Payer: Self-pay | Admitting: Nurse Practitioner

## 2022-06-29 VITALS — BP 122/74 | HR 97 | Temp 98.1°F | Wt 240.4 lb

## 2022-06-29 DIAGNOSIS — R21 Rash and other nonspecific skin eruption: Secondary | ICD-10-CM

## 2022-06-29 MED ORDER — CLOBETASOL PROPIONATE 0.05 % EX OINT: 1.0000 | TOPICAL_OINTMENT | Freq: Two times a day (BID) | CUTANEOUS | 0 refills | Status: DC

## 2022-06-29 NOTE — Progress Notes (Signed)
BP 122/74   Pulse 97   Temp 98.1 F (36.7 C) (Oral)   Wt 240 lb 6.4 oz (109 kg)   LMP  (LMP Unknown)   SpO2 98%   BMI 43.27 kg/m    Subjective:    Patient ID: Heather James, female    DOB: 26-Sep-1950, 72 y.o.   MRN: [id number]  HPI: Heather James is a 72 y.o. female  Chief Complaint  Patient presents with   Rash    Pt states she has had this rash since September. States she originally thought it was a Doctor, hospital change of her Atorvastatin medication but she was switched back to the previous and still had the rash. States she went to dermatology and was given Clobetasol. States this helps some on the surface but does not clear the rash. States she then came to see Dr. Wynetta Emery who told her that she had hives and was given allergy medication. Patient states she is still having the rash on her back and R buttock.    RASH Duration:  months  Location: trunk and arms  Itching: yes Burning: yes Redness: yes Oozing: no Scaling: no Blisters: no Painful: yes Fevers: no Change in detergents/soaps/personal care products: no Recent illness: no Recent travel:no History of same: no Context: stable Alleviating factors:  clobetasol cream Treatments attempted: clobetasol cream Shortness of breath: no  Throat/tongue swelling: no Myalgias/arthralgias: no Benadryl is helping.  Zyrtec did not help at all.    Relevant past medical, surgical, family and social history reviewed and updated as indicated. Interim medical history since our last visit reviewed. Allergies and medications reviewed and updated.  Review of Systems  Skin:  Positive for rash.    Per HPI unless specifically indicated above     Objective:    BP 122/74   Pulse 97   Temp 98.1 F (36.7 C) (Oral)   Wt 240 lb 6.4 oz (109 kg)   LMP  (LMP Unknown)   SpO2 98%   BMI 43.27 kg/m   Wt Readings from Last 3 Encounters:  06/29/22 240 lb 6.4 oz (109 kg)  06/15/22 238 lb 8 oz (108.2 kg)  12/14/21 241 lb 3.2  oz (109.4 kg)    Physical Exam Vitals and nursing note reviewed.  Constitutional:      General: She is not in acute distress.    Appearance: Normal appearance. She is normal weight. She is not ill-appearing, toxic-appearing or diaphoretic.  HENT:     Head: Normocephalic.     Right Ear: External ear normal.     Left Ear: External ear normal.     Nose: Nose normal.     Mouth/Throat:     Mouth: Mucous membranes are moist.     Pharynx: Oropharynx is clear.  Eyes:     General:        Right eye: No discharge.        Left eye: No discharge.     Extraocular Movements: Extraocular movements intact.     Conjunctiva/sclera: Conjunctivae normal.     Pupils: Pupils are equal, round, and reactive to light.  Cardiovascular:     Rate and Rhythm: Normal rate and regular rhythm.     Heart sounds: No murmur heard. Pulmonary:     Effort: Pulmonary effort is normal. No respiratory distress.     Breath sounds: Normal breath sounds. No wheezing or rales.  Musculoskeletal:     Cervical back: Normal range of motion and neck supple.  Skin:  General: Skin is warm and dry.     Capillary Refill: Capillary refill takes less than 2 seconds.     Findings: Rash present. Rash is macular.  Neurological:     General: No focal deficit present.     Mental Status: She is alert and oriented to person, place, and time. Mental status is at baseline.  Psychiatric:        Mood and Affect: Mood normal.        Behavior: Behavior normal.        Thought Content: Thought content normal.        Judgment: Judgment normal.     Results for orders placed or performed in visit on 06/15/22  Comprehensive metabolic panel  Result Value Ref Range   Glucose 92 70 - 99 mg/dL   BUN 14 8 - 27 mg/dL   Creatinine, Ser 0.91 0.57 - 1.00 mg/dL   eGFR 67 >59 mL/min/1.73   BUN/Creatinine Ratio 15 12 - 28   Sodium 141 134 - 144 mmol/L   Potassium 4.2 3.5 - 5.2 mmol/L   Chloride 100 96 - 106 mmol/L   CO2 26 20 - 29 mmol/L    Calcium 9.9 8.7 - 10.3 mg/dL   Total Protein 6.7 6.0 - 8.5 g/dL   Albumin 4.5 3.8 - 4.8 g/dL   Globulin, Total 2.2 1.5 - 4.5 g/dL   Albumin/Globulin Ratio 2.0 1.2 - 2.2   Bilirubin Total 1.0 0.0 - 1.2 mg/dL   Alkaline Phosphatase 121 44 - 121 IU/L   AST 22 0 - 40 IU/L   ALT 17 0 - 32 IU/L  Lipid Panel w/o Chol/HDL Ratio  Result Value Ref Range   Cholesterol, Total 139 100 - 199 mg/dL   Triglycerides 84 0 - 149 mg/dL   HDL 61 >39 mg/dL   VLDL Cholesterol Cal 16 5 - 40 mg/dL   LDL Chol Calc (NIH) 62 0 - 99 mg/dL      Assessment & Plan:   Problem List Items Addressed This Visit   None Visit Diagnoses     Rash and nonspecific skin eruption    -  Primary   Will refill clobetasol cream. Recommend following up with dermatology for further evaluation and management.        Follow up plan: No follow-ups on file.

## 2022-07-14 ENCOUNTER — Ambulatory Visit (INDEPENDENT_AMBULATORY_CARE_PROVIDER_SITE_OTHER): Payer: PPO

## 2022-07-14 ENCOUNTER — Encounter: Payer: Self-pay | Admitting: Podiatry

## 2022-07-14 ENCOUNTER — Ambulatory Visit (INDEPENDENT_AMBULATORY_CARE_PROVIDER_SITE_OTHER): Payer: PPO | Admitting: Podiatry

## 2022-07-14 DIAGNOSIS — M722 Plantar fascial fibromatosis: Secondary | ICD-10-CM

## 2022-07-14 MED ORDER — TRIAMCINOLONE ACETONIDE 10 MG/ML IJ SUSP
10.0000 mg | Freq: Once | INTRAMUSCULAR | Status: AC
  Administered 2022-07-14: 10 mg

## 2022-07-14 NOTE — Patient Instructions (Signed)

## 2022-07-15 NOTE — Progress Notes (Signed)
Subjective:   Patient ID: Heather James, female   DOB: 72 y.o.   MRN: [id number]   HPI Patient presents with significant discomfort in the right heel stating it has been going on several weeks worse over the last week.  States it is hard to step down on it and she does not remember injury.  Patient does not smoke tries to be active   Review of Systems  All other systems reviewed and are negative.       Objective:  Physical Exam Vitals and nursing note reviewed.  Constitutional:      Appearance: She is well-developed.  Pulmonary:     Effort: Pulmonary effort is normal.  Musculoskeletal:        General: Normal range of motion.  Skin:    General: Skin is warm.  Neurological:     Mental Status: She is alert.     Neurovascular status found to be intact muscle strength found to be adequate range of motion adequate with patient found to have exquisite discomfort in the plantar heel right at the insertional point tendon calcaneus with inflammation fluid around the medial band at the insertional point to the calcaneus.  Moderate flatfoot deformity noted good digital perfusion well-oriented     Assessment:  Acute plantar fasciitis right inflammation fluid around the medial band     Plan:  H&P reviewed condition and went ahead today and did sterile prep of the area injected the plantar fascia at insertion 3 mg Kenalog 5 mg Xylocaine and then applied fascial brace to lift up the arch which was fitted properly into her arch to take tension and pressure off the insertion of the fascia to the calcaneus.  Instructed on physical therapy shoe gear modifications  X-rays indicate spur formation no indication stress fracture arthritis

## 2022-07-19 ENCOUNTER — Ambulatory Visit: Payer: PPO | Admitting: Podiatry

## 2022-07-26 ENCOUNTER — Encounter: Payer: Self-pay | Admitting: Family Medicine

## 2022-07-26 ENCOUNTER — Ambulatory Visit: Payer: PPO | Admitting: Family Medicine

## 2022-07-26 ENCOUNTER — Ambulatory Visit (INDEPENDENT_AMBULATORY_CARE_PROVIDER_SITE_OTHER): Payer: PPO | Admitting: Family Medicine

## 2022-07-26 VITALS — BP 137/84 | HR 92 | Wt 239.3 lb

## 2022-07-26 DIAGNOSIS — L509 Urticaria, unspecified: Secondary | ICD-10-CM | POA: Insufficient documentation

## 2022-07-26 MED ORDER — TRIAMCINOLONE ACETONIDE 40 MG/ML IJ SUSP
40.0000 mg | Freq: Once | INTRAMUSCULAR | Status: AC
  Administered 2022-07-26: 40 mg via INTRAMUSCULAR

## 2022-07-26 MED ORDER — CLOBETASOL PROPIONATE 0.05 % EX OINT: 1.0000 | TOPICAL_OINTMENT | Freq: Two times a day (BID) | CUTANEOUS | 3 refills | Status: DC

## 2022-07-26 MED ORDER — HYDROXYZINE PAMOATE 25 MG PO CAPS: 25.0000 mg | ORAL_CAPSULE | Freq: Three times a day (TID) | ORAL | 2 refills | Status: DC | PRN

## 2022-07-26 MED ORDER — MONTELUKAST SODIUM 10 MG PO TABS: 10.0000 mg | ORAL_TABLET | Freq: Every day | ORAL | 3 refills | Status: DC

## 2022-07-26 MED ORDER — PREDNISONE 10 MG PO TABS: ORAL_TABLET | ORAL | 0 refills | Status: DC

## 2022-07-26 NOTE — Assessment & Plan Note (Signed)
Not better. Zyrtec not working. Benadryl is sedating. Will add singulair and change benadryl to hydroxyzine. Will treat with prednisone taper and get her into allergy. Call with any concerns.

## 2022-07-26 NOTE — Progress Notes (Signed)
BP 137/84   Pulse 92   Wt 239 lb 4.8 oz (108.5 kg)   LMP  (LMP Unknown)   SpO2 98%   BMI 43.07 kg/m    Subjective:    Patient ID: Heather James, female    DOB: 25-Mar-1951, 72 y.o.   MRN: [id number]  HPI: Heather James is a 72 y.o. female  Chief Complaint  Patient presents with   Urticaria   RASH Duration:  about a month  Location: generalized  Itching: yes Burning: yes Redness: yes Oozing: no Scaling: yes Blisters: no Painful: no Fevers: no Change in detergents/soaps/personal care products: no Recent illness: no Recent travel:no History of same: no Context: stable Alleviating factors: hydrocortisone cream and benadryl Treatments attempted:hydrocortisone cream, benadryl, and lotion/moisturizer Shortness of breath: no  Throat/tongue swelling: no Myalgias/arthralgias: no  Relevant past medical, surgical, family and social history reviewed and updated as indicated. Interim medical history since our last visit reviewed. Allergies and medications reviewed and updated.  Review of Systems  Constitutional: Negative.   Respiratory: Negative.    Cardiovascular: Negative.   Musculoskeletal: Negative.   Skin:  Positive for rash. Negative for color change, pallor and wound.  Psychiatric/Behavioral: Negative.      Per HPI unless specifically indicated above     Objective:    BP 137/84   Pulse 92   Wt 239 lb 4.8 oz (108.5 kg)   LMP  (LMP Unknown)   SpO2 98%   BMI 43.07 kg/m   Wt Readings from Last 3 Encounters:  07/26/22 239 lb 4.8 oz (108.5 kg)  06/29/22 240 lb 6.4 oz (109 kg)  06/15/22 238 lb 8 oz (108.2 kg)    Physical Exam Vitals and nursing note reviewed.  Constitutional:      General: She is not in acute distress.    Appearance: Normal appearance. She is obese. She is not ill-appearing, toxic-appearing or diaphoretic.  HENT:     Head: Normocephalic and atraumatic.     Right Ear: External ear normal.     Left Ear: External ear normal.      Nose: Nose normal.     Mouth/Throat:     Mouth: Mucous membranes are moist.     Pharynx: Oropharynx is clear.  Eyes:     General: No scleral icterus.       Right eye: No discharge.        Left eye: No discharge.     Extraocular Movements: Extraocular movements intact.     Conjunctiva/sclera: Conjunctivae normal.     Pupils: Pupils are equal, round, and reactive to light.  Cardiovascular:     Rate and Rhythm: Normal rate and regular rhythm.     Pulses: Normal pulses.     Heart sounds: Normal heart sounds. No murmur heard.    No friction rub. No gallop.  Pulmonary:     Effort: Pulmonary effort is normal. No respiratory distress.     Breath sounds: Normal breath sounds. No stridor. No wheezing, rhonchi or rales.  Chest:     Chest wall: No tenderness.  Musculoskeletal:        General: Normal range of motion.     Cervical back: Normal range of motion and neck supple.  Skin:    General: Skin is warm and dry.     Capillary Refill: Capillary refill takes less than 2 seconds.     Coloration: Skin is not jaundiced or pale.     Findings: Rash (hives on arms, chest, back)  present. No bruising, erythema or lesion.  Neurological:     General: No focal deficit present.     Mental Status: She is alert and oriented to person, place, and time. Mental status is at baseline.  Psychiatric:        Mood and Affect: Mood normal.        Behavior: Behavior normal.        Thought Content: Thought content normal.        Judgment: Judgment normal.     Results for orders placed or performed in visit on 06/15/22  Comprehensive metabolic panel  Result Value Ref Range   Glucose 92 70 - 99 mg/dL   BUN 14 8 - 27 mg/dL   Creatinine, Ser 0.91 0.57 - 1.00 mg/dL   eGFR 67 >59 mL/min/1.73   BUN/Creatinine Ratio 15 12 - 28   Sodium 141 134 - 144 mmol/L   Potassium 4.2 3.5 - 5.2 mmol/L   Chloride 100 96 - 106 mmol/L   CO2 26 20 - 29 mmol/L   Calcium 9.9 8.7 - 10.3 mg/dL   Total Protein 6.7 6.0 - 8.5 g/dL    Albumin 4.5 3.8 - 4.8 g/dL   Globulin, Total 2.2 1.5 - 4.5 g/dL   Albumin/Globulin Ratio 2.0 1.2 - 2.2   Bilirubin Total 1.0 0.0 - 1.2 mg/dL   Alkaline Phosphatase 121 44 - 121 IU/L   AST 22 0 - 40 IU/L   ALT 17 0 - 32 IU/L  Lipid Panel w/o Chol/HDL Ratio  Result Value Ref Range   Cholesterol, Total 139 100 - 199 mg/dL   Triglycerides 84 0 - 149 mg/dL   HDL 61 >39 mg/dL   VLDL Cholesterol Cal 16 5 - 40 mg/dL   LDL Chol Calc (NIH) 62 0 - 99 mg/dL      Assessment & Plan:   Problem List Items Addressed This Visit       Musculoskeletal and Integument   Hives - Primary    Not better. Zyrtec not working. Benadryl is sedating. Will add singulair and change benadryl to hydroxyzine. Will treat with prednisone taper and get her into allergy. Call with any concerns.       Relevant Medications   triamcinolone acetonide (KENALOG-40) injection 40 mg (Start on 07/26/2022  2:00 PM)   Other Relevant Orders   Ambulatory referral to Allergy     Follow up plan: No follow-ups on file.

## 2022-07-28 ENCOUNTER — Ambulatory Visit
Admission: RE | Admit: 2022-07-28 | Discharge: 2022-07-28 | Disposition: A | Discharge status: Home / Self Care | Payer: PPO | Source: Ambulatory Visit | Attending: Family Medicine | Admitting: Family Medicine

## 2022-07-28 ENCOUNTER — Ambulatory Visit (INDEPENDENT_AMBULATORY_CARE_PROVIDER_SITE_OTHER): Payer: PPO | Admitting: Podiatry

## 2022-07-28 ENCOUNTER — Encounter: Payer: Self-pay | Admitting: Podiatry

## 2022-07-28 DIAGNOSIS — Z1231 Encounter for screening mammogram for malignant neoplasm of breast: Secondary | ICD-10-CM | POA: Diagnosis not present

## 2022-07-28 DIAGNOSIS — M722 Plantar fascial fibromatosis: Secondary | ICD-10-CM | POA: Diagnosis not present

## 2022-07-28 MED ORDER — TRIAMCINOLONE ACETONIDE 10 MG/ML IJ SUSP
10.0000 mg | Freq: Once | INTRAMUSCULAR | Status: AC
  Administered 2022-07-28: 10 mg

## 2022-07-28 NOTE — Progress Notes (Signed)
Subjective:   Patient ID: Heather James, female   DOB: 72 y.o.   MRN: [id number]   HPI Patient presents stating it is feeling better where you gave the injection but I am having another area that still sore in my heel   ROS      Objective:  Physical Exam  Neurovascular status intact with inflammation of the right plantar fascia more proximal than before pain still present     Assessment:  Planter fasciitis present with a area that is involved     Plan:  Sterile prep went ahead today and injected the fascia 3 mg Kenalog 5 mg Xylocaine and a more proximal direction

## 2022-08-08 ENCOUNTER — Encounter: Payer: Self-pay | Admitting: Family Medicine

## 2022-08-11 DIAGNOSIS — L501 Idiopathic urticaria: Secondary | ICD-10-CM | POA: Diagnosis not present

## 2022-08-11 DIAGNOSIS — J300 Vasomotor rhinitis: Secondary | ICD-10-CM | POA: Diagnosis not present

## 2022-10-14 ENCOUNTER — Other Ambulatory Visit: Payer: Self-pay | Admitting: Family Medicine

## 2022-10-15 NOTE — Telephone Encounter (Signed)
Requested medication (s) are due for refill today: yes  Requested medication (s) are on the active medication list: yes  Last refill:  07/26/22  Future visit scheduled: yes  Notes to clinic:  Unable to refill per protocol, cannot delegate.      Requested Prescriptions  Pending Prescriptions Disp Refills   predniSONE (DELTASONE) 10 MG tablet [Pharmacy Med Name: PREDNISONE ** TABLETS] 42 tablet 0    Sig: TAKE 6 TABLETS BY MOUTH IN TH AM FFOR DAYS 1-2 AND THEN DECREASE BY 1 TABLET BY MOUTH EVERY 2 DAYS UNTIL GONE.     Not Delegated - Endocrinology:  Oral Corticosteroids Failed - 10/14/2022  3:06 PM      Failed - This refill cannot be delegated      Failed - Manual Review: Eye exam for IOP if prolonged treatment      Failed - Bone Mineral Density or Dexa Scan completed in the last 2 years      Passed - Glucose (serum) in normal range and within 180 days    Glucose  Date Value Ref Range Status  06/15/2022 92 70 - 99 mg/dL Final         Passed - K in normal range and within 180 days    Potassium  Date Value Ref Range Status  06/15/2022 4.2 3.5 - 5.2 mmol/L Final         Passed - Na in normal range and within 180 days    Sodium  Date Value Ref Range Status  06/15/2022 141 134 - 144 mmol/L Final         Passed - Last BP in normal range    BP Readings from Last 1 Encounters:  07/26/22 137/84         Passed - Valid encounter within last 6 months    Recent Outpatient Visits           2 months ago Hives   [place] [facility] [place], Megan P, DO   3 months ago Rash and nonspecific skin eruption   [place] [facility] Larae Grooms, NP   4 months ago Encounter for [employer] annual wellness exam   [place] [facility], Megan P, DO   10 months ago Routine general medical examination at a health care facility   [facility], [place] P, DO   10 months ago Acute gout involving toe of  right foot, unspecified cause   [place] [facility] Larae Grooms, NP       Future Appointments             In 2 months Laural Benes, Oralia Rud, DO  [facility]

## 2022-11-05 ENCOUNTER — Ambulatory Visit (INDEPENDENT_AMBULATORY_CARE_PROVIDER_SITE_OTHER): Payer: PPO | Admitting: Family Medicine

## 2022-11-05 ENCOUNTER — Encounter: Payer: Self-pay | Admitting: Family Medicine

## 2022-11-05 VITALS — BP 127/76 | HR 76 | Temp 98.1°F | Ht 62.5 in | Wt 241.8 lb

## 2022-11-05 DIAGNOSIS — B029 Zoster without complications: Secondary | ICD-10-CM | POA: Diagnosis not present

## 2022-11-05 DIAGNOSIS — R079 Chest pain, unspecified: Secondary | ICD-10-CM | POA: Diagnosis not present

## 2022-11-05 MED ORDER — CLOBETASOL PROPIONATE 0.05 % EX OINT: 1.0000 | TOPICAL_OINTMENT | Freq: Two times a day (BID) | CUTANEOUS | 3 refills | Status: AC

## 2022-11-05 MED ORDER — VALACYCLOVIR HCL 1 G PO TABS: 1000.0000 mg | ORAL_TABLET | Freq: Two times a day (BID) | ORAL | 0 refills | Status: AC

## 2022-11-05 NOTE — Progress Notes (Signed)
BP 127/76   Pulse 76   Temp 98.1 F (36.7 C) (Oral)   Ht 5' 2.5" (1.588 m)   Wt 241 lb 12.8 oz (109.7 kg)   LMP  (LMP Unknown)   SpO2 98%   BMI 43.52 kg/m    Subjective:    Patient ID: Heather James, female    DOB: 1951/04/15, 72 y.o.   MRN: [id number]  HPI: Heather James is a 72 y.o. female  Chief Complaint  Patient presents with   Breast Pain    Patient says about a week ago, she started noticing some pain under her L breast. Patient says the pain is sharp, burning and then goes dull. Patient says the pain has been constant on some days and then today she is not having any pain.    CHEST PAIN Time since onset: about a week ago, went away and came back middle of the week last week Onset: sudden Quality: sharp and burning Severity: mild to moderate Location: under left breast Radiation: none Episode duration: 20-70min to an hour  Frequency: intermittent Related to exertion: no Activity when pain started: at random  Trauma: no Anxiety/recent stressors: no Status: fluctuating Treatments attempted: biofreeze  Current pain status: pain free Shortness of breath: no Cough: no Nausea: no Diaphoresis: no Heartburn: no Palpitations: no  Relevant past medical, surgical, family and social history reviewed and updated as indicated. Interim medical history since our last visit reviewed. Allergies and medications reviewed and updated.  Review of Systems  Constitutional: Negative.   Respiratory: Negative.    Cardiovascular:  Positive for chest pain. Negative for palpitations and leg swelling.  Gastrointestinal: Negative.   Musculoskeletal: Negative.   Skin:  Positive for rash. Negative for color change, pallor and wound.  Psychiatric/Behavioral: Negative.      Per HPI unless specifically indicated above     Objective:    BP 127/76   Pulse 76   Temp 98.1 F (36.7 C) (Oral)   Ht 5' 2.5" (1.588 m)   Wt 241 lb 12.8 oz (109.7 kg)   LMP  (LMP Unknown)    SpO2 98%   BMI 43.52 kg/m   Wt Readings from Last 3 Encounters:  11/05/22 241 lb 12.8 oz (109.7 kg)  07/26/22 239 lb 4.8 oz (108.5 kg)  06/29/22 240 lb 6.4 oz (109 kg)    Physical Exam Vitals and nursing note reviewed.  Constitutional:      General: She is not in acute distress.    Appearance: Normal appearance. She is not ill-appearing, toxic-appearing or diaphoretic.  HENT:     Head: Normocephalic and atraumatic.     Right Ear: External ear normal.     Left Ear: External ear normal.     Nose: Nose normal.     Mouth/Throat:     Mouth: Mucous membranes are moist.     Pharynx: Oropharynx is clear.  Eyes:     General: No scleral icterus.       Right eye: No discharge.        Left eye: No discharge.     Extraocular Movements: Extraocular movements intact.     Conjunctiva/sclera: Conjunctivae normal.     Pupils: Pupils are equal, round, and reactive to light.  Cardiovascular:     Rate and Rhythm: Normal rate and regular rhythm.     Pulses: Normal pulses.     Heart sounds: Normal heart sounds. No murmur heard.    No friction rub. No gallop.  Pulmonary:  Effort: Pulmonary effort is normal. No respiratory distress.     Breath sounds: Normal breath sounds. No stridor. No wheezing, rhonchi or rales.  Chest:     Chest wall: No tenderness.  Musculoskeletal:        General: Normal range of motion.     Cervical back: Normal range of motion and neck supple.  Skin:    General: Skin is warm and dry.     Capillary Refill: Capillary refill takes less than 2 seconds.     Coloration: Skin is not jaundiced or pale.     Findings: Rash (vesicular rash under L breast) present. No bruising, erythema or lesion.  Neurological:     General: No focal deficit present.     Mental Status: She is alert and oriented to person, place, and time. Mental status is at baseline.  Psychiatric:        Mood and Affect: Mood normal.        Behavior: Behavior normal.        Thought Content: Thought content  normal.        Judgment: Judgment normal.     Results for orders placed or performed in visit on 06/15/22  Comprehensive metabolic panel  Result Value Ref Range   Glucose 92 70 - 99 mg/dL   BUN 14 8 - 27 mg/dL   Creatinine, Ser 5.63 0.57 - 1.00 mg/dL   eGFR 67 >87 FI/EPP/2.95   BUN/Creatinine Ratio 15 12 - 28   Sodium 141 134 - 144 mmol/L   Potassium 4.2 3.5 - 5.2 mmol/L   Chloride 100 96 - 106 mmol/L   CO2 26 20 - 29 mmol/L   Calcium 9.9 8.7 - 10.3 mg/dL   Total Protein 6.7 6.0 - 8.5 g/dL   Albumin 4.5 3.8 - 4.8 g/dL   Globulin, Total 2.2 1.5 - 4.5 g/dL   Albumin/Globulin Ratio 2.0 1.2 - 2.2   Bilirubin Total 1.0 0.0 - 1.2 mg/dL   Alkaline Phosphatase 121 44 - 121 IU/L   AST 22 0 - 40 IU/L   ALT 17 0 - 32 IU/L  Lipid Panel w/o Chol/HDL Ratio  Result Value Ref Range   Cholesterol, Total 139 100 - 199 mg/dL   Triglycerides 84 0 - 149 mg/dL   HDL 61 >18 mg/dL   VLDL Cholesterol Cal 16 5 - 40 mg/dL   LDL Chol Calc (NIH) 62 0 - 99 mg/dL      Assessment & Plan:   Problem List Items Addressed This Visit   None Visit Diagnoses     Herpes zoster without complication    -  Primary   Nearly resolved. Will treat with valtrex. Call with any concerns or if not getting better.   Relevant Medications   valACYclovir (VALTREX) 1000 MG tablet   Chest pain, unspecified type       EKG normal. Shingles rash present.   Relevant Orders   EKG 12-Lead (Completed)        Follow up plan: Return if symptoms worsen or fail to improve.

## 2022-11-15 NOTE — Progress Notes (Signed)
Interpreted by me 11/05/22. Poor baseline. NSR 71 bpm. No ST segment changes.

## 2022-11-18 ENCOUNTER — Encounter: Payer: Self-pay | Admitting: Podiatry

## 2022-11-18 ENCOUNTER — Ambulatory Visit: Payer: PPO | Admitting: Podiatry

## 2022-11-18 DIAGNOSIS — M722 Plantar fascial fibromatosis: Secondary | ICD-10-CM | POA: Diagnosis not present

## 2022-11-18 MED ORDER — TRIAMCINOLONE ACETONIDE 10 MG/ML IJ SUSP
10.0000 mg | Freq: Once | INTRAMUSCULAR | Status: AC
  Administered 2022-11-18: 10 mg

## 2022-11-18 NOTE — Progress Notes (Signed)
Subjective:   Patient ID: Heather James, female   DOB: 72 y.o.   MRN: [id number]   HPI Patient states I was doing great but I did a lot of walking and my heel flared up earlier this week   ROS      Objective:  Physical Exam  Neurovascular status intact exquisite discomfort medial fascial band right at the insertional point tendon calcaneus     Assessment:  Acute Planter fasciitis right     Plan:  Sterile prep injected the right plantar fascia 3 mg Kenalog 5 mg Xylocaine and advised this patient on supportive therapy brace usage

## 2022-11-19 ENCOUNTER — Ambulatory Visit (INDEPENDENT_AMBULATORY_CARE_PROVIDER_SITE_OTHER): Payer: PPO | Admitting: Family Medicine

## 2022-11-19 ENCOUNTER — Encounter: Payer: Self-pay | Admitting: Family Medicine

## 2022-11-19 VITALS — BP 120/72 | HR 68 | Temp 98.5°F | Ht 62.5 in | Wt 245.4 lb

## 2022-11-19 DIAGNOSIS — L509 Urticaria, unspecified: Secondary | ICD-10-CM

## 2022-11-19 MED ORDER — TRIAMCINOLONE ACETONIDE 40 MG/ML IJ SUSP
40.0000 mg | Freq: Once | INTRAMUSCULAR | Status: AC
  Administered 2022-11-19: 40 mg via INTRAMUSCULAR

## 2022-11-19 MED ORDER — PREDNISONE 10 MG PO TABS: ORAL_TABLET | ORAL | 0 refills | Status: DC

## 2022-11-19 NOTE — Progress Notes (Signed)
BP 120/72   Pulse 68   Temp 98.5 F (36.9 C) (Oral)   Ht 5' 2.5" (1.588 m)   Wt 245 lb 6.4 oz (111.3 kg)   LMP  (LMP Unknown)   SpO2 97%   BMI 44.17 kg/m    Subjective:    Patient ID: Heather James, female    DOB: 05/01/1951, 72 y.o.   MRN: [id number]  HPI: Heather James is a 72 y.o. female  Chief Complaint  Patient presents with   Urticaria    Patient says she has experienced the hives before, but they are back and they are all over her body. Patient says she was prescribed antihistamine at a prior visit, and she has used those medications until she got this appointment. Patient says the redness and burning has went away, but the little bumps underneath the skin have not went away.    RASH Duration:  about a week  Location: generalized  Itching: yes Burning: yes Redness: yes Oozing: no Scaling: yes Blisters: no Painful: no Fevers: no Change in detergents/soaps/personal care products: no Recent illness: no Recent travel:no History of same: yes Context: better, but not gone Alleviating factors: hydrocortisone cream Treatments attempted:hydrocortisone cream and lotion/moisturizer Shortness of breath: no  Throat/tongue swelling: no Myalgias/arthralgias: no   Relevant past medical, surgical, family and social history reviewed and updated as indicated. Interim medical history since our last visit reviewed. Allergies and medications reviewed and updated.  Review of Systems  Constitutional: Negative.   Respiratory: Negative.    Cardiovascular: Negative.   Gastrointestinal: Negative.   Musculoskeletal: Negative.   Skin:  Positive for rash. Negative for color change, pallor and wound.  Psychiatric/Behavioral: Negative.      Per HPI unless specifically indicated above     Objective:    BP 120/72   Pulse 68   Temp 98.5 F (36.9 C) (Oral)   Ht 5' 2.5" (1.588 m)   Wt 245 lb 6.4 oz (111.3 kg)   LMP  (LMP Unknown)   SpO2 97%   BMI 44.17 kg/m   Wt  Readings from Last 3 Encounters:  11/19/22 245 lb 6.4 oz (111.3 kg)  11/05/22 241 lb 12.8 oz (109.7 kg)  07/26/22 239 lb 4.8 oz (108.5 kg)    Physical Exam Vitals and nursing note reviewed.  Constitutional:      General: She is not in acute distress.    Appearance: Normal appearance. She is obese. She is not ill-appearing, toxic-appearing or diaphoretic.  HENT:     Head: Normocephalic and atraumatic.     Right Ear: External ear normal.     Left Ear: External ear normal.     Nose: Nose normal.     Mouth/Throat:     Mouth: Mucous membranes are moist.     Pharynx: Oropharynx is clear.  Eyes:     General: No scleral icterus.       Right eye: No discharge.        Left eye: No discharge.     Extraocular Movements: Extraocular movements intact.     Conjunctiva/sclera: Conjunctivae normal.     Pupils: Pupils are equal, round, and reactive to light.  Cardiovascular:     Rate and Rhythm: Normal rate and regular rhythm.     Pulses: Normal pulses.     Heart sounds: Normal heart sounds. No murmur heard.    No friction rub. No gallop.  Pulmonary:     Effort: Pulmonary effort is normal. No respiratory distress.  Breath sounds: Normal breath sounds. No stridor. No wheezing, rhonchi or rales.  Chest:     Chest wall: No tenderness.  Musculoskeletal:        General: Normal range of motion.     Cervical back: Normal range of motion and neck supple.  Skin:    General: Skin is warm and dry.     Capillary Refill: Capillary refill takes less than 2 seconds.     Coloration: Skin is not jaundiced or pale.     Findings: Rash (small hives visible on arms and back) present. No bruising, erythema or lesion.  Neurological:     General: No focal deficit present.     Mental Status: She is alert and oriented to person, place, and time. Mental status is at baseline.  Psychiatric:        Mood and Affect: Mood normal.        Behavior: Behavior normal.        Thought Content: Thought content normal.         Judgment: Judgment normal.     Results for orders placed or performed in visit on 06/15/22  Comprehensive metabolic panel  Result Value Ref Range   Glucose 92 70 - 99 mg/dL   BUN 14 8 - 27 mg/dL   Creatinine, Ser 1.61 0.57 - 1.00 mg/dL   eGFR 67 >09 UE/AVW/0.98   BUN/Creatinine Ratio 15 12 - 28   Sodium 141 134 - 144 mmol/L   Potassium 4.2 3.5 - 5.2 mmol/L   Chloride 100 96 - 106 mmol/L   CO2 26 20 - 29 mmol/L   Calcium 9.9 8.7 - 10.3 mg/dL   Total Protein 6.7 6.0 - 8.5 g/dL   Albumin 4.5 3.8 - 4.8 g/dL   Globulin, Total 2.2 1.5 - 4.5 g/dL   Albumin/Globulin Ratio 2.0 1.2 - 2.2   Bilirubin Total 1.0 0.0 - 1.2 mg/dL   Alkaline Phosphatase 121 44 - 121 IU/L   AST 22 0 - 40 IU/L   ALT 17 0 - 32 IU/L  Lipid Panel w/o Chol/HDL Ratio  Result Value Ref Range   Cholesterol, Total 139 100 - 199 mg/dL   Triglycerides 84 0 - 149 mg/dL   HDL 61 >11 mg/dL   VLDL Cholesterol Cal 16 5 - 40 mg/dL   LDL Chol Calc (NIH) 62 0 - 99 mg/dL      Assessment & Plan:   Problem List Items Addressed This Visit       Musculoskeletal and Integument   Hives - Primary    Will treat with triamcinalone and prednisone. Call with any concerns or it not improving.       Relevant Medications   triamcinolone acetonide (KENALOG-40) injection 40 mg (Start on 11/19/2022  8:45 AM)     Follow up plan: Return as scheduled.

## 2022-11-19 NOTE — Assessment & Plan Note (Signed)
Will treat with triamcinalone and prednisone. Call with any concerns or it not improving.

## 2022-11-25 ENCOUNTER — Encounter: Payer: Self-pay | Admitting: Podiatry

## 2022-11-25 ENCOUNTER — Ambulatory Visit: Payer: PPO | Admitting: Podiatry

## 2022-11-25 DIAGNOSIS — M722 Plantar fascial fibromatosis: Secondary | ICD-10-CM | POA: Diagnosis not present

## 2022-11-25 MED ORDER — TRIAMCINOLONE ACETONIDE 10 MG/ML IJ SUSP
10.0000 mg | Freq: Once | INTRAMUSCULAR | Status: AC
  Administered 2022-11-25: 10 mg

## 2022-11-25 NOTE — Progress Notes (Signed)
Subjective:   Patient ID: Heather James, female   DOB: 73 y.o.   MRN: [id number]   HPI Patient states she seems to be doing reasonably well but she has a new area of pain that is more proximal and she is getting ready to go on vacation and would like injection   ROS      Objective:  Physical Exam  Neuro vas status intact with inflammation pain in the more proximal fascia at the insertion of the tendon into the calcaneus     Assessment:  Acute fascial symptomatology right that is more proximal now in its nature versus distal     Plan:  1 more injection will be done sterile prep injected the proximal fascia at insertion 3 mg dexamethasone Kenalog 5 mg Xylocaine advised on support reappoint as symptoms indicate

## 2022-11-30 DIAGNOSIS — R079 Chest pain, unspecified: Secondary | ICD-10-CM | POA: Diagnosis not present

## 2022-11-30 DIAGNOSIS — S20212A Contusion of left front wall of thorax, initial encounter: Secondary | ICD-10-CM | POA: Diagnosis not present

## 2022-11-30 DIAGNOSIS — W1830XA Fall on same level, unspecified, initial encounter: Secondary | ICD-10-CM | POA: Diagnosis not present

## 2022-12-01 DIAGNOSIS — R079 Chest pain, unspecified: Secondary | ICD-10-CM | POA: Diagnosis not present

## 2022-12-16 ENCOUNTER — Ambulatory Visit (INDEPENDENT_AMBULATORY_CARE_PROVIDER_SITE_OTHER): Payer: PPO | Admitting: Family Medicine

## 2022-12-16 ENCOUNTER — Ambulatory Visit: Payer: PPO | Admitting: Family Medicine

## 2022-12-16 ENCOUNTER — Encounter: Payer: Self-pay | Admitting: Family Medicine

## 2022-12-16 VITALS — BP 123/72 | HR 66 | Temp 98.4°F | Ht 62.5 in | Wt 238.0 lb

## 2022-12-16 DIAGNOSIS — Z1211 Encounter for screening for malignant neoplasm of colon: Secondary | ICD-10-CM | POA: Diagnosis not present

## 2022-12-16 DIAGNOSIS — E7800 Pure hypercholesterolemia, unspecified: Secondary | ICD-10-CM | POA: Diagnosis not present

## 2022-12-16 DIAGNOSIS — Z23 Encounter for immunization: Secondary | ICD-10-CM

## 2022-12-16 DIAGNOSIS — Z8249 Family history of ischemic heart disease and other diseases of the circulatory system: Secondary | ICD-10-CM

## 2022-12-16 DIAGNOSIS — Z1231 Encounter for screening mammogram for malignant neoplasm of breast: Secondary | ICD-10-CM

## 2022-12-16 DIAGNOSIS — R8281 Pyuria: Secondary | ICD-10-CM

## 2022-12-16 DIAGNOSIS — Z0000 Encounter for general adult medical examination without abnormal findings: Secondary | ICD-10-CM

## 2022-12-16 DIAGNOSIS — I10 Essential (primary) hypertension: Secondary | ICD-10-CM | POA: Diagnosis not present

## 2022-12-16 DIAGNOSIS — Z13820 Encounter for screening for osteoporosis: Secondary | ICD-10-CM

## 2022-12-16 LAB — URINALYSIS, ROUTINE W REFLEX MICROSCOPIC
Bilirubin, UA: NEGATIVE
Glucose, UA: NEGATIVE
Ketones, UA: NEGATIVE
Nitrite, UA: NEGATIVE
Specific Gravity, UA: 1.025 (ref 1.005–1.030)
Urobilinogen, Ur: 1 mg/dL (ref 0.2–1.0)
pH, UA: 6 (ref 5.0–7.5)

## 2022-12-16 LAB — MICROALBUMIN, URINE WAIVED
Creatinine, Urine Waived: 200 mg/dL (ref 10–300)
Microalb, Ur Waived: 30 mg/L — ABNORMAL HIGH (ref 0–19)
Microalb/Creat Ratio: <30 mg/g (ref <30)

## 2022-12-16 LAB — MICROSCOPIC EXAMINATION: Bacteria, UA: NONE SEEN

## 2022-12-16 MED ORDER — HYDROXYZINE PAMOATE 25 MG PO CAPS: 25.0000 mg | ORAL_CAPSULE | Freq: Three times a day (TID) | ORAL | 2 refills | Status: DC | PRN

## 2022-12-16 MED ORDER — ATORVASTATIN CALCIUM 20 MG PO TABS: ORAL_TABLET | ORAL | 1 refills | Status: DC

## 2022-12-16 MED ORDER — MONTELUKAST SODIUM 10 MG PO TABS: 10.0000 mg | ORAL_TABLET | Freq: Every day | ORAL | 3 refills | Status: DC

## 2022-12-16 MED ORDER — MELOXICAM 7.5 MG PO TABS: 7.5000 mg | ORAL_TABLET | Freq: Every day | ORAL | 1 refills | Status: DC | PRN

## 2022-12-16 MED ORDER — HYDROCHLOROTHIAZIDE 50 MG PO TABS: 50.0000 mg | ORAL_TABLET | ORAL | 1 refills | Status: DC

## 2022-12-16 MED ORDER — ALBUTEROL SULFATE HFA 108 (90 BASE) MCG/ACT IN AERS: 2.0000 | INHALATION_SPRAY | Freq: Four times a day (QID) | RESPIRATORY_TRACT | 6 refills | Status: DC | PRN

## 2022-12-16 NOTE — Assessment & Plan Note (Signed)
Under good control on current regimen. Continue current regimen. Continue to monitor. Call with any concerns. Refills given. Labs drawn today.   

## 2022-12-16 NOTE — Patient Instructions (Addendum)
[facility]  [phone]

## 2022-12-16 NOTE — Progress Notes (Signed)
BP 123/72   Pulse 66   Temp 98.4 F (36.9 C) (Oral)   Ht 5' 2.5" (1.588 m)   Wt 238 lb (108 kg)   LMP  (LMP Unknown)   SpO2 98%   BMI 42.84 kg/m    Subjective:    Patient ID: Heather James, female    DOB: 03/29/1951, 72 y.o.   MRN: [id number]  HPI: Heather James is a 72 y.o. female presenting on 12/16/2022 for comprehensive medical examination. Current medical complaints include:  HYPERTENSION / HYPERLIPIDEMIA Satisfied with current treatment? yes Duration of hypertension: chronic BP monitoring frequency: not checking BP medication side effects: no Past BP meds: HCTZ Duration of hyperlipidemia: chronic Cholesterol medication side effects: no Cholesterol supplements: none Past cholesterol medications: atorvastatin Medication compliance: excellent compliance Aspirin: yes Recent stressors: no Recurrent headaches: no Visual changes: no Palpitations: no Dyspnea: no Chest pain: no Lower extremity edema: no Dizzy/lightheaded: no   Menopausal Symptoms: yes- doing well with estroven  Depression Screen done today and results listed below:     12/16/2022    8:07 AM 11/19/2022    8:15 AM 11/05/2022   11:00 AM 07/26/2022    1:37 PM 06/15/2022    8:41 AM  Depression screen PHQ 2/9  Decreased Interest 0 0 0 0 0  Down, Depressed, Hopeless 0 0 0 0 0  PHQ - 2 Score 0 0 0 0 0  Altered sleeping 0 0 0 0 0  Tired, decreased energy 0 0 0 0 0  Change in appetite 0 0 0 0 0  Feeling bad or failure about yourself  0 0 0 0 0  Trouble concentrating 0 0 0 0 0  Moving slowly or fidgety/restless 0 0 0 0 0  Suicidal thoughts 0 0 0 0 0  PHQ-9 Score 0 0 0 0 0  Difficult doing work/chores Not difficult at all Not difficult at all Not difficult at all Not difficult at all Not difficult at all     Past Medical History:  Past Medical History:  Diagnosis Date   Advanced care planning/counseling discussion 05/30/2017   Cancer (HCC)    skin ca on leg   CSF leak from ear     Hypercholesteremia    Hypertension    Obesity    Osteopenia     Surgical History:  Past Surgical History:  Procedure Laterality Date   BREAST BIOPSY Left 11/21/1996   neg   BREAST BIOPSY Right 08/18/2021   affirm bx 2 area , x marker, path pending   BREAST SURGERY Left    cyst removed   COLONOSCOPY WITH PROPOFOL N/A 03/14/2018   Procedure: COLONOSCOPY WITH PROPOFOL;  Surgeon: Toledo, Boykin Nearing, MD;  Location: [facility] ENDOSCOPY;  Service: Gastroenterology;  Laterality: N/A;   FACIAL NERVE SURGERY     PILONIDAL CYST EXCISION     SKIN CANCER EXCISION Right    right leg, April 2017    Medications:  Current Outpatient Medications on File Prior to Visit  Medication Sig   aspirin 81 MG tablet Take 81 mg by mouth daily.   calcium carbonate (OS-CAL - DOSED IN MG OF ELEMENTAL CALCIUM) 1250 (500 Ca) MG tablet Take 1 tablet by mouth.   calcium-vitamin D (OSCAL WITH D) 500-200 MG-UNIT tablet Take 1 tablet by mouth.   clobetasol ointment (TEMOVATE) 0.05 % Apply 1 Application topically 2 (two) times daily.   diclofenac sodium (VOLTAREN) 1 % GEL Apply 4 g topically 4 (four) times daily.   mometasone (  NASONEX) 50 MCG/ACT nasal spray Place 2 sprays into the nose daily.   potassium chloride SA (K-DUR,KLOR-CON) 20 MEQ tablet TK 1 T PO QD WF   raloxifene (EVISTA) 60 MG tablet Take 1 tablet (60 mg total) by mouth daily.   No current facility-administered medications on file prior to visit.    Allergies:  No Known Allergies  Social History:  Social History   Socioeconomic History   Marital status: Divorced    Spouse name: Not on file   Number of children: Not on file   Years of education: 12   Highest education level: 12th grade  Occupational History   Occupation: retired  Tobacco Use   Smoking status: Never   Smokeless tobacco: Never  Building services engineer Use: Never used  Substance and Sexual Activity   Alcohol use: No    Alcohol/week: 0.0 standard drinks of alcohol   Drug use: No    Sexual activity: Not Currently    Birth control/protection: None  Other Topics Concern   Not on file  Social History Narrative   Not on file   Social Determinants of Health   Financial Resource Strain: Low Risk  (06/11/2021)   Overall Financial Resource Strain (CARDIA)    Difficulty of Paying Living Expenses: Not hard at all  Food Insecurity: No Food Insecurity (11/03/2022)   Hunger Vital Sign    Worried About Running Out of Food in the Last Year: Never true    Ran Out of Food in the Last Year: Never true  Transportation Needs: No Transportation Needs (11/03/2022)   PRAPARE - Administrator, Civil Service (Medical): No    Lack of Transportation (Non-Medical): No  Physical Activity: Insufficiently Active (11/03/2022)   Exercise Vital Sign    Days of Exercise per Week: 1 day    Minutes of Exercise per Session: 20 min  Stress: No Stress Concern Present (11/03/2022)   [employer] of Occupational Health - Occupational Stress Questionnaire    Feeling of Stress : Not at all  Social Connections: Moderately Isolated (11/03/2022)   Social Connection and Isolation Panel [NHANES]    Frequency of Communication with Friends and Family: Twice a week    Frequency of Social Gatherings with Friends and Family: Twice a week    Attends Religious Services: More than 4 times per year    Active Member of [employer] or Organizations: No    Attends Engineer, structural: Not on file    Marital Status: Divorced  Intimate Partner Violence: Not At Risk (06/11/2021)   Humiliation, Afraid, Rape, and Kick questionnaire    Fear of Current or Ex-Partner: No    Emotionally Abused: No    Physically Abused: No    Sexually Abused: No   Social History   Tobacco Use  Smoking Status Never  Smokeless Tobacco Never   Social History   Substance and Sexual Activity  Alcohol Use No   Alcohol/week: 0.0 standard drinks of alcohol    Family History:  Family History  Problem Relation Age of  Onset   Heart disease Mother    Diabetes Mother    Hypertension Mother    Breast cancer Mother 12   Cancer Mother    Heart disease Father    Diabetes Father    Hypertension Brother    Cancer Maternal Grandmother        spine   Heart disease Maternal Grandfather    Hearing loss Maternal Grandfather    Heart  disease Paternal Grandmother    Emphysema Paternal Grandfather     Past medical history, surgical history, medications, allergies, family history and social history reviewed with patient today and changes made to appropriate areas of the chart.   Review of Systems  Constitutional:  Positive for diaphoresis. Negative for chills, fever, malaise/fatigue and weight loss.  HENT:  Positive for nosebleeds. Negative for congestion, ear discharge, ear pain, hearing loss, sinus pain, sore throat and tinnitus.   Eyes: Negative.   Respiratory:  Positive for wheezing. Negative for cough, hemoptysis, sputum production, shortness of breath and stridor.   Cardiovascular:  Positive for leg swelling (occasionally on a hot day). Negative for chest pain, palpitations, orthopnea, claudication and PND.  Gastrointestinal: Negative.   Genitourinary: Negative.   Musculoskeletal: Negative.   Skin: Negative.   Neurological: Negative.   Endo/Heme/Allergies: Negative.   Psychiatric/Behavioral: Negative.     All other ROS negative except what is listed above and in the HPI.      Objective:    BP 123/72   Pulse 66   Temp 98.4 F (36.9 C) (Oral)   Ht 5' 2.5" (1.588 m)   Wt 238 lb (108 kg)   LMP  (LMP Unknown)   SpO2 98%   BMI 42.84 kg/m   Wt Readings from Last 3 Encounters:  12/16/22 238 lb (108 kg)  11/19/22 245 lb 6.4 oz (111.3 kg)  11/05/22 241 lb 12.8 oz (109.7 kg)    Physical Exam Vitals and nursing note reviewed.  Constitutional:      General: She is not in acute distress.    Appearance: Normal appearance. She is obese. She is not ill-appearing, toxic-appearing or diaphoretic.   HENT:     Head: Normocephalic and atraumatic.     Right Ear: Tympanic membrane, ear canal and external ear normal. There is no impacted cerumen.     Left Ear: Tympanic membrane, ear canal and external ear normal. There is no impacted cerumen.     Nose: Nose normal. No congestion or rhinorrhea.     Mouth/Throat:     Mouth: Mucous membranes are moist.     Pharynx: Oropharynx is clear. No oropharyngeal exudate or posterior oropharyngeal erythema.  Eyes:     General: No scleral icterus.       Right eye: No discharge.        Left eye: No discharge.     Extraocular Movements: Extraocular movements intact.     Conjunctiva/sclera: Conjunctivae normal.     Pupils: Pupils are equal, round, and reactive to light.  Neck:     Vascular: No carotid bruit.  Cardiovascular:     Rate and Rhythm: Normal rate and regular rhythm.     Pulses: Normal pulses.     Heart sounds: No murmur heard.    No friction rub. No gallop.  Pulmonary:     Effort: Pulmonary effort is normal. No respiratory distress.     Breath sounds: Normal breath sounds. No stridor. No wheezing, rhonchi or rales.  Chest:     Chest wall: No tenderness.  Abdominal:     General: Abdomen is flat. Bowel sounds are normal. There is no distension.     Palpations: Abdomen is soft. There is no mass.     Tenderness: There is no abdominal tenderness. There is no right CVA tenderness, left CVA tenderness, guarding or rebound.     Hernia: No hernia is present.  Genitourinary:    Comments: Breast and pelvic exams deferred with shared decision  making Musculoskeletal:        General: No swelling, tenderness, deformity or signs of injury.     Cervical back: Normal range of motion and neck supple. No rigidity. No muscular tenderness.     Right lower leg: No edema.     Left lower leg: No edema.  Lymphadenopathy:     Cervical: No cervical adenopathy.  Skin:    General: Skin is warm and dry.     Capillary Refill: Capillary refill takes less than 2  seconds.     Coloration: Skin is not jaundiced or pale.     Findings: No bruising, erythema, lesion or rash.  Neurological:     General: No focal deficit present.     Mental Status: She is alert and oriented to person, place, and time. Mental status is at baseline.     Cranial Nerves: No cranial nerve deficit.     Sensory: No sensory deficit.     Motor: No weakness.     Coordination: Coordination normal.     Gait: Gait normal.     Deep Tendon Reflexes: Reflexes normal.  Psychiatric:        Mood and Affect: Mood normal.        Behavior: Behavior normal.        Thought Content: Thought content normal.        Judgment: Judgment normal.     Results for orders placed or performed in visit on 06/15/22  Comprehensive metabolic panel  Result Value Ref Range   Glucose 92 70 - 99 mg/dL   BUN 14 8 - 27 mg/dL   Creatinine, Ser 9.62 0.57 - 1.00 mg/dL   eGFR 67 >95 MW/UXL/2.44   BUN/Creatinine Ratio 15 12 - 28   Sodium 141 134 - 144 mmol/L   Potassium 4.2 3.5 - 5.2 mmol/L   Chloride 100 96 - 106 mmol/L   CO2 26 20 - 29 mmol/L   Calcium 9.9 8.7 - 10.3 mg/dL   Total Protein 6.7 6.0 - 8.5 g/dL   Albumin 4.5 3.8 - 4.8 g/dL   Globulin, Total 2.2 1.5 - 4.5 g/dL   Albumin/Globulin Ratio 2.0 1.2 - 2.2   Bilirubin Total 1.0 0.0 - 1.2 mg/dL   Alkaline Phosphatase 121 44 - 121 IU/L   AST 22 0 - 40 IU/L   ALT 17 0 - 32 IU/L  Lipid Panel w/o Chol/HDL Ratio  Result Value Ref Range   Cholesterol, Total 139 100 - 199 mg/dL   Triglycerides 84 0 - 149 mg/dL   HDL 61 >01 mg/dL   VLDL Cholesterol Cal 16 5 - 40 mg/dL   LDL Chol Calc (NIH) 62 0 - 99 mg/dL      Assessment & Plan:   Problem List Items Addressed This Visit       Cardiovascular and Mediastinum   Hypertension    Under good control on current regimen. Continue current regimen. Continue to monitor. Call with any concerns. Refills given. Labs drawn today.        Relevant Medications   atorvastatin (LIPITOR) 20 MG tablet    hydrochlorothiazide (HYDRODIURIL) 50 MG tablet   Other Relevant Orders   CBC with Differential/Platelet   Comprehensive metabolic panel   Lipid Panel w/o Chol/HDL Ratio   Urinalysis, Routine w reflex microscopic   TSH   Microalbumin, Urine Waived     Other   Hypercholesteremia    Under good control on current regimen. Continue current regimen. Continue to monitor. Call with  any concerns. Refills given. Labs drawn today.       Relevant Medications   atorvastatin (LIPITOR) 20 MG tablet   hydrochlorothiazide (HYDRODIURIL) 50 MG tablet   Other Relevant Orders   CBC with Differential/Platelet   Comprehensive metabolic panel   Lipid Panel w/o Chol/HDL Ratio   Other Visit Diagnoses     Routine general medical examination at a health care facility    -  Primary   Vaccines up to date. Screening labs checked today. Mammo, colonoscopy and DEXA up to date. Continue diet and exercise. Call with any concerns.   Family history of heart disease       Her brother recently had a heart attack. She would like to see cardiology to discuss further treatment.   Relevant Orders   Ambulatory referral to Cardiology   Ambulatory referral to Gastroenterology   Screening for colon cancer       Referral generated today.   Encounter for screening mammogram for malignant neoplasm of breast       Due in February. Ordered today.   Relevant Orders   MM 3D SCREENING MAMMOGRAM BILATERAL BREAST   Screening for osteoporosis       Due in February. Ordered today.   Relevant Orders   DG Bone Density        Follow up plan: Return in about 6 months (around 06/17/2023).   LABORATORY TESTING:  - Pap smear: not applicable  IMMUNIZATIONS:   - Tdap: Tetanus vaccination status reviewed: last tetanus booster within 10 years. - Influenza: Postponed to flu season - Pneumovax: Up to date - Prevnar: Up to date - COVID: Up to date - HPV: Not applicable - Shingrix vaccine: Up to date  SCREENING: -Mammogram: Up  to date  - Colonoscopy: Ordered today  - Bone Density: Up to date   PATIENT COUNSELING:   Advised to take 1 mg of folate supplement per day if capable of pregnancy.   Sexuality: Discussed sexually transmitted diseases, partner selection, use of condoms, avoidance of unintended pregnancy  and contraceptive alternatives.   Advised to avoid cigarette smoking.  I discussed with the patient that most people either abstain from alcohol or drink within safe limits (<=14/week and <=4 drinks/occasion for males, <=7/weeks and <= 3 drinks/occasion for females) and that the risk for alcohol disorders and other health effects rises proportionally with the number of drinks per week and how often a drinker exceeds daily limits.  Discussed cessation/primary prevention of drug use and availability of treatment for abuse.   Diet: Encouraged to adjust caloric intake to maintain  or achieve ideal body weight, to reduce intake of dietary saturated fat and total fat, to limit sodium intake by avoiding high sodium foods and not adding table salt, and to maintain adequate dietary potassium and calcium preferably from fresh fruits, vegetables, and low-fat dairy products.    stressed the importance of regular exercise  Injury prevention: Discussed safety belts, safety helmets, smoke detector, smoking near bedding or upholstery.   Dental health: Discussed importance of regular tooth brushing, flossing, and dental visits.    NEXT PREVENTATIVE PHYSICAL DUE IN 1 YEAR. Return in about 6 months (around 06/17/2023).

## 2022-12-17 DIAGNOSIS — R8281 Pyuria: Secondary | ICD-10-CM | POA: Diagnosis not present

## 2022-12-17 LAB — COMPREHENSIVE METABOLIC PANEL
ALT: 22 IU/L (ref 0–32)
AST: 20 IU/L (ref 0–40)
Albumin: 4.4 g/dL (ref 3.8–4.8)
Alkaline Phosphatase: 132 IU/L — ABNORMAL HIGH (ref 44–121)
BUN/Creatinine Ratio: 18 (ref 12–28)
BUN: 17 mg/dL (ref 8–27)
Bilirubin Total: 0.9 mg/dL (ref 0.0–1.2)
CO2: 26 mmol/L (ref 20–29)
Calcium: 9.8 mg/dL (ref 8.7–10.3)
Chloride: 103 mmol/L (ref 96–106)
Creatinine, Ser: 0.93 mg/dL (ref 0.57–1.00)
Globulin, Total: 2.3 g/dL (ref 1.5–4.5)
Glucose: 97 mg/dL (ref 70–99)
Potassium: 4.8 mmol/L (ref 3.5–5.2)
Sodium: 143 mmol/L (ref 134–144)
Total Protein: 6.7 g/dL (ref 6.0–8.5)
eGFR: 65 mL/min/{1.73_m2} (ref >59)

## 2022-12-17 LAB — CBC WITH DIFFERENTIAL/PLATELET
Basophils Absolute: 0.1 10*3/uL (ref 0.0–0.2)
Basos: 1 %
EOS (ABSOLUTE): 0.1 10*3/uL (ref 0.0–0.4)
Eos: 1 %
Hematocrit: 46.7 % — ABNORMAL HIGH (ref 34.0–46.6)
Hemoglobin: 15.1 g/dL (ref 11.1–15.9)
Immature Grans (Abs): 0 10*3/uL (ref 0.0–0.1)
Immature Granulocytes: 0 %
Lymphocytes Absolute: 1.4 10*3/uL (ref 0.7–3.1)
Lymphs: 21 %
MCH: 30.4 pg (ref 26.6–33.0)
MCHC: 32.3 g/dL (ref 31.5–35.7)
MCV: 94 fL (ref 79–97)
Monocytes Absolute: 0.5 10*3/uL (ref 0.1–0.9)
Monocytes: 8 %
Neutrophils Absolute: 4.5 10*3/uL (ref 1.4–7.0)
Neutrophils: 69 %
Platelets: 257 10*3/uL (ref 150–450)
RBC: 4.96 x10E6/uL (ref 3.77–5.28)
RDW: 13.1 % (ref 11.7–15.4)
WBC: 6.6 10*3/uL (ref 3.4–10.8)

## 2022-12-17 LAB — TSH: TSH: 1.96 u[IU]/mL (ref 0.450–4.500)

## 2022-12-17 LAB — LIPID PANEL W/O CHOL/HDL RATIO
Cholesterol, Total: 143 mg/dL (ref 100–199)
HDL: 67 mg/dL (ref >39)
LDL Chol Calc (NIH): 63 mg/dL (ref 0–99)
Triglycerides: 63 mg/dL (ref 0–149)
VLDL Cholesterol Cal: 13 mg/dL (ref 5–40)

## 2022-12-17 NOTE — Addendum Note (Signed)
Addended by: Dorcas Carrow on: 12/17/2022 01:44 PM   Modules accepted: Orders

## 2022-12-19 LAB — URINE CULTURE

## 2022-12-23 ENCOUNTER — Ambulatory Visit: Payer: PPO | Admitting: Podiatry

## 2022-12-27 ENCOUNTER — Telehealth: Payer: Self-pay | Admitting: Family Medicine

## 2022-12-27 NOTE — Telephone Encounter (Signed)
Copied from CRM [id number]. Topic: General - Inquiry >> Dec 27, 2022 11:25 AM Patsy Lager T wrote: Reason for CRM: patient called stated she need an order faxed to [facility] at [phone] before she can schedule her appt. Please f/u with the clinic

## 2022-12-27 NOTE — Telephone Encounter (Signed)
Please follow up on referral

## 2022-12-27 NOTE — Telephone Encounter (Unsigned)
Copied from CRM [id number]. Topic: Referral - Status >> Dec 27, 2022 10:15 AM Heather James wrote: Patient states that she tried calling [facility] to schedule her colonoscopy and they do not have a copy of her referral from 6/20. Please advise.

## 2023-01-20 ENCOUNTER — Other Ambulatory Visit: Payer: Self-pay | Admitting: Family Medicine

## 2023-01-21 NOTE — Telephone Encounter (Signed)
Requested medication (s) are due for refill today: Yes  Requested medication (s) are on the active medication list: Yes  Last refill:  06/15/22  Future visit scheduled: Yes  Notes to clinic:  Protocol indicates imaging needed.    Requested Prescriptions  Pending Prescriptions Disp Refills   raloxifene (EVISTA) 60 MG tablet [Pharmacy Med Name: RALOXIFENE 60MG  TABLETS] 90 tablet 3    Sig: TAKE 1 TABLET(60 MG) BY MOUTH DAILY     OB/GYN: Selective Estrogen Receptor Modulators 2 Failed - 01/20/2023  8:29 PM      Failed - Bone Mineral Density or Dexa Scan completed in the last 2 years      Passed - Ca in normal range and within 360 days    Calcium  Date Value Ref Range Status  12/16/2022 9.8 8.7 - 10.3 mg/dL Final         Passed - Valid encounter within last 12 months    Recent Outpatient Visits           1 month ago Routine general medical examination at a health care facility   [facility] [place], Megan P, DO   2 months ago Hives   [place] [facility] [place], [place] P, DO   2 months ago Herpes zoster without complication   [place] [facility] [place], Megan P, DO   5 months ago Hives   [place] [facility] [place], [place], DO   6 months ago Rash and nonspecific skin eruption   [place] [facility] Larae Grooms, NP       Future Appointments             In 4 weeks Gollan, Tollie Pizza, MD [facility]   In 4 months Dorcas Carrow, DO  [facility]

## 2023-01-24 DIAGNOSIS — D2261 Melanocytic nevi of right upper limb, including shoulder: Secondary | ICD-10-CM | POA: Diagnosis not present

## 2023-01-24 DIAGNOSIS — X32XXXA Exposure to sunlight, initial encounter: Secondary | ICD-10-CM | POA: Diagnosis not present

## 2023-01-24 DIAGNOSIS — D2262 Melanocytic nevi of left upper limb, including shoulder: Secondary | ICD-10-CM | POA: Diagnosis not present

## 2023-01-24 DIAGNOSIS — D2271 Melanocytic nevi of right lower limb, including hip: Secondary | ICD-10-CM | POA: Diagnosis not present

## 2023-01-24 DIAGNOSIS — Z85828 Personal history of other malignant neoplasm of skin: Secondary | ICD-10-CM | POA: Diagnosis not present

## 2023-01-24 DIAGNOSIS — L570 Actinic keratosis: Secondary | ICD-10-CM | POA: Diagnosis not present

## 2023-02-17 NOTE — Progress Notes (Signed)
Cardiology Office Note  Date:  02/18/2023   ID:  Heather, James Mar 11, 1951, MRN [id number]  PCP:  Dorcas Carrow, DO   Chief Complaint  Patient presents with   New Patient (Initial Visit)    Ref by Heather Perches, DO for evaluation of CAD; family history of CAD. Patient c/o shortness of breath with over exertion. Medications reviewed by the patient verbally.     HPI:  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 Olevia James for family history of heart disease  Chest pain in May 2024 , shingles Brother with CAD, CABG in 5/24  In June, seen by PMD,  Seen in the emergency room November 30, 2022 for chest pain  Reports that she gets SOB with over exertion No regular exercise program  Grandparents and parents with "heart disease"  EKG personally reviewed by myself on todays visit EKG Interpretation Date/Time:  Friday February 18 2023 08:53:02 EDT Ventricular Rate:  79 PR Interval:  162 QRS Duration:  70 QT Interval:  352 QTC Calculation: 403 R Axis:   -24  Text Interpretation: Normal sinus rhythm Minimal voltage criteria for LVH, may be normal variant ( R in aVL ) Inferior infarct , age undetermined When compared with ECG of 13-Jun-1994 20:05, Inferior infarct is now Present ST now depressed in Inferior leads Inverted T waves have replaced nonspecific T wave abnormality in Inferior leads Confirmed by Heather James [phone]) on 02/18/2023 9:06:37 AM    PMH:   has a past medical history of Advanced care planning/counseling discussion (05/30/2017), Cancer (HCC), CSF leak from ear, Hypercholesteremia, Hypertension, Obesity, and Osteopenia.  PSH:    Past Surgical History:  Procedure Laterality Date   BREAST BIOPSY Left 11/21/1996   neg   BREAST BIOPSY Right 08/18/2021   affirm bx 2 area , x marker, path pending   BREAST SURGERY Left    cyst removed    COLONOSCOPY WITH PROPOFOL N/A 03/14/2018   Procedure: COLONOSCOPY WITH PROPOFOL;  Surgeon: Toledo, Boykin Nearing, MD;  Location: [facility] ENDOSCOPY;  Service: Gastroenterology;  Laterality: N/A;   FACIAL NERVE SURGERY     PILONIDAL CYST EXCISION     SKIN CANCER EXCISION Right    right leg, April 2017    Current Outpatient Medications  Medication Sig Dispense Refill   albuterol (VENTOLIN HFA) 108 (90 Base) MCG/ACT inhaler Inhale 2 puffs into the lungs every 6 (six) hours as needed for wheezing or shortness of breath. 18 g 6   aspirin 81 MG tablet Take 81 mg by mouth daily.     atorvastatin (LIPITOR) 20 MG tablet TAKE 1 TABLET(20 MG) BY MOUTH DAILY Strength: 20 mg 90 tablet 1   clobetasol ointment (TEMOVATE) 0.05 % Apply 1 Application topically 2 (two) times daily. 30 g 3   diclofenac sodium (VOLTAREN) 1 % GEL Apply 4 g topically 4 (four) times daily. 100 g 0   hydrochlorothiazide (HYDRODIURIL) 50 MG tablet Take 1 tablet (50 mg total) by mouth every other day. 90 tablet 1   hydrOXYzine (VISTARIL) 25 MG capsule Take 1 capsule (25 mg total) by mouth every 8 (eight) hours as needed. 90 capsule 2   meloxicam (MOBIC) 7.5 MG tablet Take 1 tablet (7.5 mg total) by mouth daily as needed. 90 tablet 1   mometasone (NASONEX) 50 MCG/ACT nasal spray Place 2 sprays into the nose daily. 17 g 12  Multiple Vitamins-Minerals (ESTROVEN MENOPAUSE SUPPLEMENT PO) Take by mouth at bedtime.     raloxifene (EVISTA) 60 MG tablet Take 1 tablet (60 mg total) by mouth daily. 90 tablet 3   montelukast (SINGULAIR) 10 MG tablet Take 1 tablet (10 mg total) by mouth at bedtime. (Patient not taking: Reported on 02/18/2023) 30 tablet 3   No current facility-administered medications for this visit.     Allergies:   Patient has no known allergies.   Social History:  The patient  reports that she has never smoked. She has never used smokeless tobacco. She reports that she does not drink alcohol and does not use drugs.   Family  History:   family history includes Breast cancer (age of onset: 75) in her mother; Cancer in her maternal grandmother and mother; Diabetes in her father and mother; Emphysema in her paternal grandfather; Hearing loss in her maternal grandfather; Heart attack (age of onset: 82) in her brother; Heart disease in her brother, father, maternal grandfather, mother, and paternal grandmother; Hypertension in her brother and mother.    Review of Systems: Review of Systems  Constitutional: Negative.   HENT: Negative.    Respiratory:  Positive for shortness of breath.   Cardiovascular:  Positive for chest pain.  Gastrointestinal: Negative.   Musculoskeletal: Negative.   Neurological: Negative.   Psychiatric/Behavioral: Negative.    All other systems reviewed and are negative.    PHYSICAL EXAM: VS:  BP 132/80 (BP Location: Right Arm, Patient Position: Sitting, Cuff Size: Large)   Pulse 79   Ht 5\' 2"  (1.575 m)   Wt 244 lb 4 oz (110.8 kg)   LMP  (LMP Unknown)   SpO2 98%   BMI 44.67 kg/m  , BMI Body mass index is 44.67 kg/m. GEN: Well nourished, well developed, in no acute distress HEENT: normal Neck: no JVD, carotid bruits, or masses Cardiac: RRR; no murmurs, rubs, or gallops,no edema  Respiratory:  clear to auscultation bilaterally, normal work of breathing GI: soft, nontender, nondistended, + BS MS: no deformity or atrophy Skin: warm and dry, no rash Neuro:  Strength and sensation are intact Psych: euthymic mood, full affect  Recent Labs: 12/16/2022: ALT 22; BUN 17; Creatinine, Ser 0.93; Hemoglobin 15.1; Platelets 257; Potassium 4.8; Sodium 143; TSH 1.960    Lipid Panel Lab Results  Component Value Date   CHOL 143 12/16/2022   HDL 67 12/16/2022   LDLCALC 63 12/16/2022   TRIG 63 12/16/2022      Wt Readings from Last 3 Encounters:  02/18/23 244 lb 4 oz (110.8 kg)  12/16/22 238 lb (108 kg)  11/19/22 245 lb 6.4 oz (111.3 kg)     ASSESSMENT AND PLAN:  Problem List Items  Addressed This Visit       Cardiology Problems   Hypertension - Primary   Relevant Orders   EKG 12-Lead (Completed)   Hypercholesteremia     Other   Morbid obesity (HCC)   Angina/chest pain with shortness of breath on exertion Strong family history of coronary disease History of hyperlipidemia Unable to treadmill, Poor candidate for Myoview given body habitus We have recommended cardiac CTA for further evaluation  Hyperlipidemia Continue Lipitor 20  Essential hypertension Blood pressure is well controlled on today's visit. No changes made to the medications.  Morbid obesity BMI 44 We have encouraged continued exercise, careful diet management in an effort to lose weight. Recommend regular walking program for conditioning   Total encounter time more than 50 minutes  Greater  than 50% was spent in counseling and coordination of care with the patient    Signed, Dossie Arbour, M.D., Ph.D. [facility] [place], [place] [phone]

## 2023-02-18 ENCOUNTER — Ambulatory Visit: Payer: PPO | Attending: Cardiovascular Disease | Admitting: Cardiovascular Disease

## 2023-02-18 ENCOUNTER — Encounter: Payer: Self-pay | Admitting: Cardiovascular Disease

## 2023-02-18 VITALS — BP 132/80 | HR 79 | Ht 62.0 in | Wt 244.2 lb

## 2023-02-18 DIAGNOSIS — E7800 Pure hypercholesterolemia, unspecified: Secondary | ICD-10-CM | POA: Diagnosis not present

## 2023-02-18 DIAGNOSIS — R072 Precordial pain: Secondary | ICD-10-CM | POA: Diagnosis not present

## 2023-02-18 DIAGNOSIS — I209 Angina pectoris, unspecified: Secondary | ICD-10-CM | POA: Diagnosis not present

## 2023-02-18 DIAGNOSIS — Z79899 Other long term (current) drug therapy: Secondary | ICD-10-CM | POA: Diagnosis not present

## 2023-02-18 DIAGNOSIS — I10 Essential (primary) hypertension: Secondary | ICD-10-CM | POA: Diagnosis not present

## 2023-02-18 MED ORDER — METOPROLOL TARTRATE 100 MG PO TABS: 100.0000 mg | ORAL_TABLET | Freq: Once | ORAL | 0 refills | Status: DC

## 2023-02-18 NOTE — Patient Instructions (Addendum)
Medication Instructions:  Metoprolol Tartrate 100 mg two hours prior to Cardiac CT  If you need a refill on your cardiac medications before your next appointment, please call your pharmacy.   Lab work: Your provider would like for you to have following labs drawn today BMP.    Testing/Procedures:     Your cardiac CT will be scheduled at one of the below locations:   [facility] [address] [place], [place] [address] [phone]  OR   [facility] [address] [place], [place] [address] [phone]   If scheduled at [facility] or [facility], please arrive 15 mins early for check-in and test prep.  There is spacious parking and easy access to the radiology department from the [facility] entrance. Please enter here and check-in with the desk attendant.   Please follow these instructions carefully (unless otherwise directed):  An IV will be required for this test and Nitroglycerin will be given.   On the Night Before the Test: Be sure to Drink plenty of water. Do not consume any caffeinated/decaffeinated beverages or chocolate 12 hours prior to your test. Do not take any antihistamines 12 hours prior to your test.  On the Day of the Test: Drink plenty of water until 1 hour prior to the test. Do not eat any food 1 hour prior to test. You may take your regular medications prior to the test.  Take metoprolol tartrate 100 mg (Lopressor) two hours prior to test. If you take Furosemide/Hydrochlorothiazide/Spironolactone, please HOLD on the morning of the test. FEMALES- please wear underwire-free bra if available, avoid dresses & tight clothing  After the Test: Drink plenty of water. After receiving IV contrast, you may experience a mild flushed feeling. This is normal. On occasion, you may experience a mild rash up to 24 hours after the  test. This is not dangerous. If this occurs, you can take Benadryl 25 mg and increase your fluid intake. If you experience trouble breathing, this can be serious. If it is severe call 911 IMMEDIATELY. If it is mild, please call our office. If you take any of these medications: Glipizide/Metformin, Avandament, Glucavance, please do not take 48 hours after completing test unless otherwise instructed.  We will call to schedule your test 2-4 weeks out understanding that some insurance companies will need an authorization prior to the service being performed.   For more information and frequently asked questions, please visit our website : [email or web address]  For non-scheduling related questions, please contact the cardiac imaging nurse navigator should you have any questions/concerns: Cardiac Imaging Nurse Navigators Direct Office Dial: [phone]   For scheduling needs, including cancellations and rescheduling, please call [place], [phone].   Follow-Up: At [facility], you and your health needs are our priority.  As part of our continuing mission to provide you with exceptional heart care, we have created designated Provider Care Teams.  These Care Teams include your primary Cardiologist (physician) and Advanced Practice Providers (APPs -  Physician Assistants and Nurse Practitioners) who all work together to provide you with the care you need, when you need it.  You will need a follow up appointment  as needed  Providers on your designated Care Team:   Nicolasa Ducking, NP Eula Listen, PA-C Cadence Fransico Michael, [place]  COVID-19 Vaccine Information can be found at: [email or web address] For questions related to vaccine distribution or appointments, please email [email or web address] or call [phone].

## 2023-02-19 LAB — BASIC METABOLIC PANEL
BUN/Creatinine Ratio: 22 (ref 12–28)
BUN: 22 mg/dL (ref 8–27)
CO2: 21 mmol/L (ref 20–29)
Calcium: 9.6 mg/dL (ref 8.7–10.3)
Chloride: 105 mmol/L (ref 96–106)
Creatinine, Ser: 1.02 mg/dL — ABNORMAL HIGH (ref 0.57–1.00)
Glucose: 94 mg/dL (ref 70–99)
Potassium: 4 mmol/L (ref 3.5–5.2)
Sodium: 144 mmol/L (ref 134–144)
eGFR: 58 mL/min/{1.73_m2} — ABNORMAL LOW (ref >59)

## 2023-02-22 DIAGNOSIS — Z86010 Personal history of colonic polyps: Secondary | ICD-10-CM | POA: Diagnosis not present

## 2023-02-22 DIAGNOSIS — Z6841 Body Mass Index (BMI) 40.0 and over, adult: Secondary | ICD-10-CM | POA: Diagnosis not present

## 2023-02-22 DIAGNOSIS — K5730 Diverticulosis of large intestine without perforation or abscess without bleeding: Secondary | ICD-10-CM | POA: Diagnosis not present

## 2023-02-23 ENCOUNTER — Ambulatory Visit: Payer: PPO | Admitting: Podiatry

## 2023-02-23 DIAGNOSIS — M722 Plantar fascial fibromatosis: Secondary | ICD-10-CM

## 2023-02-23 MED ORDER — DICLOFENAC SODIUM 75 MG PO TBEC: 75.0000 mg | DELAYED_RELEASE_TABLET | Freq: Two times a day (BID) | ORAL | 2 refills | Status: DC

## 2023-02-23 NOTE — Progress Notes (Signed)
Subjective:   Patient ID: Heather James, female   DOB: 72 y.o.   MRN: [id number]   HPI Patient presents stating the right heel has started to hurt again is worse when she gets up in the morning after periods of sitting   ROS      Objective:  Physical Exam  Neurovascular status intact diminishment of discomfort but still present within the medial arch and moderate to significant discomfort in the plantar fascia right with fluid buildup     Assessment:  Acute plantar fasciitis right improving but still having a lot of pain especially after periods of sitting     Plan:  H&P done and recommended night splint treatment and patient is dispensed night splint with all instructions on usage and it was properly fitted into her arch with instructions to sleep in it

## 2023-03-02 ENCOUNTER — Encounter (HOSPITAL_COMMUNITY): Payer: Self-pay

## 2023-03-07 ENCOUNTER — Ambulatory Visit: Admission: RE | Admit: 2023-03-07 | Payer: PPO | Source: Ambulatory Visit

## 2023-03-07 ENCOUNTER — Other Ambulatory Visit (HOSPITAL_COMMUNITY): Payer: Self-pay | Admitting: *Deleted

## 2023-03-07 ENCOUNTER — Other Ambulatory Visit: Payer: Self-pay | Admitting: Cardiovascular Disease

## 2023-03-07 MED ORDER — METOPROLOL TARTRATE 100 MG PO TABS: ORAL_TABLET | ORAL | 0 refills | Status: DC

## 2023-03-08 ENCOUNTER — Telehealth (HOSPITAL_COMMUNITY): Payer: Self-pay | Admitting: Emergency Medicine

## 2023-03-08 NOTE — Telephone Encounter (Signed)
Reaching out to patient to offer assistance regarding upcoming cardiac imaging study; pt verbalizes understanding of appt date/time, parking situation and where to check in, pre-test NPO status and medications ordered, and verified current allergies; name and call back number provided for further questions should they arise Sara Wallace RN Navigator Cardiac Imaging [facility] [phone] office [phone] cell 

## 2023-03-09 ENCOUNTER — Ambulatory Visit
Admission: RE | Admit: 2023-03-09 | Discharge: 2023-03-09 | Disposition: A | Discharge status: Home / Self Care | Payer: PPO | Source: Ambulatory Visit | Attending: Cardiovascular Disease | Admitting: Cardiovascular Disease

## 2023-03-09 DIAGNOSIS — R072 Precordial pain: Secondary | ICD-10-CM | POA: Insufficient documentation

## 2023-03-09 MED ORDER — IOHEXOL 350 MG/ML SOLN
80.0000 mL | Freq: Once | INTRAVENOUS | Status: AC | PRN
  Administered 2023-03-09: 80 mL via INTRAVENOUS

## 2023-03-09 MED ORDER — METOPROLOL TARTRATE 5 MG/5ML IV SOLN
10.0000 mg | Freq: Once | INTRAVENOUS | Status: AC
  Administered 2023-03-09: 10 mg via INTRAVENOUS

## 2023-03-09 MED ORDER — METOPROLOL TARTRATE 5 MG/5ML IV SOLN
INTRAVENOUS | Status: AC
  Filled 2023-03-09: qty 10

## 2023-03-09 MED ORDER — NITROGLYCERIN 0.4 MG SL SUBL
0.8000 mg | SUBLINGUAL_TABLET | Freq: Once | SUBLINGUAL | Status: AC
  Administered 2023-03-09: 0.8 mg via SUBLINGUAL

## 2023-03-09 NOTE — Progress Notes (Signed)

## 2023-03-16 ENCOUNTER — Ambulatory Visit: Payer: PPO | Admitting: Podiatry

## 2023-03-16 ENCOUNTER — Encounter: Payer: Self-pay | Admitting: Podiatry

## 2023-03-16 DIAGNOSIS — M722 Plantar fascial fibromatosis: Secondary | ICD-10-CM

## 2023-03-16 NOTE — Progress Notes (Signed)
Subjective:   Patient ID: Heather James, female   DOB: 72 y.o.   MRN: [id number]   HPI Patient states doing quite a bit better but probably needs new orthotics   ROS      Objective:  Physical Exam  Neurovascular status intact exquisite discomfort medial fascial band right that has improved quite dramatically with only mild pain with moderate depression of the arch     Assessment:  Chronic Planter fasciitis right doing better with night splint but still gives problems F2 H&P reviewed at great length discussed all the different conservative treatments that can be done and I had pedorthist look and we agree that orthotics would be best and she is casted for functional orthotic device by pedorthist     Plan:  Reviewed with the patient continue night splint usage with explanations

## 2023-04-04 ENCOUNTER — Telehealth: Payer: Self-pay | Admitting: Podiatry

## 2023-04-04 NOTE — Telephone Encounter (Signed)
Pt called asking if orthotics are in yet

## 2023-04-05 NOTE — Telephone Encounter (Signed)
Orthotics in appt set 10:16 8:30 am

## 2023-04-13 ENCOUNTER — Encounter: Payer: Self-pay | Admitting: Podiatry

## 2023-04-13 ENCOUNTER — Other Ambulatory Visit: Payer: PPO

## 2023-04-13 ENCOUNTER — Ambulatory Visit (INDEPENDENT_AMBULATORY_CARE_PROVIDER_SITE_OTHER): Payer: PPO | Admitting: Podiatry

## 2023-04-13 VITALS — Ht 62.0 in | Wt 244.0 lb

## 2023-04-13 DIAGNOSIS — M722 Plantar fascial fibromatosis: Secondary | ICD-10-CM

## 2023-04-13 NOTE — Progress Notes (Signed)
Orthotics dispensed functioning well.

## 2023-04-28 ENCOUNTER — Encounter: Payer: Self-pay | Admitting: Internal Medicine

## 2023-05-02 NOTE — Progress Notes (Unsigned)
   LMP  (LMP Unknown)    Subjective:    Patient ID: Heather James, female    DOB: Aug 16, 1950, 72 y.o.   MRN: [id number]  HPI: Heather James is a 72 y.o. female  No chief complaint on file.  UPPER RESPIRATORY TRACT INFECTION Worst symptom: Fever: {Blank single:19197::"yes","no"} Cough: {Blank single:19197::"yes","no"} Shortness of breath: {Blank single:19197::"yes","no"} Wheezing: {Blank single:19197::"yes","no"} Chest pain: {Blank single:19197::"yes","no","yes, with cough"} Chest tightness: {Blank single:19197::"yes","no"} Chest congestion: {Blank single:19197::"yes","no"} Nasal congestion: {Blank single:19197::"yes","no"} Runny nose: {Blank single:19197::"yes","no"} Post nasal drip: {Blank single:19197::"yes","no"} Sneezing: {Blank single:19197::"yes","no"} Sore throat: {Blank single:19197::"yes","no"} Swollen glands: {Blank single:19197::"yes","no"} Sinus pressure: {Blank single:19197::"yes","no"} Headache: {Blank single:19197::"yes","no"} Face pain: {Blank single:19197::"yes","no"} Toothache: {Blank single:19197::"yes","no"} Ear pain: {Blank single:19197::"yes","no"} {Blank single:19197::""right","left", "bilateral"} Ear pressure: {Blank single:19197::"yes","no"} {Blank single:19197::""right","left", "bilateral"} Eyes red/itching:{Blank single:19197::"yes","no"} Eye drainage/crusting: {Blank single:19197::"yes","no"}  Vomiting: {Blank single:19197::"yes","no"} Rash: {Blank single:19197::"yes","no"} Fatigue: {Blank single:19197::"yes","no"} Sick contacts: {Blank single:19197::"yes","no"} Strep contacts: {Blank single:19197::"yes","no"}  Context: {Blank multiple:19196::"better","worse","stable","fluctuating"} Recurrent sinusitis: {Blank single:19197::"yes","no"} Relief with OTC cold/cough medications: {Blank single:19197::"yes","no"}  Treatments attempted: {Blank multiple:19196::"none","cold/sinus","mucinex","anti-histamine","pseudoephedrine","cough  syrup","antibiotics"}   Relevant past medical, surgical, family and social history reviewed and updated as indicated. Interim medical history since our last visit reviewed. Allergies and medications reviewed and updated.  Review of Systems  Per HPI unless specifically indicated above     Objective:    LMP  (LMP Unknown)   Wt Readings from Last 3 Encounters:  04/13/23 244 lb (110.7 kg)  02/18/23 244 lb 4 oz (110.8 kg)  12/16/22 238 lb (108 kg)    Physical Exam  Results for orders placed or performed in visit on 02/18/23  Basic metabolic panel  Result Value Ref Range   Glucose 94 70 - 99 mg/dL   BUN 22 8 - 27 mg/dL   Creatinine, Ser 7.82 (H) 0.57 - 1.00 mg/dL   eGFR 58 (L) >95 AO/ZHY/8.65   BUN/Creatinine Ratio 22 12 - 28   Sodium 144 134 - 144 mmol/L   Potassium 4.0 3.5 - 5.2 mmol/L   Chloride 105 96 - 106 mmol/L   CO2 21 20 - 29 mmol/L   Calcium 9.6 8.7 - 10.3 mg/dL      Assessment & Plan:   Problem List Items Addressed This Visit   None    Follow up plan: No follow-ups on file.

## 2023-05-03 ENCOUNTER — Ambulatory Visit (INDEPENDENT_AMBULATORY_CARE_PROVIDER_SITE_OTHER): Payer: PPO | Admitting: Nurse Practitioner

## 2023-05-03 ENCOUNTER — Encounter: Payer: Self-pay | Admitting: Nurse Practitioner

## 2023-05-03 VITALS — BP 128/82 | HR 83 | Temp 98.4°F | Ht 62.0 in | Wt 246.2 lb

## 2023-05-03 DIAGNOSIS — J069 Acute upper respiratory infection, unspecified: Secondary | ICD-10-CM

## 2023-05-03 MED ORDER — HYDROCOD POLI-CHLORPHE POLI ER 10-8 MG/5ML PO SUER: 5.0000 mL | Freq: Every evening | ORAL | 0 refills | Status: DC | PRN

## 2023-05-03 MED ORDER — PREDNISONE 10 MG PO TABS: 10.0000 mg | ORAL_TABLET | Freq: Every day | ORAL | 0 refills | Status: DC

## 2023-05-04 ENCOUNTER — Encounter: Payer: Self-pay | Admitting: Internal Medicine

## 2023-05-11 ENCOUNTER — Ambulatory Visit
Admission: RE | Admit: 2023-05-11 | Discharge: 2023-05-11 | Disposition: A | Discharge status: Home / Self Care | Payer: PPO | Attending: Internal Medicine | Admitting: Internal Medicine

## 2023-05-11 ENCOUNTER — Ambulatory Visit: Payer: PPO | Admitting: Certified Registered"

## 2023-05-11 ENCOUNTER — Encounter
Admission: RE | Disposition: A | Discharge status: Home / Self Care | Payer: Self-pay | Source: Home / Self Care | Attending: Internal Medicine

## 2023-05-11 ENCOUNTER — Encounter: Payer: Self-pay | Admitting: Internal Medicine

## 2023-05-11 ENCOUNTER — Other Ambulatory Visit: Payer: Self-pay

## 2023-05-11 DIAGNOSIS — Z09 Encounter for follow-up examination after completed treatment for conditions other than malignant neoplasm: Secondary | ICD-10-CM | POA: Diagnosis not present

## 2023-05-11 DIAGNOSIS — M8580 Other specified disorders of bone density and structure, unspecified site: Secondary | ICD-10-CM | POA: Diagnosis not present

## 2023-05-11 DIAGNOSIS — Z1211 Encounter for screening for malignant neoplasm of colon: Secondary | ICD-10-CM | POA: Insufficient documentation

## 2023-05-11 DIAGNOSIS — Z860101 Personal history of adenomatous and serrated colon polyps: Secondary | ICD-10-CM | POA: Insufficient documentation

## 2023-05-11 DIAGNOSIS — Z8249 Family history of ischemic heart disease and other diseases of the circulatory system: Secondary | ICD-10-CM | POA: Insufficient documentation

## 2023-05-11 DIAGNOSIS — E7800 Pure hypercholesterolemia, unspecified: Secondary | ICD-10-CM | POA: Diagnosis not present

## 2023-05-11 DIAGNOSIS — I10 Essential (primary) hypertension: Secondary | ICD-10-CM | POA: Insufficient documentation

## 2023-05-11 DIAGNOSIS — K640 First degree hemorrhoids: Secondary | ICD-10-CM | POA: Insufficient documentation

## 2023-05-11 DIAGNOSIS — K5730 Diverticulosis of large intestine without perforation or abscess without bleeding: Secondary | ICD-10-CM | POA: Diagnosis not present

## 2023-05-11 DIAGNOSIS — E66813 Obesity, class 3: Secondary | ICD-10-CM | POA: Insufficient documentation

## 2023-05-11 DIAGNOSIS — K648 Other hemorrhoids: Secondary | ICD-10-CM | POA: Diagnosis not present

## 2023-05-11 HISTORY — PX: COLONOSCOPY WITH PROPOFOL: SHX5780

## 2023-05-11 SURGERY — COLONOSCOPY WITH PROPOFOL
Anesthesia: General

## 2023-05-11 MED ORDER — LIDOCAINE HCL (CARDIAC) PF 100 MG/5ML IV SOSY
PREFILLED_SYRINGE | INTRAVENOUS | Status: DC | PRN
  Administered 2023-05-11: 80 mg via INTRAVENOUS

## 2023-05-11 MED ORDER — PROPOFOL 500 MG/50ML IV EMUL
INTRAVENOUS | Status: DC | PRN
  Administered 2023-05-11: 100 ug/kg/min via INTRAVENOUS

## 2023-05-11 MED ORDER — PROPOFOL 10 MG/ML IV BOLUS
INTRAVENOUS | Status: DC | PRN
  Administered 2023-05-11: 80 mg via INTRAVENOUS
  Administered 2023-05-11: 20 mg via INTRAVENOUS

## 2023-05-11 MED ORDER — SODIUM CHLORIDE 0.9 % IV SOLN: INTRAVENOUS | Status: DC

## 2023-05-11 NOTE — Transfer of Care (Signed)
Immediate Anesthesia Transfer of Care Note  Patient: Heather James  Procedure(s) Performed: COLONOSCOPY WITH PROPOFOL  Patient Location: PACU  Anesthesia Type:General  Level of Consciousness: awake, alert , and oriented  Airway & Oxygen Therapy: Patient Spontanous Breathing  Post-op Assessment: Report given to RN and Post -op Vital signs reviewed and stable  Post vital signs: Reviewed and stable  Last Vitals: See flow sheet for normal temp.   Pt awake and talking.  Dreamed about [facility].   Vitals Value Taken Time  BP 125/65 05/11/23 1055  Temp    Pulse 85 05/11/23 1056  Resp 25 05/11/23 1056  SpO2 98 % 05/11/23 1056  Vitals shown include unfiled device data.  Last Pain:  Vitals:   05/11/23 1055  TempSrc:   PainSc: 0-No pain         Complications: No notable events documented.

## 2023-05-11 NOTE — H&P (Signed)
Outpatient short stay form Pre-procedure 05/11/2023 9:43 AM [name] Norma Fredrickson, M.D.  Primary Physician: Olevia Perches, D.O.  Reason for visit:  Personal history of adenomatous colon polyps (2019)  History of present illness:  [name] presents to the [facility] GI clinic at the request of her PCP for chief complaint of high-risk colon cancer surveillance 2/2 personal hx of adenomatous colon polyps. Last colonoscopy Sept 2019 performed by Dr. Norma Fredrickson showed sigmoid diverticulosis and removed one 7 mm TA from transverse colon. She denies any known family hx of colorectal cancer, advanced adenomas, or IBD. She denies any acute GI complaints or concerns at this time. She denies any recent changes in her bowel habits. Bowels are moving regularly without any fecal urgency, fecal incontinence, hematochezia, or melena. She denies any complaints of abdominal pain or abdominal cramping. She denies any issues with loss of appetite or unintentional weight loss. She has no UGI symptoms such as heartburn, acid reflux, dysphagia, odynophagia, early satiety, hoarseness, or epigastric abdominal pain. She offers no other specific complaints or concerns at this time. She saw Dr. Mariah Milling recently in Cardiology and was advised to undergo cardiac CT scan due to strong family hx of CAD and mild SOBOE.    No current facility-administered medications for this encounter.  Medications Prior to Admission  Medication Sig Dispense Refill Last Dose   albuterol (VENTOLIN HFA) 108 (90 Base) MCG/ACT inhaler Inhale 2 puffs into the lungs every 6 (six) hours as needed for wheezing or shortness of breath. 18 g 6    aspirin 81 MG tablet Take 81 mg by mouth daily.      atorvastatin (LIPITOR) 20 MG tablet TAKE 1 TABLET(20 MG) BY MOUTH DAILY Strength: 20 mg 90 tablet 1    chlorpheniramine-HYDROcodone (TUSSIONEX) 10-8 MG/5ML Take 5 mLs by mouth at bedtime as needed for cough. 115 mL 0    clobetasol ointment (TEMOVATE) 0.05 % Apply 1  Application topically 2 (two) times daily. 30 g 3    diclofenac (VOLTAREN) 75 MG EC tablet Take 1 tablet (75 mg total) by mouth 2 (two) times daily. 50 tablet 2    diclofenac sodium (VOLTAREN) 1 % GEL Apply 4 g topically 4 (four) times daily. 100 g 0    hydrochlorothiazide (HYDRODIURIL) 50 MG tablet Take 1 tablet (50 mg total) by mouth every other day. 90 tablet 1    hydrOXYzine (VISTARIL) 25 MG capsule Take 1 capsule (25 mg total) by mouth every 8 (eight) hours as needed. 90 capsule 2    meloxicam (MOBIC) 7.5 MG tablet Take 1 tablet (7.5 mg total) by mouth daily as needed. 90 tablet 1    metoprolol tartrate (LOPRESSOR) 100 MG tablet TAKE 1 TABLET BY MOUTH AS 1 DOSE 1 tablet 0    metoprolol tartrate (LOPRESSOR) 100 MG tablet Take tablet (100mg ) TWO hours prior to your cardiac CT scan. 1 tablet 0    mometasone (NASONEX) 50 MCG/ACT nasal spray Place 2 sprays into the nose daily. 17 g 12    montelukast (SINGULAIR) 10 MG tablet Take 1 tablet (10 mg total) by mouth at bedtime. 30 tablet 3    Multiple Vitamins-Minerals (ESTROVEN MENOPAUSE SUPPLEMENT PO) Take by mouth at bedtime.      predniSONE (DELTASONE) 10 MG tablet Take 1 tablet (10 mg total) by mouth daily with breakfast. Take 6 today, 5 tomorrow, and decrease by 1 each day until tomorrow 21 tablet 0    raloxifene (EVISTA) 60 MG tablet Take 1 tablet (60 mg total) by  mouth daily. 90 tablet 3      No Known Allergies   Past Medical History:  Diagnosis Date   Advanced care planning/counseling discussion 05/30/2017   Cancer (HCC)    skin ca on leg   CSF leak from ear    Hypercholesteremia    Hypertension    Obesity    Osteopenia     Review of systems:  Otherwise negative.    Physical Exam  Gen: Alert, oriented. Appears stated age.  HEENT: [place]/AT. PERRLA. Lungs: CTA, no wheezes. CV: RR nl S1, S2. Abd: soft, benign, no masses. BS+ Ext: No edema. Pulses 2+    Planned procedures: Proceed with colonoscopy. The patient understands the  nature of the planned procedure, indications, risks, alternatives and potential complications including but not limited to bleeding, infection, perforation, damage to internal organs and possible oversedation/side effects from anesthesia. The patient agrees and gives consent to proceed.  Please refer to procedure notes for findings, recommendations and patient disposition/instructions.     Dulce Martian K. Norma Fredrickson, M.D. Gastroenterology 05/11/2023  9:43 AM

## 2023-05-11 NOTE — Op Note (Signed)
[facility] Gastroenterology Patient Name: Heather James Procedure Date: 05/11/2023 10:35 AM MRN: [id number] Account #: [id number] Date of Birth: [birth year] Admit Type: Outpatient Age: 73 Room: [facility] ENDO ROOM 2 Gender: Female Note Status: Finalized Instrument Name: Prentice Docker [id number] Procedure:             Colonoscopy Indications:           High risk colon cancer surveillance: Personal history                         of non-advanced adenoma Providers:             Boykin Nearing. Norma Fredrickson MD, MD Referring MD:          Boykin Nearing. Norma Fredrickson MD, MD (Referring MD), Dorcas Carrow (Referring MD) Medicines:             Propofol per Anesthesia Complications:         No immediate complications. Procedure:             Pre-Anesthesia Assessment:                        - The risks and benefits of the procedure and the                         sedation options and risks were discussed with the                         patient. All questions were answered and informed                         consent was obtained.                        - Patient identification and proposed procedure were                         verified prior to the procedure by the nurse. The                         procedure was verified in the procedure room.                        - ASA Grade Assessment: III - A patient with severe                         systemic disease.                        - After reviewing the risks and benefits, the patient                         was deemed in satisfactory condition to undergo the                         procedure.                        After obtaining informed  consent, the colonoscope was                         passed under direct vision. Throughout the procedure,                         the patient's blood pressure, pulse, and oxygen                         saturations were monitored continuously. The                         Colonoscope was  introduced through the anus and                         advanced to the the cecum, identified by appendiceal                         orifice and ileocecal valve. The colonoscopy was                         performed without difficulty. The patient tolerated                         the procedure well. The quality of the bowel                         preparation was good. The ileocecal valve, appendiceal                         orifice, and rectum were photographed. Findings:      The perianal and digital rectal examinations were normal. Pertinent       negatives include normal sphincter tone and no palpable rectal lesions.      Non-bleeding internal hemorrhoids were found during retroflexion. The       hemorrhoids were Grade I (internal hemorrhoids that do not prolapse).      Many medium-mouthed and small-mouthed diverticula were found in the       sigmoid colon.      The exam was otherwise without abnormality. Impression:            - Non-bleeding internal hemorrhoids.                        - Diverticulosis in the sigmoid colon.                        - The examination was otherwise normal.                        - No specimens collected. Recommendation:        - Patient has a contact number available for                         emergencies. The signs and symptoms of potential                         delayed complications were discussed with the patient.  Return to normal activities tomorrow. Written                         discharge instructions were provided to the patient.                        - High fiber diet.                        - Continue present medications.                        - You do NOT require further colon cancer screening                         measures (Annual stool testing (i.e. hemoccult, FIT,                         cologuard), sigmoidoscopy, colonoscopy or CT                         colonography). You should share this recommendation                          with your Primary Care provider.                        - Return to GI office PRN.                        - The findings and recommendations were discussed with                         the patient. Procedure Code(s):     --- Professional ---                        N5621, Colorectal cancer screening; colonoscopy on                         individual at high risk Diagnosis Code(s):     --- Professional ---                        K57.30, Diverticulosis of large intestine without                         perforation or abscess without bleeding                        K64.0, First degree hemorrhoids                        Z86.010, Personal history of colonic polyps CPT copyright 2022 [employer]. All rights reserved. The codes documented in this report are preliminary and upon coder review may  be revised to meet current compliance requirements. Stanton Kidney MD, MD 05/11/2023 10:56:51 AM This report has been signed electronically. Number of Addenda: 0 Note Initiated On: 05/11/2023 10:35 AM Scope Withdrawal Time: 0 hours 6 minutes 12 seconds  Total Procedure Duration: 0 hours 10 minutes 31 seconds  Estimated Blood Loss:  Estimated blood loss: none.  [facility]

## 2023-05-11 NOTE — Anesthesia Preprocedure Evaluation (Addendum)
Anesthesia Evaluation  Patient identified by MRN, date of birth, ID band Patient awake    Reviewed: Allergy & Precautions, NPO status , Patient's Chart, lab work & pertinent test results  History of Anesthesia Complications Negative for: history of anesthetic complications  Airway Mallampati: III  TM Distance: <3 FB Neck ROM: full    Dental  (+) Chipped   Pulmonary neg pulmonary ROS, neg shortness of breath   Pulmonary exam normal        Cardiovascular Exercise Tolerance: Good hypertension, (-) angina Normal cardiovascular exam     Neuro/Psych negative neurological ROS  negative psych ROS   GI/Hepatic negative GI ROS, Neg liver ROS,neg GERD  ,,  Endo/Other    Class 3 obesity  Renal/GU negative Renal ROS  negative genitourinary   Musculoskeletal   Abdominal   Peds  Hematology negative hematology ROS (+)   Anesthesia Other Findings Facial asymmetry   Past Medical History: 05/30/2017: Advanced care planning/counseling discussion No date: Cancer (HCC)     Comment:  skin ca on leg No date: CSF leak from ear No date: Hypercholesteremia No date: Hypertension No date: Obesity No date: Osteopenia  Past Surgical History: 11/21/1996: BREAST BIOPSY; Left     Comment:  neg 08/18/2021: BREAST BIOPSY; Right     Comment:  affirm bx 2 area , x marker, path pending No date: BREAST SURGERY; Left     Comment:  cyst removed 03/14/2018: COLONOSCOPY WITH PROPOFOL; N/A     Comment:  Procedure: COLONOSCOPY WITH PROPOFOL;  Surgeon: [place],               Boykin Nearing, MD;  Location: [facility] ENDOSCOPY;  Service:               Gastroenterology;  Laterality: N/A; No date: FACIAL NERVE SURGERY No date: PILONIDAL CYST EXCISION No date: SKIN CANCER EXCISION; Right     Comment:  right leg, April 2017 No date: TRIGGER FINGER RELEASE; Right  BMI    Body Mass Index: 43.74 kg/m      Reproductive/Obstetrics negative OB ROS                              Anesthesia Physical Anesthesia Plan  ASA: 3  Anesthesia Plan: General   Post-op Pain Management:    Induction: Intravenous  PONV Risk Score and Plan: Propofol infusion and TIVA  Airway Management Planned: Natural Airway and Nasal Cannula  Additional Equipment:   Intra-op Plan:   Post-operative Plan:   Informed Consent: I have reviewed the patients History and Physical, chart, labs and discussed the procedure including the risks, benefits and alternatives for the proposed anesthesia with the patient or authorized representative who has indicated his/her understanding and acceptance.     Dental Advisory Given  Plan Discussed with: Anesthesiologist, CRNA and Surgeon  Anesthesia Plan Comments: (Patient consented for risks of anesthesia including but not limited to:  - adverse reactions to medications - risk of airway placement if required - damage to eyes, teeth, lips or other oral mucosa - nerve damage due to positioning  - sore throat or hoarseness - Damage to heart, brain, nerves, lungs, other parts of body or loss of life  Patient voiced understanding and assent.)       Anesthesia Quick Evaluation

## 2023-05-11 NOTE — Anesthesia Postprocedure Evaluation (Signed)
Anesthesia Post Note  Patient: Heather James  Procedure(s) Performed: COLONOSCOPY WITH PROPOFOL  Patient location during evaluation: Endoscopy Anesthesia Type: General Level of consciousness: awake and alert Pain management: pain level controlled Vital Signs Assessment: post-procedure vital signs reviewed and stable Respiratory status: spontaneous breathing, nonlabored ventilation, respiratory function stable and patient connected to nasal cannula oxygen Cardiovascular status: blood pressure returned to baseline and stable Postop Assessment: no apparent nausea or vomiting Anesthetic complications: no   No notable events documented.   Last Vitals:  Vitals:   05/11/23 1055 05/11/23 1105  BP: 125/65 137/75  Pulse: 86 82  Resp: 15 18  Temp:    SpO2: 97% 98%    Last Pain:  Vitals:   05/11/23 1105  TempSrc:   PainSc: 0-No pain                 Cleda Mccreedy Ariyon Mittleman

## 2023-05-11 NOTE — Interval H&P Note (Signed)
History and Physical Interval Note:  05/11/2023 9:43 AM  Heather James  has presented today for surgery, with the diagnosis of V12.72 (ICD-9-CM) - Z86.010 (ICD-10-CM) - Hx of adenomatous colonic polyps.  The various methods of treatment have been discussed with the patient and family. After consideration of risks, benefits and other options for treatment, the patient has consented to  Procedure(s): COLONOSCOPY WITH PROPOFOL (N/A) as a surgical intervention.  The patient's history has been reviewed, patient examined, no change in status, stable for surgery.  I have reviewed the patient's chart and labs.  Questions were answered to the patient's satisfaction.     [place], [place]

## 2023-05-12 ENCOUNTER — Encounter: Payer: Self-pay | Admitting: Internal Medicine

## 2023-06-17 ENCOUNTER — Ambulatory Visit (INDEPENDENT_AMBULATORY_CARE_PROVIDER_SITE_OTHER): Payer: PPO | Admitting: Family Medicine

## 2023-06-17 ENCOUNTER — Encounter: Payer: Self-pay | Admitting: Family Medicine

## 2023-06-17 VITALS — BP 128/81 | HR 74 | Temp 98.3°F | Resp 16 | Wt 243.8 lb

## 2023-06-17 DIAGNOSIS — R232 Flushing: Secondary | ICD-10-CM

## 2023-06-17 DIAGNOSIS — Z0000 Encounter for general adult medical examination without abnormal findings: Secondary | ICD-10-CM

## 2023-06-17 DIAGNOSIS — I10 Essential (primary) hypertension: Secondary | ICD-10-CM | POA: Diagnosis not present

## 2023-06-17 DIAGNOSIS — E7800 Pure hypercholesterolemia, unspecified: Secondary | ICD-10-CM | POA: Diagnosis not present

## 2023-06-17 MED ORDER — MELOXICAM 7.5 MG PO TABS: 7.5000 mg | ORAL_TABLET | Freq: Every day | ORAL | 1 refills | Status: AC | PRN

## 2023-06-17 MED ORDER — MONTELUKAST SODIUM 10 MG PO TABS: 10.0000 mg | ORAL_TABLET | Freq: Every day | ORAL | 1 refills | Status: DC

## 2023-06-17 MED ORDER — RALOXIFENE HCL 60 MG PO TABS: 60.0000 mg | ORAL_TABLET | Freq: Every day | ORAL | 3 refills | Status: DC

## 2023-06-17 MED ORDER — ATORVASTATIN CALCIUM 20 MG PO TABS: ORAL_TABLET | ORAL | 1 refills | Status: DC

## 2023-06-17 MED ORDER — ALBUTEROL SULFATE HFA 108 (90 BASE) MCG/ACT IN AERS: 2.0000 | INHALATION_SPRAY | Freq: Four times a day (QID) | RESPIRATORY_TRACT | 6 refills | Status: DC | PRN

## 2023-06-17 MED ORDER — MOMETASONE FUROATE 50 MCG/ACT NA SUSP: 2.0000 | Freq: Every day | NASAL | 12 refills | Status: AC

## 2023-06-17 MED ORDER — HYDROXYZINE PAMOATE 25 MG PO CAPS: 25.0000 mg | ORAL_CAPSULE | Freq: Three times a day (TID) | ORAL | 2 refills | Status: DC | PRN

## 2023-06-17 MED ORDER — HYDROCHLOROTHIAZIDE 50 MG PO TABS: 50.0000 mg | ORAL_TABLET | ORAL | 1 refills | Status: DC

## 2023-06-17 NOTE — Assessment & Plan Note (Signed)
Under good control on current regimen. Continue current regimen. Continue to monitor. Call with any concerns. Refills given. Labs drawn today.   

## 2023-06-17 NOTE — Patient Instructions (Signed)
Preventative Services:  Health Risk Assessment and Personalized Prevention Plan: Done today Bone Mass Measurements: Scheduled Breast Cancer Screening: Scheduled CVD Screening: Done today Cervical Cancer Screening: N/A Colon Cancer Screening: Up to date Depression Screening: Up to date Diabetes Screening: Done today Glaucoma Screening: See your eye doctor Hepatitis B vaccine: N/A Hepatitis C screening: up to date HIV Screening: Up to date Flu Vaccine: Up to date Lung cancer Screening: N/A Obesity Screening: Done today Pneumonia Vaccines (2): Up to date STI Screening: N/A

## 2023-06-17 NOTE — Progress Notes (Signed)
BP 128/81 (BP Location: Left Wrist, Patient Position: Sitting, Cuff Size: Normal)   Pulse 74   Temp 98.3 F (36.8 C) (Oral)   Resp 16   Wt 243 lb 12.8 oz (110.6 kg)   LMP  (LMP Unknown)   SpO2 97%   BMI 43.88 kg/m    Subjective:    Patient ID: Heather James, female    DOB: [birth year], 72 y.o.   MRN: [id number]  HPI: Heather James is a 72 y.o. female presenting on 06/17/2023 for comprehensive medical examination. Current medical complaints include:  HYPERTENSION / HYPERLIPIDEMIA Satisfied with current treatment? yes Duration of hypertension: chronic BP monitoring frequency: not checking BP medication side effects: no Past BP meds: hydrochlorothiazide Duration of hyperlipidemia: chronic Cholesterol medication side effects: no Cholesterol supplements: none Past cholesterol medications: atorvastatin Medication compliance: excellent compliance Aspirin: yes Recent stressors: no Recurrent headaches: no Visual changes: no Palpitations: no Dyspnea: no Chest pain: no Lower extremity edema: no Dizzy/lightheaded: no  Menopausal Symptoms: no  Functional Status Survey: Is the patient deaf or have difficulty hearing?: No Does the patient have difficulty seeing, even when wearing glasses/contacts?: No Does the patient have difficulty concentrating, remembering, or making decisions?: No Does the patient have difficulty walking or climbing stairs?: No Does the patient have difficulty dressing or bathing?: No Does the patient have difficulty doing errands alone such as visiting a doctor's office or shopping?: No     06/17/2023    8:05 AM 12/16/2022    8:07 AM 11/19/2022    8:15 AM 11/05/2022   10:59 AM 07/26/2022    1:37 PM  Fall Risk   Falls in the past year? 0 0 0 0 0  Number falls in past yr: 0 0 0 0 0  Injury with Fall? 0 0 0 0 0  Risk for fall due to : No Fall Risks No Fall Risks No Fall Risks No Fall Risks No Fall Risks  Follow up  Falls evaluation completed  Falls evaluation completed Falls evaluation completed Falls evaluation completed    Depression Screen    06/17/2023    8:05 AM 05/03/2023    1:49 PM 12/16/2022    8:07 AM 11/19/2022    8:15 AM 11/05/2022   11:00 AM  Depression screen PHQ 2/9  Decreased Interest 0 0 0 0 0  Down, Depressed, Hopeless 0 0 0 0 0  PHQ - 2 Score 0 0 0 0 0  Altered sleeping 0 1 0 0 0  Tired, decreased energy 0 0 0 0 0  Change in appetite 0 0 0 0 0  Feeling bad or failure about yourself  0 0 0 0 0  Trouble concentrating 0 0 0 0 0  Moving slowly or fidgety/restless 0 0 0 0 0  Suicidal thoughts 0 0 0 0 0  PHQ-9 Score 0 1 0 0 0  Difficult doing work/chores Not difficult at all  Not difficult at all Not difficult at all Not difficult at all     Advanced Directives Does patient have a HCPOA?    yes If yes, name and contact information:  Does patient have a living will or MOST form?  yes  Past Medical History:  Past Medical History:  Diagnosis Date   Advanced care planning/counseling discussion 05/30/2017   Cancer (HCC)    skin ca on leg   CSF leak from ear    Hypercholesteremia    Hypertension    Obesity    Osteopenia  Surgical History:  Past Surgical History:  Procedure Laterality Date   BREAST BIOPSY Left 11/21/1996   neg   BREAST BIOPSY Right 08/18/2021   affirm bx 2 area , x marker, path pending   BREAST SURGERY Left    cyst removed   COLONOSCOPY WITH PROPOFOL N/A 03/14/2018   Procedure: COLONOSCOPY WITH PROPOFOL;  Surgeon: Toledo, Boykin Nearing, MD;  Location: [facility];  Service: Gastroenterology;  Laterality: N/A;   COLONOSCOPY WITH PROPOFOL N/A 05/11/2023   Procedure: COLONOSCOPY WITH PROPOFOL;  Surgeon: Toledo, Boykin Nearing, MD;  Location: [facility];  Service: Gastroenterology;  Laterality: N/A;   FACIAL NERVE SURGERY     PILONIDAL CYST EXCISION     SKIN CANCER EXCISION Right    right leg, April 2017   TRIGGER FINGER RELEASE Right     Medications:  Current Outpatient  Medications on File Prior to Visit  Medication Sig   aspirin 81 MG tablet Take 81 mg by mouth daily.   clobetasol ointment (TEMOVATE) 0.05 % Apply 1 Application topically 2 (two) times daily.   diclofenac (VOLTAREN) 75 MG EC tablet Take 1 tablet (75 mg total) by mouth 2 (two) times daily.   diclofenac sodium (VOLTAREN) 1 % GEL Apply 4 g topically 4 (four) times daily.   Multiple Vitamins-Minerals (ESTROVEN MENOPAUSE SUPPLEMENT PO) Take by mouth at bedtime.   No current facility-administered medications on file prior to visit.    Allergies:  No Known Allergies  Social History:  Social History   Socioeconomic History   Marital status: Married    Spouse name: Not on file   Number of children: Not on file   Years of education: 12   Highest education level: 12th grade  Occupational History   Occupation: retired  Tobacco Use   Smoking status: Never   Smokeless tobacco: Never  Vaping Use   Vaping status: Never Used  Substance and Sexual Activity   Alcohol use: Not Currently   Drug use: No   Sexual activity: Not Currently    Birth control/protection: None  Other Topics Concern   Not on file  Social History Narrative   Not on file   Social Drivers of Health   Financial Resource Strain: Low Risk  (06/14/2023)   Overall Financial Resource Strain (CARDIA)    Difficulty of Paying Living Expenses: Not hard at all  Food Insecurity: No Food Insecurity (06/14/2023)   Hunger Vital Sign    Worried About Running Out of Food in the Last Year: Never true    Ran Out of Food in the Last Year: Never true  Transportation Needs: No Transportation Needs (06/14/2023)   PRAPARE - Administrator, Civil Service (Medical): No    Lack of Transportation (Non-Medical): No  Physical Activity: Insufficiently Active (06/14/2023)   Exercise Vital Sign    Days of Exercise per Week: 2 days    Minutes of Exercise per Session: 30 min  Stress: No Stress Concern Present (06/14/2023)   [employer] of Occupational Health - Occupational Stress Questionnaire    Feeling of Stress : Not at all  Social Connections: Socially Integrated (06/14/2023)   Social Connection and Isolation Panel [NHANES]    Frequency of Communication with Friends and Family: More than three times a week    Frequency of Social Gatherings with Friends and Family: Twice a week    Attends Religious Services: More than 4 times per year    Active Member of [employer] or Organizations: Yes  Attends Banker Meetings: 1 to 4 times per year    Marital Status: Married  Catering manager Violence: Not At Risk (06/11/2021)   Humiliation, Afraid, Rape, and Kick questionnaire    Fear of Current or Ex-Partner: No    Emotionally Abused: No    Physically Abused: No    Sexually Abused: No   Social History   Tobacco Use  Smoking Status Never  Smokeless Tobacco Never   Social History   Substance and Sexual Activity  Alcohol Use Not Currently    Family History:  Family History  Problem Relation Age of Onset   Heart disease Mother    Diabetes Mother    Hypertension Mother    Breast cancer Mother 109   Cancer Mother    Heart disease Father    Diabetes Father    Heart attack Brother 80       CABG x 3   Heart disease Brother    Hypertension Brother    Cancer Maternal Grandmother        spine   Heart disease Maternal Grandfather    Hearing loss Maternal Grandfather    Heart disease Paternal Grandmother    Emphysema Paternal Grandfather     Past medical history, surgical history, medications, allergies, family history and social history reviewed with patient today and changes made to appropriate areas of the chart.   Review of Systems  Constitutional:  Positive for diaphoresis. Negative for chills, fever, malaise/fatigue and weight loss.  HENT: Negative.    Respiratory: Negative.    Cardiovascular: Negative.   Musculoskeletal: Negative.   Skin: Negative.   Psychiatric/Behavioral: Negative.       All other ROS negative except what is listed above and in the HPI.      Objective:    BP 128/81 (BP Location: Left Wrist, Patient Position: Sitting, Cuff Size: Normal)   Pulse 74   Temp 98.3 F (36.8 C) (Oral)   Resp 16   Wt 243 lb 12.8 oz (110.6 kg)   LMP  (LMP Unknown)   SpO2 97%   BMI 43.88 kg/m   Wt Readings from Last 3 Encounters:  06/17/23 243 lb 12.8 oz (110.6 kg)  05/11/23 243 lb (110.2 kg)  05/03/23 246 lb 3.2 oz (111.7 kg)    No results found.  Physical Exam Vitals and nursing note reviewed.  Constitutional:      General: She is not in acute distress.    Appearance: Normal appearance. She is obese. She is not ill-appearing, toxic-appearing or diaphoretic.  HENT:     Head: Normocephalic and atraumatic.     Right Ear: External ear normal.     Left Ear: External ear normal.     Nose: Nose normal.     Mouth/Throat:     Mouth: Mucous membranes are moist.     Pharynx: Oropharynx is clear.  Eyes:     General: No scleral icterus.       Right eye: No discharge.        Left eye: No discharge.     Extraocular Movements: Extraocular movements intact.     Conjunctiva/sclera: Conjunctivae normal.     Pupils: Pupils are equal, round, and reactive to light.  Cardiovascular:     Rate and Rhythm: Normal rate and regular rhythm.     Pulses: Normal pulses.     Heart sounds: Normal heart sounds. No murmur heard.    No friction rub. No gallop.  Pulmonary:     Effort:  Pulmonary effort is normal. No respiratory distress.     Breath sounds: Normal breath sounds. No stridor. No wheezing, rhonchi or rales.  Chest:     Chest wall: No tenderness.  Musculoskeletal:        General: Normal range of motion.     Cervical back: Normal range of motion and neck supple.  Skin:    General: Skin is warm and dry.     Capillary Refill: Capillary refill takes less than 2 seconds.     Coloration: Skin is not jaundiced or pale.     Findings: No bruising, erythema, lesion or rash.   Neurological:     General: No focal deficit present.     Mental Status: She is alert and oriented to person, place, and time. Mental status is at baseline.  Psychiatric:        Mood and Affect: Mood normal.        Behavior: Behavior normal.        Thought Content: Thought content normal.        Judgment: Judgment normal.        06/17/2023    8:17 AM 06/15/2022    8:48 AM 06/09/2020    8:17 AM 06/07/2019    8:32 AM 06/01/2018    3:55 PM  6CIT Screen  What Year? 0 points 0 points 0 points 0 points 0 points  What month? 0 points 0 points 0 points 0 points 0 points  What time? 0 points 0 points 0 points 0 points 0 points  Count back from 20 2 points 0 points 0 points 0 points 0 points  Months in reverse 0 points 0 points 0 points 0 points 0 points  Repeat phrase 0 points 0 points 0 points 0 points 0 points  Total Score 2 points 0 points 0 points 0 points 0 points    Results for orders placed or performed in visit on 02/18/23  Basic metabolic panel   Collection Time: 02/18/23  9:41 AM  Result Value Ref Range   Glucose 94 70 - 99 mg/dL   BUN 22 8 - 27 mg/dL   Creatinine, Ser 1.61 (H) 0.57 - 1.00 mg/dL   eGFR 58 (L) >09 UE/AVW/0.98   BUN/Creatinine Ratio 22 12 - 28   Sodium 144 134 - 144 mmol/L   Potassium 4.0 3.5 - 5.2 mmol/L   Chloride 105 96 - 106 mmol/L   CO2 21 20 - 29 mmol/L   Calcium 9.6 8.7 - 10.3 mg/dL      Assessment & Plan:   Problem List Items Addressed This Visit       Cardiovascular and Mediastinum   Hypertension   Under good control on current regimen. Continue current regimen. Continue to monitor. Call with any concerns. Refills given. Labs drawn today.       Relevant Medications   atorvastatin (LIPITOR) 20 MG tablet   hydrochlorothiazide (HYDRODIURIL) 50 MG tablet   Other Relevant Orders   Comprehensive metabolic panel   TSH     Other   Hypercholesteremia   Under good control on current regimen. Continue current regimen. Continue to monitor.  Call with any concerns. Refills given. Labs drawn today.        Relevant Medications   atorvastatin (LIPITOR) 20 MG tablet   hydrochlorothiazide (HYDRODIURIL) 50 MG tablet   Other Relevant Orders   Comprehensive metabolic panel   Lipid Panel w/o Chol/HDL Ratio   Other Visit Diagnoses       Encounter  for Medicare annual wellness exam    -  Primary   Preventative care discussed today as below.     Hot flashes       Will check labs. Continue to monitor- call if not getting better or worsening.   Relevant Medications   atorvastatin (LIPITOR) 20 MG tablet   hydrochlorothiazide (HYDRODIURIL) 50 MG tablet   Other Relevant Orders   CBC with Differential/Platelet   TSH        Preventative Services:  Health Risk Assessment and Personalized Prevention Plan: Done today Bone Mass Measurements: Scheduled Breast Cancer Screening: Scheduled CVD Screening: Done today Cervical Cancer Screening: N/A Colon Cancer Screening: Up to date Depression Screening: Up to date Diabetes Screening: Done today Glaucoma Screening: See your eye doctor Hepatitis B vaccine: N/A Hepatitis C screening: up to date HIV Screening: Up to date Flu Vaccine: Up to date Lung cancer Screening: N/A Obesity Screening: Done today Pneumonia Vaccines (2): Up to date STI Screening: N/A  Follow up plan: Return in about 6 months (around 12/16/2023) for physical.   LABORATORY TESTING:  - Pap smear: not applicable  IMMUNIZATIONS:   - Tdap: Tetanus vaccination status reviewed: last tetanus booster within 10 years. - Influenza: Up to date - Pneumovax: Up to date - Prevnar: Up to date - Zostavax vaccine: Up to date  SCREENING: -Mammogram:  Scheduled   - Colonoscopy: Up to date  - Bone Density: Scheduled   PATIENT COUNSELING:   Advised to take 1 mg of folate supplement per day if capable of pregnancy.   Sexuality: Discussed sexually transmitted diseases, partner selection, use of condoms, avoidance of  unintended pregnancy  and contraceptive alternatives.   Advised to avoid cigarette smoking.  I discussed with the patient that most people either abstain from alcohol or drink within safe limits (<=14/week and <=4 drinks/occasion for males, <=7/weeks and <= 3 drinks/occasion for females) and that the risk for alcohol disorders and other health effects rises proportionally with the number of drinks per week and how often a drinker exceeds daily limits.  Discussed cessation/primary prevention of drug use and availability of treatment for abuse.   Diet: Encouraged to adjust caloric intake to maintain  or achieve ideal body weight, to reduce intake of dietary saturated fat and total fat, to limit sodium intake by avoiding high sodium foods and not adding table salt, and to maintain adequate dietary potassium and calcium preferably from fresh fruits, vegetables, and low-fat dairy products.    stressed the importance of regular exercise  Injury prevention: Discussed safety belts, safety helmets, smoke detector, smoking near bedding or upholstery.   Dental health: Discussed importance of regular tooth brushing, flossing, and dental visits.    NEXT PREVENTATIVE PHYSICAL DUE IN 1 YEAR. Return in about 6 months (around 12/16/2023) for physical.

## 2023-06-18 LAB — CBC WITH DIFFERENTIAL/PLATELET
Basophils Absolute: 0.1 10*3/uL (ref 0.0–0.2)
Basos: 1 %
EOS (ABSOLUTE): 0.1 10*3/uL (ref 0.0–0.4)
Eos: 2 %
Hematocrit: 44.5 % (ref 34.0–46.6)
Hemoglobin: 14.7 g/dL (ref 11.1–15.9)
Immature Grans (Abs): 0 10*3/uL (ref 0.0–0.1)
Immature Granulocytes: 0 %
Lymphocytes Absolute: 1 10*3/uL (ref 0.7–3.1)
Lymphs: 18 %
MCH: 30.4 pg (ref 26.6–33.0)
MCHC: 33 g/dL (ref 31.5–35.7)
MCV: 92 fL (ref 79–97)
Monocytes Absolute: 0.4 10*3/uL (ref 0.1–0.9)
Monocytes: 8 %
Neutrophils Absolute: 4 10*3/uL (ref 1.4–7.0)
Neutrophils: 71 %
Platelets: 267 10*3/uL (ref 150–450)
RBC: 4.83 x10E6/uL (ref 3.77–5.28)
RDW: 12.9 % (ref 11.7–15.4)
WBC: 5.7 10*3/uL (ref 3.4–10.8)

## 2023-06-18 LAB — COMPREHENSIVE METABOLIC PANEL
ALT: 26 [IU]/L (ref 0–32)
AST: 29 [IU]/L (ref 0–40)
Albumin: 4.3 g/dL (ref 3.8–4.8)
Alkaline Phosphatase: 111 [IU]/L (ref 44–121)
BUN/Creatinine Ratio: 17 (ref 12–28)
BUN: 15 mg/dL (ref 8–27)
Bilirubin Total: 1 mg/dL (ref 0.0–1.2)
CO2: 20 mmol/L (ref 20–29)
Calcium: 9.9 mg/dL (ref 8.7–10.3)
Chloride: 104 mmol/L (ref 96–106)
Creatinine, Ser: 0.86 mg/dL (ref 0.57–1.00)
Globulin, Total: 2.3 g/dL (ref 1.5–4.5)
Glucose: 98 mg/dL (ref 70–99)
Potassium: 4.3 mmol/L (ref 3.5–5.2)
Sodium: 142 mmol/L (ref 134–144)
Total Protein: 6.6 g/dL (ref 6.0–8.5)
eGFR: 71 mL/min/{1.73_m2} (ref >59)

## 2023-06-18 LAB — LIPID PANEL W/O CHOL/HDL RATIO
Cholesterol, Total: 138 mg/dL (ref 100–199)
HDL: 54 mg/dL (ref >39)
LDL Chol Calc (NIH): 69 mg/dL (ref 0–99)
Triglycerides: 78 mg/dL (ref 0–149)
VLDL Cholesterol Cal: 15 mg/dL (ref 5–40)

## 2023-06-18 LAB — TSH: TSH: 1.35 u[IU]/mL (ref 0.450–4.500)

## 2023-07-27 ENCOUNTER — Encounter: Payer: Self-pay | Admitting: Family Medicine

## 2023-07-27 ENCOUNTER — Ambulatory Visit (INDEPENDENT_AMBULATORY_CARE_PROVIDER_SITE_OTHER): Payer: PPO | Admitting: Family Medicine

## 2023-07-27 VITALS — BP 129/76 | HR 89 | Wt 249.2 lb

## 2023-07-27 DIAGNOSIS — R052 Subacute cough: Secondary | ICD-10-CM | POA: Diagnosis not present

## 2023-07-27 MED ORDER — PREDNISONE 10 MG PO TABS: ORAL_TABLET | ORAL | 0 refills | Status: DC

## 2023-07-27 MED ORDER — HYDROCOD POLI-CHLORPHE POLI ER 10-8 MG/5ML PO SUER: 5.0000 mL | Freq: Two times a day (BID) | ORAL | 0 refills | Status: DC | PRN

## 2023-07-27 NOTE — Progress Notes (Signed)
BP 129/76   Pulse 89   Wt 249 lb 3.2 oz (113 kg)   LMP  (LMP Unknown)   SpO2 99%   BMI 44.85 kg/m    Subjective:    Patient ID: Heather James, female    DOB: 12-11-1950, 73 y.o.   MRN: [id number]  HPI: Heather James is a 73 y.o. female  Chief Complaint  Patient presents with   Cough    Patient says last Thursday she started coughing and has some congestion down deep into her throat area. Patient says the phlegm in her throat will not break up even after taking Mucinex day and night and then Tussin at night. Patient says she does not feel bad, but says when she goes to swallows it seems as if it doesn't want to go down. Patient says she now hears a raddling and says her cough is now dry. Patient says when she blows her nose or cough up anything its clear.    UPPER RESPIRATORY TRACT INFECTION Duration: 6 days Worst symptom: congestion, cough Fever: no Cough: yes Shortness of breath: yes Wheezing: yes Chest pain: no Chest tightness: no Chest congestion: no Nasal congestion: yes Runny nose: no Post nasal drip: yes Sneezing: no Sore throat: no Swollen glands: no Sinus pressure: no Headache: no Face pain: no Toothache: no Ear pain: no  Ear pressure: no  Eyes red/itching:no Eye drainage/crusting: yes  Vomiting: no Rash: no Fatigue: yes Sick contacts: no Strep contacts: no  Context: better Recurrent sinusitis: no Relief with OTC cold/cough medications: no  Treatments attempted: mucinex    Relevant past medical, surgical, family and social history reviewed and updated as indicated. Interim medical history since our last visit reviewed. Allergies and medications reviewed and updated.  Review of Systems  Constitutional: Negative.   HENT:  Positive for congestion and postnasal drip. Negative for dental problem, drooling, ear discharge, ear pain, facial swelling, hearing loss, mouth sores, nosebleeds, rhinorrhea, sinus pressure, sinus pain, sneezing, sore  throat, tinnitus, trouble swallowing and voice change.   Eyes: Negative.   Respiratory:  Positive for cough, shortness of breath and wheezing. Negative for apnea, choking, chest tightness and stridor.   Cardiovascular: Negative.   Gastrointestinal: Negative.   Psychiatric/Behavioral: Negative.      Per HPI unless specifically indicated above     Objective:    BP 129/76   Pulse 89   Wt 249 lb 3.2 oz (113 kg)   LMP  (LMP Unknown)   SpO2 99%   BMI 44.85 kg/m   Wt Readings from Last 3 Encounters:  07/27/23 249 lb 3.2 oz (113 kg)  06/17/23 243 lb 12.8 oz (110.6 kg)  05/11/23 243 lb (110.2 kg)    Physical Exam Vitals and nursing note reviewed.  Constitutional:      General: She is not in acute distress.    Appearance: Normal appearance. She is not ill-appearing, toxic-appearing or diaphoretic.  HENT:     Head: Normocephalic and atraumatic.     Right Ear: Tympanic membrane, ear canal and external ear normal.     Left Ear: Tympanic membrane, ear canal and external ear normal.     Nose: Rhinorrhea present. No congestion.     Mouth/Throat:     Mouth: Mucous membranes are moist.     Pharynx: Oropharynx is clear. No oropharyngeal exudate or posterior oropharyngeal erythema.  Eyes:     General: No scleral icterus.       Right eye: No discharge.  Left eye: No discharge.     Extraocular Movements: Extraocular movements intact.     Conjunctiva/sclera: Conjunctivae normal.     Pupils: Pupils are equal, round, and reactive to light.  Cardiovascular:     Rate and Rhythm: Normal rate and regular rhythm.     Pulses: Normal pulses.     Heart sounds: Normal heart sounds. No murmur heard.    No friction rub. No gallop.  Pulmonary:     Effort: Pulmonary effort is normal. No respiratory distress.     Breath sounds: Normal breath sounds. No stridor. No wheezing, rhonchi or rales.  Chest:     Chest wall: No tenderness.  Musculoskeletal:        General: Normal range of motion.      Cervical back: Normal range of motion and neck supple.  Skin:    General: Skin is warm and dry.     Capillary Refill: Capillary refill takes less than 2 seconds.     Coloration: Skin is not jaundiced or pale.     Findings: No bruising, erythema, lesion or rash.  Neurological:     General: No focal deficit present.     Mental Status: She is alert and oriented to person, place, and time. Mental status is at baseline.  Psychiatric:        Mood and Affect: Mood normal.        Behavior: Behavior normal.        Thought Content: Thought content normal.        Judgment: Judgment normal.     Results for orders placed or performed in visit on 06/17/23  CBC with Differential/Platelet   Collection Time: 06/17/23  8:22 AM  Result Value Ref Range   WBC 5.7 3.4 - 10.8 x10E3/uL   RBC 4.83 3.77 - 5.28 x10E6/uL   Hemoglobin 14.7 11.1 - 15.9 g/dL   Hematocrit 46.9 62.9 - 46.6 %   MCV 92 79 - 97 fL   MCH 30.4 26.6 - 33.0 pg   MCHC 33.0 31.5 - 35.7 g/dL   RDW 52.8 41.3 - 24.4 %   Platelets 267 150 - 450 x10E3/uL   Neutrophils 71 Not Estab. %   Lymphs 18 Not Estab. %   Monocytes 8 Not Estab. %   Eos 2 Not Estab. %   Basos 1 Not Estab. %   Neutrophils Absolute 4.0 1.4 - 7.0 x10E3/uL   Lymphocytes Absolute 1.0 0.7 - 3.1 x10E3/uL   Monocytes Absolute 0.4 0.1 - 0.9 x10E3/uL   EOS (ABSOLUTE) 0.1 0.0 - 0.4 x10E3/uL   Basophils Absolute 0.1 0.0 - 0.2 x10E3/uL   Immature Granulocytes 0 Not Estab. %   Immature Grans (Abs) 0.0 0.0 - 0.1 x10E3/uL  Comprehensive metabolic panel   Collection Time: 06/17/23  8:22 AM  Result Value Ref Range   Glucose 98 70 - 99 mg/dL   BUN 15 8 - 27 mg/dL   Creatinine, Ser 0.10 0.57 - 1.00 mg/dL   eGFR 71 >27 OZ/DGU/4.40   BUN/Creatinine Ratio 17 12 - 28   Sodium 142 134 - 144 mmol/L   Potassium 4.3 3.5 - 5.2 mmol/L   Chloride 104 96 - 106 mmol/L   CO2 20 20 - 29 mmol/L   Calcium 9.9 8.7 - 10.3 mg/dL   Total Protein 6.6 6.0 - 8.5 g/dL   Albumin 4.3 3.8 - 4.8 g/dL    Globulin, Total 2.3 1.5 - 4.5 g/dL   Bilirubin Total 1.0 0.0 - 1.2 mg/dL  Alkaline Phosphatase 111 44 - 121 IU/L   AST 29 0 - 40 IU/L   ALT 26 0 - 32 IU/L  Lipid Panel w/o Chol/HDL Ratio   Collection Time: 06/17/23  8:22 AM  Result Value Ref Range   Cholesterol, Total 138 100 - 199 mg/dL   Triglycerides 78 0 - 149 mg/dL   HDL 54 >24 mg/dL   VLDL Cholesterol Cal 15 5 - 40 mg/dL   LDL Chol Calc (NIH) 69 0 - 99 mg/dL  TSH   Collection Time: 06/17/23  8:22 AM  Result Value Ref Range   TSH 1.350 0.450 - 4.500 uIU/mL      Assessment & Plan:   Problem List Items Addressed This Visit   None Visit Diagnoses       Subacute cough    -  Primary   Will treat with prednisone and tussionex. Call with any concerns or if not getting better. Continue to monitor.        Follow up plan: Return in about 5 months (around 12/25/2023) for physical.

## 2023-08-02 ENCOUNTER — Ambulatory Visit
Admission: RE | Admit: 2023-08-02 | Discharge: 2023-08-02 | Disposition: A | Discharge status: Home / Self Care | Payer: PPO | Source: Ambulatory Visit | Attending: Family Medicine | Admitting: Family Medicine

## 2023-08-02 DIAGNOSIS — M810 Age-related osteoporosis without current pathological fracture: Secondary | ICD-10-CM | POA: Insufficient documentation

## 2023-08-02 DIAGNOSIS — Z780 Asymptomatic menopausal state: Secondary | ICD-10-CM | POA: Insufficient documentation

## 2023-08-02 DIAGNOSIS — Z13820 Encounter for screening for osteoporosis: Secondary | ICD-10-CM | POA: Insufficient documentation

## 2023-08-02 DIAGNOSIS — Z1231 Encounter for screening mammogram for malignant neoplasm of breast: Secondary | ICD-10-CM | POA: Diagnosis not present

## 2023-08-02 DIAGNOSIS — Z85828 Personal history of other malignant neoplasm of skin: Secondary | ICD-10-CM | POA: Insufficient documentation

## 2023-08-03 ENCOUNTER — Encounter: Payer: Self-pay | Admitting: Family Medicine

## 2023-08-04 ENCOUNTER — Encounter: Payer: Self-pay | Admitting: Family Medicine

## 2023-09-14 DIAGNOSIS — H5203 Hypermetropia, bilateral: Secondary | ICD-10-CM | POA: Diagnosis not present

## 2023-09-14 DIAGNOSIS — H2513 Age-related nuclear cataract, bilateral: Secondary | ICD-10-CM | POA: Diagnosis not present

## 2023-09-14 DIAGNOSIS — H52223 Regular astigmatism, bilateral: Secondary | ICD-10-CM | POA: Diagnosis not present

## 2023-09-14 DIAGNOSIS — G510 Bell's palsy: Secondary | ICD-10-CM | POA: Diagnosis not present

## 2023-09-14 DIAGNOSIS — H524 Presbyopia: Secondary | ICD-10-CM | POA: Diagnosis not present

## 2023-12-21 ENCOUNTER — Telehealth: Payer: Self-pay | Admitting: Family Medicine

## 2023-12-21 NOTE — Telephone Encounter (Signed)
 Patient needs Lipotor 20 mg refilled appt has been rescheduled.

## 2023-12-22 ENCOUNTER — Encounter: Payer: Self-pay | Admitting: Family Medicine

## 2023-12-22 MED ORDER — ATORVASTATIN CALCIUM 20 MG PO TABS: ORAL_TABLET | ORAL | 1 refills | Status: DC

## 2023-12-22 NOTE — Telephone Encounter (Signed)
 Refill sent to the pharmacy

## 2024-01-09 DIAGNOSIS — H168 Other keratitis: Secondary | ICD-10-CM | POA: Diagnosis not present

## 2024-01-09 DIAGNOSIS — H04123 Dry eye syndrome of bilateral lacrimal glands: Secondary | ICD-10-CM | POA: Diagnosis not present

## 2024-01-09 DIAGNOSIS — H2513 Age-related nuclear cataract, bilateral: Secondary | ICD-10-CM | POA: Diagnosis not present

## 2024-01-09 DIAGNOSIS — G510 Bell's palsy: Secondary | ICD-10-CM | POA: Diagnosis not present

## 2024-01-17 DIAGNOSIS — H2511 Age-related nuclear cataract, right eye: Secondary | ICD-10-CM | POA: Diagnosis not present

## 2024-01-17 DIAGNOSIS — H2513 Age-related nuclear cataract, bilateral: Secondary | ICD-10-CM | POA: Diagnosis not present

## 2024-01-24 DIAGNOSIS — D2272 Melanocytic nevi of left lower limb, including hip: Secondary | ICD-10-CM | POA: Diagnosis not present

## 2024-01-24 DIAGNOSIS — D2271 Melanocytic nevi of right lower limb, including hip: Secondary | ICD-10-CM | POA: Diagnosis not present

## 2024-01-24 DIAGNOSIS — L570 Actinic keratosis: Secondary | ICD-10-CM | POA: Diagnosis not present

## 2024-01-24 DIAGNOSIS — D2261 Melanocytic nevi of right upper limb, including shoulder: Secondary | ICD-10-CM | POA: Diagnosis not present

## 2024-01-24 DIAGNOSIS — Z85828 Personal history of other malignant neoplasm of skin: Secondary | ICD-10-CM | POA: Diagnosis not present

## 2024-01-24 DIAGNOSIS — D2262 Melanocytic nevi of left upper limb, including shoulder: Secondary | ICD-10-CM | POA: Diagnosis not present

## 2024-02-01 ENCOUNTER — Encounter: Payer: Self-pay | Admitting: Ophthalmology

## 2024-02-02 NOTE — Anesthesia Preprocedure Evaluation (Addendum)
 Anesthesia Evaluation  Patient identified by MRN, date of birth, ID band Patient awake    Reviewed: Allergy & Precautions, H&P , NPO status , Patient's Chart, lab work & pertinent test results  Airway Mallampati: III  TM Distance: >3 FB Neck ROM: Full    Dental no notable dental hx. (+) Caps Multiple caps, crowns in molar regions, none in central teeth:   Pulmonary neg pulmonary ROS   Pulmonary exam normal breath sounds clear to auscultation       Cardiovascular hypertension, negative cardio ROS Normal cardiovascular exam Rhythm:Regular Rate:Normal     Neuro/Psych negative neurological ROS  negative psych ROS   GI/Hepatic negative GI ROS, Neg liver ROS,,,  Endo/Other  negative endocrine ROS    Renal/GU negative Renal ROS  negative genitourinary   Musculoskeletal negative musculoskeletal ROS (+)    Abdominal   Peds negative pediatric ROS (+)  Hematology negative hematology ROS (+)   Anesthesia Other Findings CSF leak from ear  Osteopenia Obesity  Hypertension Hypercholesteremia  Cancer (HCC) Advanced care planning/counseling discussion  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 side of 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   Reproductive/Obstetrics negative OB ROS                              Anesthesia Physical Anesthesia Plan  ASA: 2  Anesthesia Plan: MAC   Post-op Pain Management:    Induction: Intravenous  PONV Risk Score and Plan:   Airway Management Planned: Natural Airway and Nasal Cannula  Additional Equipment:   Intra-op Plan:   Post-operative Plan:   Informed Consent: I have reviewed the patients History and Physical, chart, labs and discussed the procedure including the risks, benefits and alternatives for the proposed anesthesia with the patient or authorized representative who has indicated his/her understanding and acceptance.     Dental Advisory Given  Plan  Discussed with: Anesthesiologist, CRNA and Surgeon  Anesthesia Plan Comments: (Patient consented for risks of anesthesia including but not limited to:  - adverse reactions to medications - damage to eyes, teeth, lips or other oral mucosa - nerve damage due to positioning  - sore throat or hoarseness - Damage to heart, brain, nerves, lungs, other parts of body or loss of life  Patient voiced understanding and assent.)         Anesthesia Quick Evaluation

## 2024-02-14 NOTE — Discharge Instructions (Signed)

## 2024-02-15 ENCOUNTER — Ambulatory Visit
Admission: RE | Admit: 2024-02-15 | Discharge: 2024-02-15 | Disposition: A | Discharge status: Home / Self Care | Attending: Ophthalmology | Admitting: Ophthalmology

## 2024-02-15 ENCOUNTER — Encounter
Admission: RE | Disposition: A | Discharge status: Home / Self Care | Payer: Self-pay | Source: Home / Self Care | Attending: Ophthalmology

## 2024-02-15 ENCOUNTER — Encounter: Payer: Self-pay | Admitting: Ophthalmology

## 2024-02-15 ENCOUNTER — Ambulatory Visit: Payer: Self-pay | Admitting: Anesthesiology

## 2024-02-15 ENCOUNTER — Other Ambulatory Visit: Payer: Self-pay

## 2024-02-15 DIAGNOSIS — H2512 Age-related nuclear cataract, left eye: Secondary | ICD-10-CM | POA: Diagnosis not present

## 2024-02-15 DIAGNOSIS — E7800 Pure hypercholesterolemia, unspecified: Secondary | ICD-10-CM | POA: Diagnosis not present

## 2024-02-15 DIAGNOSIS — E669 Obesity, unspecified: Secondary | ICD-10-CM | POA: Diagnosis not present

## 2024-02-15 DIAGNOSIS — I10 Essential (primary) hypertension: Secondary | ICD-10-CM | POA: Insufficient documentation

## 2024-02-15 DIAGNOSIS — H2511 Age-related nuclear cataract, right eye: Secondary | ICD-10-CM | POA: Diagnosis not present

## 2024-02-15 DIAGNOSIS — Z6841 Body Mass Index (BMI) 40.0 and over, adult: Secondary | ICD-10-CM | POA: Diagnosis not present

## 2024-02-15 HISTORY — DX: Other acquired deformity of head: M95.2

## 2024-02-15 HISTORY — PX: CATARACT EXTRACTION W/PHACO: SHX586

## 2024-02-15 SURGERY — PHACOEMULSIFICATION, CATARACT, WITH IOL INSERTION
Anesthesia: Monitor Anesthesia Care | Site: Eye | Laterality: Right

## 2024-02-15 MED ORDER — TETRACAINE HCL 0.5 % OP SOLN
OPHTHALMIC | Status: AC
  Filled 2024-02-15: qty 4

## 2024-02-15 MED ORDER — SIGHTPATH DOSE#1 BSS IO SOLN
INTRAOCULAR | Status: DC | PRN
  Administered 2024-02-15: 15 mL via INTRAOCULAR

## 2024-02-15 MED ORDER — ARMC OPHTHALMIC DILATING DROPS
1.0000 | OPHTHALMIC | Status: DC | PRN
Start: 2024-02-15 — End: 2024-02-15
  Administered 2024-02-15 (×3): 1 via OPHTHALMIC

## 2024-02-15 MED ORDER — MIDAZOLAM HCL 2 MG/2ML IJ SOLN
INTRAMUSCULAR | Status: AC
Start: 2024-02-15 — End: 2024-02-15
  Filled 2024-02-15: qty 2

## 2024-02-15 MED ORDER — SIGHTPATH DOSE#1 NA HYALUR & NA CHOND-NA HYALUR IO KIT
PACK | INTRAOCULAR | Status: DC | PRN
Start: 2024-02-15 — End: 2024-02-15
  Administered 2024-02-15: 1 via OPHTHALMIC

## 2024-02-15 MED ORDER — CEFUROXIME OPHTHALMIC INJECTION 1 MG/0.1 ML
INJECTION | OPHTHALMIC | Status: DC | PRN
  Administered 2024-02-15: 1 mg via OPHTHALMIC

## 2024-02-15 MED ORDER — LIDOCAINE HCL (PF) 2 % IJ SOLN
INTRAMUSCULAR | Status: DC | PRN
  Administered 2024-02-15: 2 mL

## 2024-02-15 MED ORDER — TETRACAINE HCL 0.5 % OP SOLN
1.0000 [drp] | OPHTHALMIC | Status: DC | PRN
Start: 2024-02-15 — End: 2024-02-15
  Administered 2024-02-15 (×3): 1 [drp] via OPHTHALMIC

## 2024-02-15 MED ORDER — FENTANYL CITRATE (PF) 100 MCG/2ML IJ SOLN
INTRAMUSCULAR | Status: AC
  Filled 2024-02-15: qty 2

## 2024-02-15 MED ORDER — SIGHTPATH DOSE#1 BSS IO SOLN
INTRAOCULAR | Status: DC | PRN
  Administered 2024-02-15: 41 mL via OPHTHALMIC

## 2024-02-15 MED ORDER — BRIMONIDINE TARTRATE-TIMOLOL 0.2-0.5 % OP SOLN
OPHTHALMIC | Status: DC | PRN
  Administered 2024-02-15: 1 [drp] via OPHTHALMIC

## 2024-02-15 MED ORDER — LACTATED RINGERS IV SOLN: INTRAVENOUS | Status: DC

## 2024-02-15 MED ORDER — MIDAZOLAM HCL 2 MG/2ML IJ SOLN
INTRAMUSCULAR | Status: DC | PRN
  Administered 2024-02-15 (×2): 1 mg via INTRAVENOUS

## 2024-02-15 MED ORDER — NEOMYCIN-POLYMYXIN-DEXAMETH 3.5-10000-0.1 OP OINT
TOPICAL_OINTMENT | OPHTHALMIC | Status: DC | PRN
Start: 2024-02-15 — End: 2024-02-15
  Administered 2024-02-15: 1 via OPHTHALMIC

## 2024-02-15 MED ORDER — ARMC OPHTHALMIC DILATING DROPS
OPHTHALMIC | Status: AC
  Filled 2024-02-15: qty 0.5

## 2024-02-15 MED ORDER — FENTANYL CITRATE (PF) 100 MCG/2ML IJ SOLN
INTRAMUSCULAR | Status: DC | PRN
  Administered 2024-02-15: 50 ug via INTRAVENOUS

## 2024-02-15 MED ORDER — BSS IO SOLN
INTRAOCULAR | Status: DC | PRN
  Administered 2024-02-15: 15 mL via INTRAOCULAR

## 2024-02-15 SURGICAL SUPPLY — 8 items
FEE CATARACT SUITE SIGHTPATH (MISCELLANEOUS) ×1 IMPLANT
GLOVE BIOGEL PI IND STRL 8 (GLOVE) ×1 IMPLANT
GLOVE SURG LX STRL 7.5 STRW (GLOVE) ×1 IMPLANT
GLOVE SURG SYN 6.5 PF PI BL (GLOVE) ×1 IMPLANT
LENS IOL TECNIS EYHANCE 24.5 (Intraocular Lens) IMPLANT
NDL FILTER BLUNT 18X1 1/2 (NEEDLE) ×1 IMPLANT
NEEDLE FILTER BLUNT 18X1 1/2 (NEEDLE) ×1 IMPLANT
SYR 3ML LL SCALE MARK (SYRINGE) ×1 IMPLANT

## 2024-02-15 NOTE — Op Note (Signed)
 LOCATION:  [facility]   PREOPERATIVE DIAGNOSIS:    Nuclear sclerotic cataract right eye. H25.11   POSTOPERATIVE DIAGNOSIS:  Nuclear sclerotic cataract right eye.     PROCEDURE:  Phacoemusification with posterior chamber intraocular lens placement of the right eye   ULTRASOUND TIME: Procedure(s): PHACOEMULSIFICATION, CATARACT, WITH IOL INSERTION 6.46 00:31.5 (Right)  LENS:   Implant Name Type Inv. Item Serial No. Manufacturer Lot No. LRB No. Used Action  LENS IOL TECNIS EYHANCE 24.5 - [id number] Intraocular Lens LENS IOL TECNIS EYHANCE 24.5 [id number] [employer]  Right 1 Implanted         SURGEON:  Dene FABIENE Etienne, MD   ANESTHESIA:  Topical with tetracaine  drops and 2% Xylocaine  jelly, augmented with 1% preservative-free intracameral lidocaine .    COMPLICATIONS:  None.   DESCRIPTION OF PROCEDURE:  The patient was identified in the holding room and transported to the operating room and placed in the supine position under the operating microscope.  The right eye was identified as the operative eye and it was prepped and draped in the usual sterile ophthalmic fashion.   A 1 millimeter clear-corneal paracentesis was made at the 12:00 position.  0.5 ml of preservative-free 1% lidocaine  was injected into the anterior chamber. The anterior chamber was filled with Viscoat viscoelastic.  A 2.4 millimeter keratome was used to make a near-clear corneal incision at the 9:00 position.  A curvilinear capsulorrhexis was made with a cystotome and capsulorrhexis forceps.  Balanced salt solution was used to hydrodissect and hydrodelineate the nucleus.   Phacoemulsification was then used in stop and chop fashion to remove the lens nucleus and epinucleus.  The remaining cortex was then removed using the irrigation and aspiration handpiece. Provisc was then placed into the capsular bag to distend it for lens placement.  A lens was then injected into the capsular bag.  The remaining  viscoelastic was aspirated.   Wounds were hydrated with balanced salt solution.  The anterior chamber was inflated to a physiologic pressure with balanced salt solution.  No wound leaks were noted. Cefuroxime  0.1 ml of a 10mg /ml solution was injected into the anterior chamber for a dose of 1 mg of intracameral antibiotic at the completion of the case.   Timolol  and Brimonidine  drops were applied to the eye.  The patient was taken to the recovery room in stable condition without complications of anesthesia or surgery.   Heather James 02/15/2024, 12:26 PM

## 2024-02-15 NOTE — H&P (Signed)
 [facility]   Primary Care Physician:  Vicci Duwaine SQUIBB, DO Ophthalmologist: Dr. Dene Etienne  Pre-Procedure History & Physical: HPI:  Mairim Bade is a 73 y.o. female here for ophthalmic surgery.   Past Medical History:  Diagnosis Date   Advanced care planning/counseling discussion 05/30/2017   Cancer (HCC)    skin ca on leg   CSF leak from ear    Hypercholesteremia    Hypertension    Obesity    Osteopenia     Past Surgical History:  Procedure Laterality Date   BREAST BIOPSY Left 11/21/1996   neg   BREAST BIOPSY Right 08/18/2021   affirm bx 2 area , x marker, path pending   BREAST SURGERY Left    cyst removed   COLONOSCOPY WITH PROPOFOL  N/A 03/14/2018   Procedure: COLONOSCOPY WITH PROPOFOL ;  Surgeon: Toledo, Ladell POUR, MD;  Location: [facility];  Service: Gastroenterology;  Laterality: N/A;   COLONOSCOPY WITH PROPOFOL  N/A 05/11/2023   Procedure: COLONOSCOPY WITH PROPOFOL ;  Surgeon: Toledo, Ladell POUR, MD;  Location: [facility];  Service: Gastroenterology;  Laterality: N/A;   FACIAL NERVE SURGERY     PILONIDAL CYST EXCISION     SKIN CANCER EXCISION Right    right leg, April 2017   TRIGGER FINGER RELEASE Right     Prior to Admission medications   Medication Sig Start Date End Date Taking? Authorizing Provider  albuterol  (VENTOLIN  HFA) 108 (90 Base) MCG/ACT inhaler Inhale 2 puffs into the lungs every 6 (six) hours as needed for wheezing or shortness of breath. 06/17/23  Yes Johnson, Megan P, DO  aspirin 81 MG tablet Take 81 mg by mouth daily.   Yes [provider]  atorvastatin  (LIPITOR) 20 MG tablet TAKE 1 TABLET(20 MG) BY MOUTH DAILY Strength: 20 mg 12/22/23  Yes Melvin Pao, NP  clobetasol  ointment (TEMOVATE ) 0.05 % Apply 1 Application topically 2 (two) times daily. 11/05/22  Yes Johnson, Megan P, DO  diclofenac  (VOLTAREN ) 75 MG EC tablet Take 1 tablet (75 mg total) by mouth 2 (two) times daily. 02/23/23  Yes Regal, Pasco RAMAN, DPM   diclofenac  sodium (VOLTAREN ) 1 % GEL Apply 4 g topically 4 (four) times daily. 09/05/18  Yes Stuart Vernell Norris, PA-C  hydrochlorothiazide  (HYDRODIURIL ) 50 MG tablet Take 1 tablet (50 mg total) by mouth every other day. 06/17/23  Yes Johnson, Megan P, DO  hydrOXYzine  (VISTARIL ) 25 MG capsule Take 1 capsule (25 mg total) by mouth every 8 (eight) hours as needed. 06/17/23  Yes Johnson, Megan P, DO  meloxicam  (MOBIC ) 7.5 MG tablet Take 1 tablet (7.5 mg total) by mouth daily as needed. 06/17/23  Yes Johnson, Megan P, DO  mometasone  (NASONEX ) 50 MCG/ACT nasal spray Place 2 sprays into the nose daily. 06/17/23  Yes Johnson, Megan P, DO  montelukast  (SINGULAIR ) 10 MG tablet Take 1 tablet (10 mg total) by mouth at bedtime. 06/17/23  Yes Johnson, Megan P, DO  Multiple Vitamins-Minerals (ESTROVEN MENOPAUSE SUPPLEMENT PO) Take by mouth at bedtime.   Yes [provider]  raloxifene  (EVISTA ) 60 MG tablet Take 1 tablet (60 mg total) by mouth daily. 06/17/23  Yes Johnson, Megan P, DO    Allergies as of 01/17/2024   (No Known Allergies)    Family History  Problem Relation Age of Onset   Heart disease Mother    Diabetes Mother    Hypertension Mother    Breast cancer Mother 57   Cancer Mother    Heart disease Father  Diabetes Father    Heart attack Brother 17       CABG x 3   Heart disease Brother    Hypertension Brother    Cancer Maternal Grandmother        spine   Heart disease Maternal Grandfather    Hearing loss Maternal Grandfather    Heart disease Paternal Grandmother    Emphysema Paternal Grandfather     Social History   Socioeconomic History   Marital status: Married    Spouse name: Not on file   Number of children: Not on file   Years of education: 12   Highest education level: 12th grade  Occupational History   Occupation: retired  Tobacco Use   Smoking status: Never   Smokeless tobacco: Never  Vaping Use   Vaping status: Never Used  Substance and Sexual  Activity   Alcohol use: Not Currently   Drug use: No   Sexual activity: Not Currently    Birth control/protection: None  Other Topics Concern   Not on file  Social History Narrative   Not on file   Social Drivers of Health   Financial Resource Strain: Low Risk  (12/15/2023)   Overall Financial Resource Strain (CARDIA)    Difficulty of Paying Living Expenses: Not hard at all  Food Insecurity: No Food Insecurity (12/15/2023)   Hunger Vital Sign    Worried About Running Out of Food in the Last Year: Never true    Ran Out of Food in the Last Year: Never true  Transportation Needs: No Transportation Needs (12/15/2023)   PRAPARE - Administrator, Civil Service (Medical): No    Lack of Transportation (Non-Medical): No  Physical Activity: Sufficiently Active (12/15/2023)   Exercise Vital Sign    Days of Exercise per Week: 2 days    Minutes of Exercise per Session: 120 min  Stress: No Stress Concern Present (12/15/2023)   [employer] of Occupational Health - Occupational Stress Questionnaire    Feeling of Stress: Not at all  Social Connections: Socially Integrated (12/15/2023)   Social Connection and Isolation Panel    Frequency of Communication with Friends and Family: Three times a week    Frequency of Social Gatherings with Friends and Family: Twice a week    Attends Religious Services: More than 4 times per year    Active Member of [employer] or Organizations: Yes    Attends Banker Meetings: 1 to 4 times per year    Marital Status: Married  Catering manager Violence: Not At Risk (06/11/2021)   Humiliation, Afraid, Rape, and Kick questionnaire    Fear of Current or Ex-Partner: No    Emotionally Abused: No    Physically Abused: No    Sexually Abused: No    Review of Systems: See HPI, otherwise negative ROS  Physical Exam: BP (!) 157/71   Pulse 80   Temp (!) 97.5 F (36.4 C) (Temporal)   Resp 19   Ht 5' 2.5 (1.588 m)   Wt 108.7 kg   LMP  (LMP  Unknown)   SpO2 99%   BMI 43.14 kg/m  General:   Alert,  pleasant and cooperative in NAD Head:  Normocephalic and atraumatic. Lungs:  Clear to auscultation.    Heart:  Regular rate and rhythm.   Impression/Plan: Heather James is here for ophthalmic surgery.  Risks, benefits, limitations, and alternatives regarding ophthalmic surgery have been reviewed with the patient.  Questions have been answered.  All parties  agreeable.   MITTIE GASKIN, MD  02/15/2024, 10:50 AM

## 2024-02-15 NOTE — Transfer of Care (Signed)
 Immediate Anesthesia Transfer of Care Note  Patient: Heather James  Procedure(s) Performed: PHACOEMULSIFICATION, CATARACT, WITH IOL INSERTION 6.46 00:31.5 (Right: Eye)  Patient Location: PACU  Anesthesia Type: MAC  Level of Consciousness: awake, alert  and patient cooperative  Airway and Oxygen Therapy: Patient Spontanous Breathing and Patient connected to supplemental oxygen  Post-op Assessment: Post-op Vital signs reviewed, Patient's Cardiovascular Status Stable, Respiratory Function Stable, Patent Airway and No signs of Nausea or vomiting  Post-op Vital Signs: Reviewed and stable  Complications: No notable events documented.

## 2024-02-15 NOTE — Anesthesia Postprocedure Evaluation (Signed)
 Anesthesia Post Note  Patient: Heather James  Procedure(s) Performed: PHACOEMULSIFICATION, CATARACT, WITH IOL INSERTION 6.46 00:31.5 (Right: Eye)  Patient location during evaluation: PACU Anesthesia Type: MAC Level of consciousness: awake and alert Pain management: pain level controlled Vital Signs Assessment: post-procedure vital signs reviewed and stable Respiratory status: spontaneous breathing, nonlabored ventilation, respiratory function stable and patient connected to nasal cannula oxygen Cardiovascular status: stable and blood pressure returned to baseline Postop Assessment: no apparent nausea or vomiting Anesthetic complications: no   No notable events documented.   Last Vitals:  Vitals:   02/15/24 1226 02/15/24 1230  BP: 133/61 101/83  Pulse: 76 81  Resp: 19 17  Temp: 36.5 C 36.5 C  SpO2: 96% 96%    Last Pain:  Vitals:   02/15/24 1230  TempSrc:   PainSc: 0-No pain                 Reign Dziuba C Isabell Bonafede

## 2024-02-16 ENCOUNTER — Encounter: Payer: Self-pay | Admitting: Ophthalmology

## 2024-02-16 ENCOUNTER — Other Ambulatory Visit: Payer: Self-pay

## 2024-02-16 DIAGNOSIS — H2512 Age-related nuclear cataract, left eye: Secondary | ICD-10-CM | POA: Diagnosis not present

## 2024-02-23 NOTE — Discharge Instructions (Signed)

## 2024-02-29 ENCOUNTER — Ambulatory Visit: Payer: Self-pay | Admitting: Anesthesiology

## 2024-02-29 ENCOUNTER — Encounter
Admission: RE | Disposition: A | Discharge status: Home / Self Care | Payer: Self-pay | Source: Home / Self Care | Attending: Ophthalmology

## 2024-02-29 ENCOUNTER — Ambulatory Visit
Admission: RE | Admit: 2024-02-29 | Discharge: 2024-02-29 | Disposition: A | Discharge status: Home / Self Care | Attending: Ophthalmology | Admitting: Ophthalmology

## 2024-02-29 ENCOUNTER — Encounter: Payer: Self-pay | Admitting: Ophthalmology

## 2024-02-29 ENCOUNTER — Other Ambulatory Visit: Payer: Self-pay

## 2024-02-29 DIAGNOSIS — H2512 Age-related nuclear cataract, left eye: Secondary | ICD-10-CM | POA: Insufficient documentation

## 2024-02-29 DIAGNOSIS — I10 Essential (primary) hypertension: Secondary | ICD-10-CM | POA: Insufficient documentation

## 2024-02-29 HISTORY — PX: CATARACT EXTRACTION W/PHACO: SHX586

## 2024-02-29 SURGERY — PHACOEMULSIFICATION, CATARACT, WITH IOL INSERTION
Anesthesia: Monitor Anesthesia Care | Laterality: Left

## 2024-02-29 MED ORDER — DEXMEDETOMIDINE HCL IN NACL 80 MCG/20ML IV SOLN
INTRAVENOUS | Status: AC
  Filled 2024-02-29: qty 20

## 2024-02-29 MED ORDER — FENTANYL CITRATE (PF) 100 MCG/2ML IJ SOLN
INTRAMUSCULAR | Status: AC
  Filled 2024-02-29: qty 2

## 2024-02-29 MED ORDER — MIDAZOLAM HCL 2 MG/2ML IJ SOLN
INTRAMUSCULAR | Status: AC
  Filled 2024-02-29: qty 2

## 2024-02-29 MED ORDER — LACTATED RINGERS IV SOLN: INTRAVENOUS | Status: DC

## 2024-02-29 MED ORDER — BRIMONIDINE TARTRATE-TIMOLOL 0.2-0.5 % OP SOLN
OPHTHALMIC | Status: DC | PRN
  Administered 2024-02-29: 1 [drp] via OPHTHALMIC

## 2024-02-29 MED ORDER — SIGHTPATH DOSE#1 NA HYALUR & NA CHOND-NA HYALUR IO KIT
PACK | INTRAOCULAR | Status: DC | PRN
  Administered 2024-02-29: 1 via OPHTHALMIC

## 2024-02-29 MED ORDER — ARMC OPHTHALMIC DILATING DROPS
OPHTHALMIC | Status: AC
  Filled 2024-02-29: qty 0.5

## 2024-02-29 MED ORDER — LIDOCAINE HCL (PF) 2 % IJ SOLN
INTRAOCULAR | Status: DC | PRN
  Administered 2024-02-29: 1 mL

## 2024-02-29 MED ORDER — ARMC OPHTHALMIC DILATING DROPS
1.0000 | OPHTHALMIC | Status: DC | PRN
  Administered 2024-02-29 (×2): 1 via OPHTHALMIC

## 2024-02-29 MED ORDER — SIGHTPATH DOSE#1 BSS IO SOLN
INTRAOCULAR | Status: DC | PRN
  Administered 2024-02-29: 66 mL via OPHTHALMIC

## 2024-02-29 MED ORDER — MIDAZOLAM HCL 2 MG/2ML IJ SOLN
INTRAMUSCULAR | Status: DC | PRN
Start: 2024-02-29 — End: 2024-02-29
  Administered 2024-02-29 (×2): 1 mg via INTRAVENOUS

## 2024-02-29 MED ORDER — TETRACAINE HCL 0.5 % OP SOLN
OPHTHALMIC | Status: AC
  Filled 2024-02-29: qty 4

## 2024-02-29 MED ORDER — FENTANYL CITRATE (PF) 100 MCG/2ML IJ SOLN
INTRAMUSCULAR | Status: DC | PRN
  Administered 2024-02-29 (×2): 50 ug via INTRAVENOUS

## 2024-02-29 MED ORDER — TETRACAINE HCL 0.5 % OP SOLN
1.0000 [drp] | OPHTHALMIC | Status: DC | PRN
Start: 2024-02-29 — End: 2024-02-29
  Administered 2024-02-29 (×3): 1 [drp] via OPHTHALMIC

## 2024-02-29 MED ORDER — CEFUROXIME OPHTHALMIC INJECTION 1 MG/0.1 ML
INJECTION | OPHTHALMIC | Status: DC | PRN
  Administered 2024-02-29: .1 mL via INTRACAMERAL

## 2024-02-29 MED ORDER — SIGHTPATH DOSE#1 BSS IO SOLN
INTRAOCULAR | Status: DC | PRN
  Administered 2024-02-29: 15 mL via INTRAOCULAR

## 2024-02-29 SURGICAL SUPPLY — 16 items
BNDG EYE OVAL 2 1/8 X 2 5/8 (GAUZE/BANDAGES/DRESSINGS) IMPLANT
CANNULA ANT/CHMB 27G (MISCELLANEOUS) IMPLANT
FEE CATARACT SUITE SIGHTPATH (MISCELLANEOUS) ×1 IMPLANT
GLOVE BIOGEL PI IND STRL 8 (GLOVE) ×1 IMPLANT
GLOVE PI ULTRA LF STRL 7.5 (GLOVE) IMPLANT
GLOVE SURG LX STRL 7.5 STRW (GLOVE) ×1 IMPLANT
GLOVE SURG SYN 6.5 PF PI BL (GLOVE) ×1 IMPLANT
LENS IOL TECNIS EYHANCE 23.5 (Intraocular Lens) IMPLANT
NDL FILTER BLUNT 18X1 1/2 (NEEDLE) ×1 IMPLANT
NDL RETROBULBAR .5 NSTRL (NEEDLE) IMPLANT
NEEDLE FILTER BLUNT 18X1 1/2 (NEEDLE) ×1 IMPLANT
PACK VIT ANT 23G (MISCELLANEOUS) IMPLANT
RING MALYGIN 7.0 (MISCELLANEOUS) IMPLANT
SUT VICRYL 9 0 (SUTURE) IMPLANT
SUTURE EHLN 10-0 CS-B-6CS-B-6 (SUTURE) IMPLANT
SYR 3ML LL SCALE MARK (SYRINGE) ×1 IMPLANT

## 2024-02-29 NOTE — Anesthesia Preprocedure Evaluation (Addendum)
 Anesthesia Evaluation  Patient identified by MRN, date of birth, ID band Patient awake    Reviewed: Allergy & Precautions, H&P , NPO status , Patient's Chart, lab work & pertinent test results  Airway Mallampati: III  TM Distance: >3 FB Neck ROM: Full    Dental no notable dental hx. (+) Caps Multiple caps, crowns in molar regions, none in central teeth::   Pulmonary neg pulmonary ROS   Pulmonary exam normal breath sounds clear to auscultation       Cardiovascular hypertension, negative cardio ROS Normal cardiovascular exam Rhythm:Regular Rate:Normal     Neuro/Psych negative neurological ROS  negative psych ROS   GI/Hepatic negative GI ROS, Neg liver ROS,,,  Endo/Other  negative endocrine ROS    Renal/GU negative Renal ROS  negative genitourinary   Musculoskeletal negative musculoskeletal ROS (+)    Abdominal   Peds negative pediatric ROS (+)  Hematology negative hematology ROS (+)   Anesthesia Other Findings Previous cataract surgery 02-15-24 Dr. Ola    CSF leak from ear   Osteopenia Obesity             Hypertension Hypercholesteremia             Cancer (HCC) Advanced care planning/counseling discussion  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 side of 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     Reproductive/Obstetrics negative OB ROS                              Anesthesia Physical Anesthesia Plan  ASA: 2  Anesthesia Plan: MAC   Post-op Pain Management:    Induction: Intravenous  PONV Risk Score and Plan:   Airway Management Planned: Natural Airway and Nasal Cannula  Additional Equipment:   Intra-op Plan:   Post-operative Plan:   Informed Consent: I have reviewed the patients History and Physical, chart, labs and discussed the procedure including the risks, benefits and alternatives for the proposed anesthesia with the patient or authorized representative who has indicated  his/her understanding and acceptance.     Dental Advisory Given  Plan Discussed with: Anesthesiologist, CRNA and Surgeon  Anesthesia Plan Comments: (Patient consented for risks of anesthesia including but not limited to:  - adverse reactions to medications - damage to eyes, teeth, lips or other oral mucosa - nerve damage due to positioning  - sore throat or hoarseness - Damage to heart, brain, nerves, lungs, other parts of body or loss of life  Patient voiced understanding and assent.)         Anesthesia Quick Evaluation

## 2024-02-29 NOTE — H&P (Signed)
 [facility]   Primary Care Physician:  Vicci Duwaine SQUIBB, DO Ophthalmologist: Dr. Dene Etienne  Pre-Procedure History & Physical: HPI:  Heather James is a 73 y.o. female here for ophthalmic surgery.   Past Medical History:  Diagnosis Date   Advanced care planning/counseling discussion 05/30/2017   Cancer (HCC)    skin ca on leg   CSF leak from ear    Facial asymmetry, acquired    Hypercholesteremia    Hypertension    Obesity    Osteopenia     Past Surgical History:  Procedure Laterality Date   BREAST BIOPSY Left 11/21/1996   neg   BREAST BIOPSY Right 08/18/2021   affirm bx 2 area , x marker, path pending   BREAST SURGERY Left    cyst removed   CATARACT EXTRACTION W/PHACO Right 02/15/2024   Procedure: PHACOEMULSIFICATION, CATARACT, WITH IOL INSERTION 6.46 00:31.5;  Surgeon: Etienne Dene, MD;  Location: [facility];  Service: Ophthalmology;  Laterality: Right;   COLONOSCOPY WITH PROPOFOL  N/A 03/14/2018   Procedure: COLONOSCOPY WITH PROPOFOL ;  Surgeon: Toledo, Ladell POUR, MD;  Location: [facility];  Service: Gastroenterology;  Laterality: N/A;   COLONOSCOPY WITH PROPOFOL  N/A 05/11/2023   Procedure: COLONOSCOPY WITH PROPOFOL ;  Surgeon: Toledo, Ladell POUR, MD;  Location: [facility];  Service: Gastroenterology;  Laterality: N/A;   FACIAL NERVE SURGERY     PILONIDAL CYST EXCISION     SKIN CANCER EXCISION Right    right leg, April 2017   TRIGGER FINGER RELEASE Right     Prior to Admission medications   Medication Sig Start Date End Date Taking? Authorizing Provider  albuterol  (VENTOLIN  HFA) 108 (90 Base) MCG/ACT inhaler Inhale 2 puffs into the lungs every 6 (six) hours as needed for wheezing or shortness of breath. 06/17/23  Yes Johnson, Megan P, DO  aspirin 81 MG tablet Take 81 mg by mouth daily.   Yes [provider]  atorvastatin  (LIPITOR) 20 MG tablet TAKE 1 TABLET(20 MG) BY MOUTH DAILY Strength: 20 mg 12/22/23  Yes  Melvin Pao, NP  hydrochlorothiazide  (HYDRODIURIL ) 50 MG tablet Take 1 tablet (50 mg total) by mouth every other day. 06/17/23  Yes Johnson, Megan P, DO  hydrOXYzine  (VISTARIL ) 25 MG capsule Take 1 capsule (25 mg total) by mouth every 8 (eight) hours as needed. 06/17/23  Yes Johnson, Megan P, DO  mometasone  (NASONEX ) 50 MCG/ACT nasal spray Place 2 sprays into the nose daily. 06/17/23  Yes Johnson, Megan P, DO  montelukast  (SINGULAIR ) 10 MG tablet Take 1 tablet (10 mg total) by mouth at bedtime. 06/17/23  Yes Johnson, Megan P, DO  Multiple Vitamins-Minerals (ESTROVEN MENOPAUSE SUPPLEMENT PO) Take by mouth at bedtime.   Yes [provider]  raloxifene  (EVISTA ) 60 MG tablet Take 1 tablet (60 mg total) by mouth daily. 06/17/23  Yes Johnson, Megan P, DO  clobetasol  ointment (TEMOVATE ) 0.05 % Apply 1 Application topically 2 (two) times daily. 11/05/22   Johnson, Megan P, DO  diclofenac  (VOLTAREN ) 75 MG EC tablet Take 1 tablet (75 mg total) by mouth 2 (two) times daily. 02/23/23   Magdalen Pasco RAMAN, DPM  diclofenac  sodium (VOLTAREN ) 1 % GEL Apply 4 g topically 4 (four) times daily. 09/05/18   Stuart Vernell Norris, PA-C  meloxicam  (MOBIC ) 7.5 MG tablet Take 1 tablet (7.5 mg total) by mouth daily as needed. 06/17/23   Vicci Duwaine SQUIBB, DO    Allergies as of 01/17/2024   (No Known Allergies)    Family History  Problem Relation Age of Onset   Heart disease Mother    Diabetes Mother    Hypertension Mother    Breast cancer Mother 67   Cancer Mother    Heart disease Father    Diabetes Father    Heart attack Brother 61       CABG x 3   Heart disease Brother    Hypertension Brother    Cancer Maternal Grandmother        spine   Heart disease Maternal Grandfather    Hearing loss Maternal Grandfather    Heart disease Paternal Grandmother    Emphysema Paternal Grandfather     Social History   Socioeconomic History   Marital status: Married    Spouse name: Not on file   Number of  children: Not on file   Years of education: 12   Highest education level: 12th grade  Occupational History   Occupation: retired  Tobacco Use   Smoking status: Never   Smokeless tobacco: Never  Vaping Use   Vaping status: Never Used  Substance and Sexual Activity   Alcohol use: Not Currently   Drug use: No   Sexual activity: Not Currently    Birth control/protection: None  Other Topics Concern   Not on file  Social History Narrative   Not on file   Social Drivers of Health   Financial Resource Strain: Low Risk  (12/15/2023)   Overall Financial Resource Strain (CARDIA)    Difficulty of Paying Living Expenses: Not hard at all  Food Insecurity: No Food Insecurity (12/15/2023)   Hunger Vital Sign    Worried About Running Out of Food in the Last Year: Never true    Ran Out of Food in the Last Year: Never true  Transportation Needs: No Transportation Needs (12/15/2023)   PRAPARE - Administrator, Civil Service (Medical): No    Lack of Transportation (Non-Medical): No  Physical Activity: Sufficiently Active (12/15/2023)   Exercise Vital Sign    Days of Exercise per Week: 2 days    Minutes of Exercise per Session: 120 min  Stress: No Stress Concern Present (12/15/2023)   [employer] of Occupational Health - Occupational Stress Questionnaire    Feeling of Stress: Not at all  Social Connections: Socially Integrated (12/15/2023)   Social Connection and Isolation Panel    Frequency of Communication with Friends and Family: Three times a week    Frequency of Social Gatherings with Friends and Family: Twice a week    Attends Religious Services: More than 4 times per year    Active Member of [employer] or Organizations: Yes    Attends Banker Meetings: 1 to 4 times per year    Marital Status: Married  Catering manager Violence: Not At Risk (06/11/2021)   Humiliation, Afraid, Rape, and Kick questionnaire    Fear of Current or Ex-Partner: No    Emotionally Abused:  No    Physically Abused: No    Sexually Abused: No    Review of Systems: See HPI, otherwise negative ROS  Physical Exam: BP (!) 143/76   Pulse 88   Temp 98 F (36.7 C) (Temporal)   Resp 20   Ht 5' 2.5 (1.588 m)   Wt 106.6 kg   LMP  (LMP Unknown)   SpO2 97%   BMI 42.30 kg/m  General:   Alert,  pleasant and cooperative in NAD Head:  Normocephalic and atraumatic. Lungs:  Clear to auscultation.    Heart:  Regular rate and rhythm.   Impression/Plan: Heather James is here for ophthalmic surgery.  Risks, benefits, limitations, and alternatives regarding ophthalmic surgery have been reviewed with the patient.  Questions have been answered.  All parties agreeable.   MITTIE GASKIN, MD  02/29/2024, 10:43 AM

## 2024-02-29 NOTE — Op Note (Signed)
 OPERATIVE NOTE  Heather James [id number] 02/29/2024   PREOPERATIVE DIAGNOSIS:  Nuclear sclerotic cataract left eye. H25.12   POSTOPERATIVE DIAGNOSIS:    Nuclear sclerotic cataract left eye.     PROCEDURE:  Phacoemusification with posterior chamber intraocular lens placement of the left eye  Ultrasound time: Procedure(s): PHACOEMULSIFICATION, CATARACT, WITH IOL INSERTION 5.03, 00:35.3 (Left)  LENS:   Implant Name Type Inv. Item Serial No. Manufacturer Lot No. LRB No. Used Action  LENS IOL TECNIS EYHANCE 23.5 - [id number] Intraocular Lens LENS IOL TECNIS EYHANCE 23.5 [id number] [employer]  Left 1 Implanted      SURGEON:  Dene FABIENE Etienne, MD   ANESTHESIA:  Topical with tetracaine  drops and 2% Xylocaine  jelly, augmented with 1% preservative-free intracameral lidocaine .    COMPLICATIONS:  None.   DESCRIPTION OF PROCEDURE:  The patient was identified in the holding room and transported to the operating room and placed in the supine position under the operating microscope.  The left eye was identified as the operative eye and it was prepped and draped in the usual sterile ophthalmic fashion.   A 1 millimeter clear-corneal paracentesis was made at the 1:30 position.  0.5 ml of preservative-free 1% lidocaine  was injected into the anterior chamber.  The anterior chamber was filled with Viscoat viscoelastic.  A 2.4 millimeter keratome was used to make a near-clear corneal incision at the 10:30 position.  .  A curvilinear capsulorrhexis was made with a cystotome and capsulorrhexis forceps.  Balanced salt solution was used to hydrodissect and hydrodelineate the nucleus.   Phacoemulsification was then used in stop and chop fashion to remove the lens nucleus and epinucleus.  The remaining cortex was then removed using the irrigation and aspiration handpiece. Provisc was then placed into the capsular bag to distend it for lens placement.  A lens was then injected into the capsular bag.  The  remaining viscoelastic was aspirated.   Wounds were hydrated with balanced salt solution.  The anterior chamber was inflated to a physiologic pressure with balanced salt solution.  No wound leaks were noted. Cefuroxime  0.1 ml of a 10mg /ml solution was injected into the anterior chamber for a dose of 1 mg of intracameral antibiotic at the completion of the case.   Timolol  and Brimonidine  drops were applied to the eye.  The patient was taken to the recovery room in stable condition without complications of anesthesia or surgery.  Heather James 02/29/2024, 11:36 AM

## 2024-02-29 NOTE — Transfer of Care (Signed)
 Immediate Anesthesia Transfer of Care Note  Patient: Heather James  Procedure(s) Performed: PHACOEMULSIFICATION, CATARACT, WITH IOL INSERTION 5.03, (Left)  Patient Location: PACU  Anesthesia Type: MAC  Level of Consciousness: awake, alert  and patient cooperative  Airway and Oxygen Therapy: Patient Spontanous Breathing and Patient connected to supplemental oxygen  Post-op Assessment: Post-op Vital signs reviewed, Patient's Cardiovascular Status Stable, Respiratory Function Stable, Patent Airway and No signs of Nausea or vomiting  Post-op Vital Signs: Reviewed and stable  Complications: No notable events documented.

## 2024-02-29 NOTE — Anesthesia Postprocedure Evaluation (Signed)
 Anesthesia Post Note  Patient: Azarie Coriz Frentz  Procedure(s) Performed: PHACOEMULSIFICATION, CATARACT, WITH IOL INSERTION 5.03, 00:35.3 (Left)  Patient location during evaluation: PACU Anesthesia Type: MAC Level of consciousness: awake and alert Pain management: pain level controlled Vital Signs Assessment: post-procedure vital signs reviewed and stable Respiratory status: spontaneous breathing, nonlabored ventilation, respiratory function stable and patient connected to nasal cannula oxygen Cardiovascular status: stable and blood pressure returned to baseline Postop Assessment: no apparent nausea or vomiting Anesthetic complications: no   No notable events documented.   Last Vitals:  Vitals:   02/29/24 1140 02/29/24 1143  BP:  139/81  Pulse: 82 83  Resp: 14 19  Temp:    SpO2: 94% 96%    Last Pain:  Vitals:   02/29/24 1143  TempSrc:   PainSc: 0-No pain                 Almir Botts C Cleston Lautner

## 2024-03-05 ENCOUNTER — Encounter: Payer: Self-pay | Admitting: Family Medicine

## 2024-03-05 ENCOUNTER — Ambulatory Visit (INDEPENDENT_AMBULATORY_CARE_PROVIDER_SITE_OTHER): Admitting: Family Medicine

## 2024-03-05 VITALS — BP 107/63 | HR 82 | Temp 94.2°F | Wt 223.4 lb

## 2024-03-05 DIAGNOSIS — E7800 Pure hypercholesterolemia, unspecified: Secondary | ICD-10-CM

## 2024-03-05 DIAGNOSIS — I10 Essential (primary) hypertension: Secondary | ICD-10-CM

## 2024-03-05 DIAGNOSIS — Z0000 Encounter for general adult medical examination without abnormal findings: Secondary | ICD-10-CM | POA: Diagnosis not present

## 2024-03-05 DIAGNOSIS — Z1231 Encounter for screening mammogram for malignant neoplasm of breast: Secondary | ICD-10-CM

## 2024-03-05 LAB — MICROALBUMIN, URINE WAIVED
Creatinine, Urine Waived: 100 mg/dL (ref 10–300)
Microalb, Ur Waived: 80 mg/L — ABNORMAL HIGH (ref 0–19)

## 2024-03-05 MED ORDER — ATORVASTATIN CALCIUM 20 MG PO TABS: ORAL_TABLET | ORAL | 1 refills | Status: DC

## 2024-03-05 MED ORDER — RALOXIFENE HCL 60 MG PO TABS: 60.0000 mg | ORAL_TABLET | Freq: Every day | ORAL | 3 refills | Status: AC

## 2024-03-05 MED ORDER — ALBUTEROL SULFATE HFA 108 (90 BASE) MCG/ACT IN AERS: 2.0000 | INHALATION_SPRAY | Freq: Four times a day (QID) | RESPIRATORY_TRACT | 6 refills | Status: AC | PRN

## 2024-03-05 MED ORDER — HYDROCHLOROTHIAZIDE 25 MG PO TABS: 25.0000 mg | ORAL_TABLET | ORAL | 0 refills | Status: DC

## 2024-03-05 NOTE — Progress Notes (Signed)
 BP 107/63 (BP Location: Left Arm, Patient Position: Sitting, Cuff Size: Large)   Pulse 82   Temp (!) 94.2 F (34.6 C) (Oral)   Wt 223 lb 6.4 oz (101.3 kg)   LMP  (LMP Unknown)   SpO2 96%   BMI 40.21 kg/m    Subjective:    Patient ID: Heather James, female    DOB: 1951/06/15, 73 y.o.   MRN: [id number]  HPI: Sakeenah Valcarcel is a 73 y.o. female presenting on 03/05/2024 for comprehensive medical examination. Current medical complaints include:  HYPERTENSION / HYPERLIPIDEMIA Satisfied with current treatment? yes Duration of hypertension: chronic BP monitoring frequency: not checking BP medication side effects: no Past BP meds: HCTZ Duration of hyperlipidemia: chronic Cholesterol medication side effects: no Cholesterol supplements: none Past cholesterol medications: atorvastatin  Medication compliance: excellent compliance Aspirin: yes Recent stressors: no Recurrent headaches: no Visual changes: no Palpitations: no Dyspnea: no Chest pain: no Lower extremity edema: no Dizzy/lightheaded: no  Menopausal Symptoms: no  Depression Screen done today and results listed below:     03/05/2024    8:48 AM 07/27/2023   10:06 AM 06/17/2023    8:05 AM 05/03/2023    1:49 PM 12/16/2022    8:07 AM  Depression screen PHQ 2/9  Decreased Interest 0 0 0 0 0  Down, Depressed, Hopeless 0 0 0 0 0  PHQ - 2 Score 0 0 0 0 0  Altered sleeping 0 0 0 1 0  Tired, decreased energy 0 1 0 0 0  Change in appetite 0 0 0 0 0  Feeling bad or failure about yourself  0 0 0 0 0  Trouble concentrating 0 0 0 0 0  Moving slowly or fidgety/restless 0 0 0 0 0  Suicidal thoughts 0 0 0 0 0  PHQ-9 Score 0 1 0 1 0  Difficult doing work/chores  Somewhat difficult Not difficult at all  Not difficult at all    Past Medical History:  Past Medical History:  Diagnosis Date   Advanced care planning/counseling discussion 05/30/2017   Cancer (HCC)    skin ca on leg   CSF leak from ear    Facial asymmetry,  acquired    Hypercholesteremia    Hypertension    Obesity    Osteopenia     Surgical History:  Past Surgical History:  Procedure Laterality Date   BREAST BIOPSY Left 11/21/1996   neg   BREAST BIOPSY Right 08/18/2021   affirm bx 2 area , x marker, path pending   BREAST SURGERY Left    cyst removed   CATARACT EXTRACTION W/PHACO Right 02/15/2024   Procedure: PHACOEMULSIFICATION, CATARACT, WITH IOL INSERTION 6.46 00:31.5;  Surgeon: Mittie Gaskin, MD;  Location: [facility];  Service: Ophthalmology;  Laterality: Right;   CATARACT EXTRACTION W/PHACO Left 02/29/2024   Procedure: PHACOEMULSIFICATION, CATARACT, WITH IOL INSERTION 5.03, 00:35.3;  Surgeon: Mittie Gaskin, MD;  Location: [facility];  Service: Ophthalmology;  Laterality: Left;   COLONOSCOPY WITH PROPOFOL  N/A 03/14/2018   Procedure: COLONOSCOPY WITH PROPOFOL ;  Surgeon: Toledo, Ladell POUR, MD;  Location: [facility];  Service: Gastroenterology;  Laterality: N/A;   COLONOSCOPY WITH PROPOFOL  N/A 05/11/2023   Procedure: COLONOSCOPY WITH PROPOFOL ;  Surgeon: Toledo, Ladell POUR, MD;  Location: [facility];  Service: Gastroenterology;  Laterality: N/A;   FACIAL NERVE SURGERY     PILONIDAL CYST EXCISION     SKIN CANCER EXCISION Right    right leg, April 2017   TRIGGER FINGER RELEASE Right  Medications:  Current Outpatient Medications on File Prior to Visit  Medication Sig   aspirin 81 MG tablet Take 81 mg by mouth daily.   clobetasol  ointment (TEMOVATE ) 0.05 % Apply 1 Application topically 2 (two) times daily.   diclofenac  sodium (VOLTAREN ) 1 % GEL Apply 4 g topically 4 (four) times daily.   meloxicam  (MOBIC ) 7.5 MG tablet Take 1 tablet (7.5 mg total) by mouth daily as needed.   mometasone  (NASONEX ) 50 MCG/ACT nasal spray Place 2 sprays into the nose daily.   Multiple Vitamins-Minerals (ESTROVEN MENOPAUSE SUPPLEMENT PO) Take by mouth at bedtime.   No current facility-administered medications on  file prior to visit.    Allergies:  No Known Allergies  Social History:  Social History   Socioeconomic History   Marital status: Married    Spouse name: Not on file   Number of children: Not on file   Years of education: 12   Highest education level: 12th grade  Occupational History   Occupation: retired  Tobacco Use   Smoking status: Never   Smokeless tobacco: Never  Vaping Use   Vaping status: Never Used  Substance and Sexual Activity   Alcohol use: Not Currently   Drug use: No   Sexual activity: Not Currently    Birth control/protection: None  Other Topics Concern   Not on file  Social History Narrative   Not on file   Social Drivers of Health   Financial Resource Strain: Low Risk  (03/02/2024)   Overall Financial Resource Strain (CARDIA)    Difficulty of Paying Living Expenses: Not hard at all  Food Insecurity: No Food Insecurity (03/02/2024)   Hunger Vital Sign    Worried About Running Out of Food in the Last Year: Never true    Ran Out of Food in the Last Year: Never true  Transportation Needs: No Transportation Needs (03/02/2024)   PRAPARE - Administrator, Civil Service (Medical): No    Lack of Transportation (Non-Medical): No  Physical Activity: Sufficiently Active (03/02/2024)   Exercise Vital Sign    Days of Exercise per Week: 2 days    Minutes of Exercise per Session: 120 min  Stress: No Stress Concern Present (03/02/2024)   [employer] of Occupational Health - Occupational Stress Questionnaire    Feeling of Stress: Not at all  Social Connections: Socially Integrated (03/02/2024)   Social Connection and Isolation Panel    Frequency of Communication with Friends and Family: Three times a week    Frequency of Social Gatherings with Friends and Family: Twice a week    Attends Religious Services: More than 4 times per year    Active Member of [employer] or Organizations: Yes    Attends Banker Meetings: 1 to 4 times per year    Marital  Status: Married  Catering manager Violence: Not At Risk (06/11/2021)   Humiliation, Afraid, Rape, and Kick questionnaire    Fear of Current or Ex-Partner: No    Emotionally Abused: No    Physically Abused: No    Sexually Abused: No   Social History   Tobacco Use  Smoking Status Never  Smokeless Tobacco Never   Social History   Substance and Sexual Activity  Alcohol Use Not Currently    Family History:  Family History  Problem Relation Age of Onset   Heart disease Mother    Diabetes Mother    Hypertension Mother    Breast cancer Mother 33   Cancer Mother  Heart disease Father    Diabetes Father    Heart attack Brother 62       CABG x 3   Heart disease Brother    Hypertension Brother    Cancer Maternal Grandmother        spine   Heart disease Maternal Grandfather    Hearing loss Maternal Grandfather    Heart disease Paternal Grandmother    Emphysema Paternal Grandfather     Past medical history, surgical history, medications, allergies, family history and social history reviewed with patient today and changes made to appropriate areas of the chart.   Review of Systems  Constitutional: Negative.   HENT: Negative.    Eyes:  Positive for blurred vision. Negative for double vision, photophobia, pain, discharge and redness.  Respiratory: Negative.    Cardiovascular: Negative.   Gastrointestinal: Negative.   Genitourinary: Negative.   Musculoskeletal: Negative.   Skin: Negative.   Neurological: Negative.   Endo/Heme/Allergies: Negative.   Psychiatric/Behavioral: Negative.     All other ROS negative except what is listed above and in the HPI.      Objective:    BP 107/63 (BP Location: Left Arm, Patient Position: Sitting, Cuff Size: Large)   Pulse 82   Temp (!) 94.2 F (34.6 C) (Oral)   Wt 223 lb 6.4 oz (101.3 kg)   LMP  (LMP Unknown)   SpO2 96%   BMI 40.21 kg/m   Wt Readings from Last 3 Encounters:  03/05/24 223 lb 6.4 oz (101.3 kg)  02/29/24 235 lb  (106.6 kg)  02/15/24 239 lb 11.2 oz (108.7 kg)    Physical Exam Vitals and nursing note reviewed.  Constitutional:      General: She is not in acute distress.    Appearance: Normal appearance. She is obese. She is not ill-appearing, toxic-appearing or diaphoretic.  HENT:     Head: Normocephalic and atraumatic.     Right Ear: Tympanic membrane, ear canal and external ear normal. There is no impacted cerumen.     Left Ear: Tympanic membrane, ear canal and external ear normal. There is no impacted cerumen.     Nose: Nose normal. No congestion or rhinorrhea.     Mouth/Throat:     Mouth: Mucous membranes are moist.     Pharynx: Oropharynx is clear. No oropharyngeal exudate or posterior oropharyngeal erythema.  Eyes:     General: No scleral icterus.       Right eye: No discharge.        Left eye: No discharge.     Extraocular Movements: Extraocular movements intact.     Conjunctiva/sclera: Conjunctivae normal.     Pupils: Pupils are equal, round, and reactive to light.  Neck:     Vascular: No carotid bruit.  Cardiovascular:     Rate and Rhythm: Normal rate and regular rhythm.     Pulses: Normal pulses.     Heart sounds: No murmur heard.    No friction rub. No gallop.  Pulmonary:     Effort: Pulmonary effort is normal. No respiratory distress.     Breath sounds: Normal breath sounds. No stridor. No wheezing, rhonchi or rales.  Chest:     Chest wall: No tenderness.  Abdominal:     General: Abdomen is flat. Bowel sounds are normal. There is no distension.     Palpations: Abdomen is soft. There is no mass.     Tenderness: There is no abdominal tenderness. There is no right CVA tenderness, left CVA tenderness,  guarding or rebound.     Hernia: No hernia is present.  Genitourinary:    Comments: Breast and pelvic exams deferred with shared decision making Musculoskeletal:        General: No swelling, tenderness, deformity or signs of injury.     Cervical back: Normal range of motion and  neck supple. No rigidity. No muscular tenderness.     Right lower leg: No edema.     Left lower leg: No edema.  Lymphadenopathy:     Cervical: No cervical adenopathy.  Skin:    General: Skin is warm and dry.     Capillary Refill: Capillary refill takes less than 2 seconds.     Coloration: Skin is not jaundiced or pale.     Findings: No bruising, erythema, lesion or rash.  Neurological:     General: No focal deficit present.     Mental Status: She is alert and oriented to person, place, and time. Mental status is at baseline.     Cranial Nerves: No cranial nerve deficit.     Sensory: No sensory deficit.     Motor: No weakness.     Coordination: Coordination normal.     Gait: Gait normal.     Deep Tendon Reflexes: Reflexes normal.  Psychiatric:        Mood and Affect: Mood normal.        Behavior: Behavior normal.        Thought Content: Thought content normal.        Judgment: Judgment normal.     Results for orders placed or performed in visit on 06/17/23  CBC with Differential/Platelet   Collection Time: 06/17/23  8:22 AM  Result Value Ref Range   WBC 5.7 3.4 - 10.8 x10E3/uL   RBC 4.83 3.77 - 5.28 x10E6/uL   Hemoglobin 14.7 11.1 - 15.9 g/dL   Hematocrit 55.4 65.9 - 46.6 %   MCV 92 79 - 97 fL   MCH 30.4 26.6 - 33.0 pg   MCHC 33.0 31.5 - 35.7 g/dL   RDW 87.0 88.2 - 84.5 %   Platelets 267 150 - 450 x10E3/uL   Neutrophils 71 Not Estab. %   Lymphs 18 Not Estab. %   Monocytes 8 Not Estab. %   Eos 2 Not Estab. %   Basos 1 Not Estab. %   Neutrophils Absolute 4.0 1.4 - 7.0 x10E3/uL   Lymphocytes Absolute 1.0 0.7 - 3.1 x10E3/uL   Monocytes Absolute 0.4 0.1 - 0.9 x10E3/uL   EOS (ABSOLUTE) 0.1 0.0 - 0.4 x10E3/uL   Basophils Absolute 0.1 0.0 - 0.2 x10E3/uL   Immature Granulocytes 0 Not Estab. %   Immature Grans (Abs) 0.0 0.0 - 0.1 x10E3/uL  Comprehensive metabolic panel   Collection Time: 06/17/23  8:22 AM  Result Value Ref Range   Glucose 98 70 - 99 mg/dL   BUN 15 8 - 27  mg/dL   Creatinine, Ser 9.13 0.57 - 1.00 mg/dL   eGFR 71 >40 fO/fpw/8.26   BUN/Creatinine Ratio 17 12 - 28   Sodium 142 134 - 144 mmol/L   Potassium 4.3 3.5 - 5.2 mmol/L   Chloride 104 96 - 106 mmol/L   CO2 20 20 - 29 mmol/L   Calcium  9.9 8.7 - 10.3 mg/dL   Total Protein 6.6 6.0 - 8.5 g/dL   Albumin 4.3 3.8 - 4.8 g/dL   Globulin, Total 2.3 1.5 - 4.5 g/dL   Bilirubin Total 1.0 0.0 - 1.2 mg/dL   Alkaline Phosphatase  111 44 - 121 IU/L   AST 29 0 - 40 IU/L   ALT 26 0 - 32 IU/L  Lipid Panel w/o Chol/HDL Ratio   Collection Time: 06/17/23  8:22 AM  Result Value Ref Range   Cholesterol, Total 138 100 - 199 mg/dL   Triglycerides 78 0 - 149 mg/dL   HDL 54 >60 mg/dL   VLDL Cholesterol Cal 15 5 - 40 mg/dL   LDL Chol Calc (NIH) 69 0 - 99 mg/dL  TSH   Collection Time: 06/17/23  8:22 AM  Result Value Ref Range   TSH 1.350 0.450 - 4.500 uIU/mL      Assessment & Plan:   Problem List Items Addressed This Visit       Cardiovascular and Mediastinum   Hypertension   Doing great with BP running low- we will cut her hydrochlorothiazide  in half and recheck in about 3 months. Call with any concerns.       Relevant Medications   hydrochlorothiazide  (HYDRODIURIL ) 25 MG tablet   atorvastatin  (LIPITOR) 20 MG tablet   Other Relevant Orders   CBC with Differential/Platelet   Comprehensive metabolic panel with GFR   TSH   Microalbumin, Urine Waived     Other   Morbid obesity (HCC)   Congratulated patient on 16lb weight loss with effort! Continue diet and exercise. Recheck 3 months.       Hypercholesteremia   Under good control on current regimen. Continue current regimen. Continue to monitor. Call with any concerns. Refills given. Labs drawn today.        Relevant Medications   hydrochlorothiazide  (HYDRODIURIL ) 25 MG tablet   atorvastatin  (LIPITOR) 20 MG tablet   Other Relevant Orders   CBC with Differential/Platelet   Comprehensive metabolic panel with GFR   Lipid Panel w/o Chol/HDL  Ratio   Other Visit Diagnoses       Routine general medical examination at a health care facility    -  Primary   Vaccines up to date/given elsewhere. Screening labs checked today. Mammo due in February- ordered. Continue diet and exercise. Call with any concerns.     Encounter for screening mammogram for malignant neoplasm of breast       Mammo ordered today.   Relevant Orders   MM 3D SCREENING MAMMOGRAM BILATERAL BREAST        Follow up plan: Return in about 3 months (around 06/04/2024) for As scheduled.   LABORATORY TESTING:  - Pap smear: not applicable  IMMUNIZATIONS:   - Tdap: Tetanus vaccination status reviewed: last tetanus booster within 10 years. - Influenza: Given elsewhere - Pneumovax: Up to date - Prevnar: Up to date - COVID: Refused - HPV: Not applicable - Shingrix vaccine: Up to date  SCREENING: -Mammogram: Ordered today  - Colonoscopy: Up to date  - Bone Density: Up to date   PATIENT COUNSELING:   Advised to take 1 mg of folate supplement per day if capable of pregnancy.   Sexuality: Discussed sexually transmitted diseases, partner selection, use of condoms, avoidance of unintended pregnancy  and contraceptive alternatives.   Advised to avoid cigarette smoking.  I discussed with the patient that most people either abstain from alcohol or drink within safe limits (<=14/week and <=4 drinks/occasion for males, <=7/weeks and <= 3 drinks/occasion for females) and that the risk for alcohol disorders and other health effects rises proportionally with the number of drinks per week and how often a drinker exceeds daily limits.  Discussed cessation/primary prevention of drug use  and availability of treatment for abuse.   Diet: Encouraged to adjust caloric intake to maintain  or achieve ideal body weight, to reduce intake of dietary saturated fat and total fat, to limit sodium intake by avoiding high sodium foods and not adding table salt, and to maintain adequate  dietary potassium and calcium  preferably from fresh fruits, vegetables, and low-fat dairy products.    stressed the importance of regular exercise  Injury prevention: Discussed safety belts, safety helmets, smoke detector, smoking near bedding or upholstery.   Dental health: Discussed importance of regular tooth brushing, flossing, and dental visits.    NEXT PREVENTATIVE PHYSICAL DUE IN 1 YEAR. Return in about 3 months (around 06/04/2024) for As scheduled.

## 2024-03-05 NOTE — Assessment & Plan Note (Signed)
 Doing great with BP running low- we will cut her hydrochlorothiazide  in half and recheck in about 3 months. Call with any concerns.

## 2024-03-05 NOTE — Assessment & Plan Note (Signed)
 Congratulated patient on 16lb weight loss with effort! Continue diet and exercise. Recheck 3 months.

## 2024-03-05 NOTE — Assessment & Plan Note (Signed)
 Under good control on current regimen. Continue current regimen. Continue to monitor. Call with any concerns. Refills given. Labs drawn today.

## 2024-03-06 ENCOUNTER — Ambulatory Visit: Payer: Self-pay | Admitting: Family Medicine

## 2024-03-06 ENCOUNTER — Encounter: Payer: Self-pay | Admitting: Family Medicine

## 2024-03-06 LAB — CBC WITH DIFFERENTIAL/PLATELET
Basophils Absolute: 0 x10E3/uL (ref 0.0–0.2)
Basos: 1 %
EOS (ABSOLUTE): 0.1 x10E3/uL (ref 0.0–0.4)
Eos: 1 %
Hematocrit: 48.5 % — ABNORMAL HIGH (ref 34.0–46.6)
Hemoglobin: 15.7 g/dL (ref 11.1–15.9)
Immature Grans (Abs): 0 x10E3/uL (ref 0.0–0.1)
Immature Granulocytes: 0 %
Lymphocytes Absolute: 1.3 x10E3/uL (ref 0.7–3.1)
Lymphs: 21 %
MCH: 30.8 pg (ref 26.6–33.0)
MCHC: 32.4 g/dL (ref 31.5–35.7)
MCV: 95 fL (ref 79–97)
Monocytes Absolute: 0.5 x10E3/uL (ref 0.1–0.9)
Monocytes: 8 %
Neutrophils Absolute: 4.2 x10E3/uL (ref 1.4–7.0)
Neutrophils: 69 %
Platelets: 292 x10E3/uL (ref 150–450)
RBC: 5.1 x10E6/uL (ref 3.77–5.28)
RDW: 12.6 % (ref 11.7–15.4)
WBC: 6.2 x10E3/uL (ref 3.4–10.8)

## 2024-03-06 LAB — COMPREHENSIVE METABOLIC PANEL WITH GFR
ALT: 22 IU/L (ref 0–32)
AST: 21 IU/L (ref 0–40)
Albumin: 4.2 g/dL (ref 3.8–4.8)
Alkaline Phosphatase: 124 IU/L — ABNORMAL HIGH (ref 44–121)
BUN/Creatinine Ratio: 16 (ref 12–28)
BUN: 14 mg/dL (ref 8–27)
Bilirubin Total: 1 mg/dL (ref 0.0–1.2)
CO2: 25 mmol/L (ref 20–29)
Calcium: 9.7 mg/dL (ref 8.7–10.3)
Chloride: 102 mmol/L (ref 96–106)
Creatinine, Ser: 0.85 mg/dL (ref 0.57–1.00)
Globulin, Total: 2.6 g/dL (ref 1.5–4.5)
Glucose: 102 mg/dL — ABNORMAL HIGH (ref 70–99)
Potassium: 4.1 mmol/L (ref 3.5–5.2)
Sodium: 141 mmol/L (ref 134–144)
Total Protein: 6.8 g/dL (ref 6.0–8.5)
eGFR: 72 mL/min/1.73 (ref >59)

## 2024-03-06 LAB — LIPID PANEL W/O CHOL/HDL RATIO
Cholesterol, Total: 143 mg/dL (ref 100–199)
HDL: 52 mg/dL (ref >39)
LDL Chol Calc (NIH): 76 mg/dL (ref 0–99)
Triglycerides: 80 mg/dL (ref 0–149)
VLDL Cholesterol Cal: 15 mg/dL (ref 5–40)

## 2024-03-06 LAB — TSH: TSH: 1.52 u[IU]/mL (ref 0.450–4.500)

## 2024-06-18 ENCOUNTER — Encounter: Payer: Self-pay | Admitting: Family Medicine

## 2024-06-18 ENCOUNTER — Ambulatory Visit (INDEPENDENT_AMBULATORY_CARE_PROVIDER_SITE_OTHER): Payer: Self-pay | Admitting: Family Medicine

## 2024-06-18 VITALS — BP 114/70 | HR 67 | Temp 98.3°F | Ht 62.0 in | Wt 237.4 lb

## 2024-06-18 DIAGNOSIS — I10 Essential (primary) hypertension: Secondary | ICD-10-CM

## 2024-06-18 DIAGNOSIS — Z0000 Encounter for general adult medical examination without abnormal findings: Secondary | ICD-10-CM

## 2024-06-18 LAB — BAYER DCA HB A1C WAIVED: HB A1C (BAYER DCA - WAIVED): 5.8 % — ABNORMAL HIGH (ref 4.8–5.6)

## 2024-06-18 MED ORDER — HYDROCHLOROTHIAZIDE 25 MG PO TABS: 25.0000 mg | ORAL_TABLET | ORAL | 0 refills | Status: AC

## 2024-06-18 MED ORDER — ATORVASTATIN CALCIUM 20 MG PO TABS: ORAL_TABLET | ORAL | 0 refills | Status: AC

## 2024-06-18 NOTE — Assessment & Plan Note (Signed)
 Doing great on lower dose of hydrochlorothiazide - she is having some minor swelling, OK to take extra dose occasionally. Call with any concerns. Follow up 3 months.

## 2024-06-18 NOTE — Progress Notes (Deleted)
 "  Chief Complaint  Patient presents with   Hypertension   Obesity   Medicare Wellness     Subjective:   Heather James is a 73 y.o. female who presents for a Medicare Annual Wellness Visit.  Visit info / Clinical Intake: Medicare Wellness Visit Type:: Subsequent Annual Wellness Visit Persons participating in visit and providing information:: patient Medicare Wellness Visit Mode:: In-person (required for WTM) Interpreter Needed?: No Pre-visit prep was completed: no AWV questionnaire completed by patient prior to visit?: no Living arrangements:: lives with spouse/significant other Patient's Overall Health Status Rating: very good Typical amount of pain: none Does pain affect daily life?: no Are you currently prescribed opioids?: no  Dietary Habits and Nutritional Risks How many meals a day?: 3 Eats fruit and vegetables daily?: yes Most meals are obtained by: preparing own meals In the last 2 weeks, have you had any of the following?: none Diabetic:: no  Functional Status Activities of Daily Living (to include ambulation/medication): Independent Ambulation: Independent Medication Administration: Independent Home Management (perform basic housework or laundry): Independent Manage your own finances?: yes Primary transportation is: driving Concerns about vision?: no *vision screening is required for WTM* Concerns about hearing?: no  Fall Screening Falls in the past year?: 0 Number of falls in past year: 0 Was there an injury with Fall?: 0 Fall Risk Category Calculator: 0 Patient Fall Risk Level: Low Fall Risk  Fall Risk Patient at Risk for Falls Due to: No Fall Risks Fall risk Follow up: Falls evaluation completed  Home and Transportation Safety: All rugs have non-skid backing?: N/A, no rugs All stairs or steps have railings?: yes Grab bars in the bathtub or shower?: yes Have non-skid surface in bathtub or shower?: yes Good home lighting?: yes Regular seat belt  use?: yes Hospital stays in the last year:: no  Cognitive Assessment Difficulty concentrating, remembering, or making decisions? : no Will 6CIT or Mini Cog be Completed: yes What year is it?: 0 points What month is it?: 0 points Give patient an address phrase to remember (5 components): [address] in [place], [place] About what time is it?: 0 points Count backwards from 20 to 1: 0 points Say the months of the year in reverse: 0 points Repeat the address phrase from earlier: 0 points 6 CIT Score: 0 points  Advance Directives (For Healthcare) Does Patient Have a Medical Advance Directive?: Yes Does patient want to make changes to medical advance directive?: No - Patient declined Type of Advance Directive: Healthcare Power of [place]; Living will Copy of Healthcare Power of Attorney in Chart?: No - copy requested Copy of Living Will in Chart?: No - copy requested  Reviewed/Updated  Reviewed/Updated: Reviewed All (Medical, Surgical, Family, Medications, Allergies, Care Teams, Patient Goals)    Allergies (verified) Patient has no known allergies.   Current Medications (verified) Outpatient Encounter Medications as of 06/18/2024  Medication Sig   albuterol  (VENTOLIN  HFA) 108 (90 Base) MCG/ACT inhaler Inhale 2 puffs into the lungs every 6 (six) hours as needed for wheezing or shortness of breath.   aspirin 81 MG tablet Take 81 mg by mouth daily.   atorvastatin  (LIPITOR) 20 MG tablet TAKE 1 TABLET(20 MG) BY MOUTH DAILY Strength: 20 mg   clobetasol  ointment (TEMOVATE ) 0.05 % Apply 1 Application topically 2 (two) times daily.   diclofenac  sodium (VOLTAREN ) 1 % GEL Apply 4 g topically 4 (four) times daily.   hydrochlorothiazide  (HYDRODIURIL ) 25 MG tablet Take 1 tablet (25 mg total) by mouth every  other day.   meloxicam  (MOBIC ) 7.5 MG tablet Take 1 tablet (7.5 mg total) by mouth daily as needed.   mometasone  (NASONEX ) 50 MCG/ACT nasal spray Place 2 sprays into the nose daily.   Multiple  Vitamins-Minerals (ESTROVEN MENOPAUSE SUPPLEMENT PO) Take by mouth at bedtime.   raloxifene  (EVISTA ) 60 MG tablet Take 1 tablet (60 mg total) by mouth daily.   No facility-administered encounter medications on file as of 06/18/2024.    History: Past Medical History:  Diagnosis Date   Advanced care planning/counseling discussion 05/30/2017   Cancer (HCC)    skin ca on leg   CSF leak from ear    Facial asymmetry, acquired    Hypercholesteremia    Hypertension    Obesity    Osteopenia    Past Surgical History:  Procedure Laterality Date   BREAST BIOPSY Left 11/21/1996   neg   BREAST BIOPSY Right 08/18/2021   affirm bx 2 area , x marker, path pending   BREAST SURGERY Left 11/21/96   cyst removed   CATARACT EXTRACTION W/PHACO Right 02/15/2024   Procedure: PHACOEMULSIFICATION, CATARACT, WITH IOL INSERTION 6.46 00:31.5;  Surgeon: Mittie Gaskin, MD;  Location: [facility];  Service: Ophthalmology;  Laterality: Right;   CATARACT EXTRACTION W/PHACO Left 02/29/2024   Procedure: PHACOEMULSIFICATION, CATARACT, WITH IOL INSERTION 5.03, 00:35.3;  Surgeon: Mittie Gaskin, MD;  Location: [facility];  Service: Ophthalmology;  Laterality: Left;   COLONOSCOPY WITH PROPOFOL  N/A 03/14/2018   Procedure: COLONOSCOPY WITH PROPOFOL ;  Surgeon: Toledo, Ladell POUR, MD;  Location: [facility];  Service: Gastroenterology;  Laterality: N/A;   COLONOSCOPY WITH PROPOFOL  N/A 05/11/2023   Procedure: COLONOSCOPY WITH PROPOFOL ;  Surgeon: Toledo, Ladell POUR, MD;  Location: [facility];  Service: Gastroenterology;  Laterality: N/A;   EYE SURGERY  02/29/2024   Removal of cataract in right and left eye   FACIAL NERVE SURGERY     PILONIDAL CYST EXCISION     SKIN CANCER EXCISION Right    right leg, April 2017   TRIGGER FINGER RELEASE Right    Family History  Problem Relation Age of Onset   Heart disease Mother    Diabetes Mother    Hypertension Mother    Breast cancer Mother 67    Cancer Mother    Heart disease Father    Diabetes Father    Heart attack Brother 66       CABG x 3   Heart disease Brother    Hypertension Brother    Cancer Maternal Grandmother        spine   Heart disease Maternal Grandfather    Hearing loss Maternal Grandfather    Heart disease Paternal Grandmother    Emphysema Paternal Grandfather    Social History   Occupational History   Occupation: retired  Tobacco Use   Smoking status: Never   Smokeless tobacco: Never  Vaping Use   Vaping status: Never Used  Substance and Sexual Activity   Alcohol use: Not Currently   Drug use: No   Sexual activity: Not Currently    Birth control/protection: None   Tobacco Counseling Counseling given: Not Answered  SDOH Screenings   Food Insecurity: No Food Insecurity (06/18/2024)  Housing: Low Risk (06/18/2024)  Transportation Needs: No Transportation Needs (06/18/2024)  Utilities: Not At Risk (06/18/2024)  Alcohol Screen: Low Risk (06/11/2021)  Depression (PHQ2-9): Low Risk (06/18/2024)  Financial Resource Strain: Low Risk (06/15/2024)  Physical Activity: Sufficiently Active (06/18/2024)  Social Connections: Socially Integrated (06/18/2024)  Stress: No  Stress Concern Present (06/18/2024)  Tobacco Use: Low Risk (06/18/2024)  Health Literacy: Adequate Health Literacy (06/18/2024)   See flowsheets for full screening details  Depression Screen PHQ 2 & 9 Depression Scale- Over the past 2 weeks, how often have you been bothered by any of the following problems? Little interest or pleasure in doing things: 0 Feeling down, depressed, or hopeless (PHQ Adolescent also includes...irritable): 0 PHQ-2 Total Score: 0 Trouble falling or staying asleep, or sleeping too much: 0 Feeling tired or having little energy: 0 Poor appetite or overeating (PHQ Adolescent also includes...weight loss): 0 Feeling bad about yourself - or that you are a failure or have let yourself or your family down: 0 Trouble  concentrating on things, such as reading the newspaper or watching television (PHQ Adolescent also includes...like school work): 0 Moving or speaking so slowly that other people could have noticed. Or the opposite - being so fidgety or restless that you have been moving around a lot more than usual: 0 Thoughts that you would be better off dead, or of hurting yourself in some way: 0 PHQ-9 Total Score: 0 If you checked off any problems, how difficult have these problems made it for you to do your work, take care of things at home, or get along with other people?: Not difficult at all     Goals Addressed   None          Objective:    Today's Vitals   06/18/24 0800  BP: 114/70  Pulse: 67  Temp: 98.3 F (36.8 C)  TempSrc: Oral  SpO2: 98%  Weight: 237 lb 6.4 oz (107.7 kg)  Height: 5' 2 (1.575 m)  PainSc: 0-No pain   Body mass index is 43.42 kg/m.  Hearing/Vision screen No results found. Immunizations and Health Maintenance Health Maintenance  Topic Date Due   Medicare Annual Wellness (AWV)  06/18/2025   Mammogram  08/01/2025   Bone Density Scan  08/01/2026   Colonoscopy  05/10/2028   DTaP/Tdap/Td (3 - Td or Tdap) 12/15/2032   Pneumococcal Vaccine: 50+ Years  Completed   Influenza Vaccine  Completed   Hepatitis C Screening  Completed   Zoster Vaccines- Shingrix  Completed   Meningococcal B Vaccine  Aged Out   COVID-19 Vaccine  Discontinued        Assessment/Plan:  This is a routine wellness examination for Heather James.  Patient Care Team: Vicci Duwaine SQUIBB, DO as PCP - General (Family Medicine) Jane Charleston, MD as Consulting Physician (Orthopedic Surgery)  I have personally reviewed and noted the following in the patients chart:   Medical and social history Use of alcohol, tobacco or illicit drugs  Current medications and supplements including opioid prescriptions. Functional ability and status Nutritional status Physical activity Advanced directives List of  other physicians Hospitalizations, surgeries, and ER visits in previous 12 months Vitals Screenings to include cognitive, depression, and falls Referrals and appointments  No orders of the defined types were placed in this encounter.  In addition, I have reviewed and discussed with patient certain preventive protocols, quality metrics, and best practice recommendations. A written personalized care plan for preventive services as well as general preventive health recommendations were provided to patient.   Laymon LOISE [place], [place]   06/18/2024   No follow-ups on file.  After Visit Summary: (In Person-Declined) Patient declined AVS at this time.  "

## 2024-06-18 NOTE — Assessment & Plan Note (Signed)
 Weight stable. Encouraged diet and exercise. Call with any concerns.

## 2024-06-18 NOTE — Patient Instructions (Signed)
 Preventative Services:  Health Risk Assessment and Personalized Prevention Plan: Done today Bone Mass Measurements: up to date Breast Cancer Screening: up to date CVD Screening: up to date Cervical Cancer Screening: N/A Colon Cancer Screening: up to date Depression Screening: done today Diabetes Screening: done today Glaucoma Screening: see your eye doctor Hepatitis B vaccine: N/A Hepatitis C screening: up to date HIV Screening: up to date Flu Vaccine: up to date Lung cancer Screening: N/A Obesity Screening: done today Pneumonia Vaccines (2): up to date STI Screening: N/A  Heather James,  Thank you for taking the time for your Medicare Wellness Visit. I appreciate your continued commitment to your health goals. Please review the care plan we discussed, and feel free to reach out if I can assist you further.  Please note that Annual Wellness Visits do not include a physical exam. Some assessments may be limited, especially if the visit was conducted virtually. If needed, we may recommend an in-person follow-up with your provider.  Ongoing Care Seeing your primary care provider every 3 to 6 months helps us  monitor your health and provide consistent, personalized care.   Referrals If a referral was made during today's visit and you haven't received any updates within two weeks, please contact the referred provider directly to check on the status.  Recommended Screenings:  Health Maintenance  Topic Date Due   Medicare Annual Wellness Visit  06/18/2025   Breast Cancer Screening  08/01/2025   Osteoporosis screening with Bone Density Scan  08/01/2026   Colon Cancer Screening  05/10/2028   DTaP/Tdap/Td vaccine (3 - Td or Tdap) 12/15/2032   Pneumococcal Vaccine for age over 40  Completed   Flu Shot  Completed   Hepatitis C Screening  Completed   Zoster (Shingles) Vaccine  Completed   Meningitis B Vaccine  Aged Out   COVID-19 Vaccine  Discontinued       06/18/2024    8:03 AM   Advanced Directives  Does Patient Have a Medical Advance Directive? Yes  Type of Estate Agent of [place];Living will  Does patient want to make changes to medical advance directive? No - Patient declined  Copy of Healthcare Power of Attorney in Chart? No - copy requested    Vision: Annual vision screenings are recommended for early detection of glaucoma, cataracts, and diabetic retinopathy. These exams can also reveal signs of chronic conditions such as diabetes and high blood pressure.  Dental: Annual dental screenings help detect early signs of oral cancer, gum disease, and other conditions linked to overall health, including heart disease and diabetes.  Please see the attached documents for additional preventive care recommendations.

## 2024-06-18 NOTE — Progress Notes (Signed)
 "  Chief Complaint  Patient presents with   Hypertension   Obesity   Medicare Wellness     Subjective:   Heather James is a 73 y.o. female who presents for a Medicare Annual Wellness Visit.  Visit info / Clinical Intake: Medicare Wellness Visit Type:: Subsequent Annual Wellness Visit Persons participating in visit and providing information:: patient Medicare Wellness Visit Mode:: In-person (required for WTM) Interpreter Needed?: No Pre-visit prep was completed: no AWV questionnaire completed by patient prior to visit?: no Living arrangements:: lives with spouse/significant other Patient's Overall Health Status Rating: very good Typical amount of pain: none Does pain affect daily life?: no Are you currently prescribed opioids?: no  Dietary Habits and Nutritional Risks How many meals a day?: 3 Eats fruit and vegetables daily?: yes Most meals are obtained by: preparing own meals In the last 2 weeks, have you had any of the following?: none Diabetic:: no  Functional Status Activities of Daily Living (to include ambulation/medication): Independent Ambulation: Independent Medication Administration: Independent Home Management (perform basic housework or laundry): Independent Manage your own finances?: yes Primary transportation is: driving Concerns about vision?: no *vision screening is required for WTM* Concerns about hearing?: no  Fall Screening Falls in the past year?: 0 Number of falls in past year: 0 Was there an injury with Fall?: 0 Fall Risk Category Calculator: 0 Patient Fall Risk Level: Low Fall Risk  Fall Risk Patient at Risk for Falls Due to: No Fall Risks Fall risk Follow up: Falls evaluation completed  Home and Transportation Safety: All rugs have non-skid backing?: N/A, no rugs All stairs or steps have railings?: yes Grab bars in the bathtub or shower?: yes Have non-skid surface in bathtub or shower?: yes Good home lighting?: yes Regular seat belt  use?: yes Hospital stays in the last year:: no  Cognitive Assessment Difficulty concentrating, remembering, or making decisions? : no Will 6CIT or Mini Cog be Completed: yes What year is it?: 0 points What month is it?: 0 points Give patient an address phrase to remember (5 components): [address] in [place], [place] About what time is it?: 0 points Count backwards from 20 to 1: 0 points Say the months of the year in reverse: 0 points Repeat the address phrase from earlier: 0 points 6 CIT Score: 0 points  Advance Directives (For Healthcare) Does Patient Have a Medical Advance Directive?: Yes Does patient want to make changes to medical advance directive?: No - Patient declined Type of Advance Directive: Healthcare Power of [place]; Living will Copy of Healthcare Power of Attorney in Chart?: No - copy requested Copy of Living Will in Chart?: No - copy requested  Reviewed/Updated  Reviewed/Updated: Reviewed All (Medical, Surgical, Family, Medications, Allergies, Care Teams, Patient Goals)    Allergies (verified) Patient has no known allergies.   Current Medications (verified) Outpatient Encounter Medications as of 06/18/2024  Medication Sig   albuterol  (VENTOLIN  HFA) 108 (90 Base) MCG/ACT inhaler Inhale 2 puffs into the lungs every 6 (six) hours as needed for wheezing or shortness of breath.   aspirin 81 MG tablet Take 81 mg by mouth daily.   atorvastatin  (LIPITOR) 20 MG tablet TAKE 1 TABLET(20 MG) BY MOUTH DAILY Strength: 20 mg   clobetasol  ointment (TEMOVATE ) 0.05 % Apply 1 Application topically 2 (two) times daily.   diclofenac  sodium (VOLTAREN ) 1 % GEL Apply 4 g topically 4 (four) times daily.   hydrochlorothiazide  (HYDRODIURIL ) 25 MG tablet Take 1 tablet (25 mg total) by mouth every  other day.   meloxicam  (MOBIC ) 7.5 MG tablet Take 1 tablet (7.5 mg total) by mouth daily as needed.   mometasone  (NASONEX ) 50 MCG/ACT nasal spray Place 2 sprays into the nose daily.   Multiple  Vitamins-Minerals (ESTROVEN MENOPAUSE SUPPLEMENT PO) Take by mouth at bedtime.   raloxifene  (EVISTA ) 60 MG tablet Take 1 tablet (60 mg total) by mouth daily.   No facility-administered encounter medications on file as of 06/18/2024.    History: Past Medical History:  Diagnosis Date   Advanced care planning/counseling discussion 05/30/2017   Cancer (HCC)    skin ca on leg   CSF leak from ear    Facial asymmetry, acquired    Hypercholesteremia    Hypertension    Obesity    Osteopenia    Past Surgical History:  Procedure Laterality Date   BREAST BIOPSY Left 11/21/1996   neg   BREAST BIOPSY Right 08/18/2021   affirm bx 2 area , x marker, path pending   BREAST SURGERY Left 11/21/96   cyst removed   CATARACT EXTRACTION W/PHACO Right 02/15/2024   Procedure: PHACOEMULSIFICATION, CATARACT, WITH IOL INSERTION 6.46 00:31.5;  Surgeon: Mittie Gaskin, MD;  Location: [facility];  Service: Ophthalmology;  Laterality: Right;   CATARACT EXTRACTION W/PHACO Left 02/29/2024   Procedure: PHACOEMULSIFICATION, CATARACT, WITH IOL INSERTION 5.03, 00:35.3;  Surgeon: Mittie Gaskin, MD;  Location: [facility];  Service: Ophthalmology;  Laterality: Left;   COLONOSCOPY WITH PROPOFOL  N/A 03/14/2018   Procedure: COLONOSCOPY WITH PROPOFOL ;  Surgeon: Toledo, Ladell POUR, MD;  Location: [facility];  Service: Gastroenterology;  Laterality: N/A;   COLONOSCOPY WITH PROPOFOL  N/A 05/11/2023   Procedure: COLONOSCOPY WITH PROPOFOL ;  Surgeon: Toledo, Ladell POUR, MD;  Location: [facility];  Service: Gastroenterology;  Laterality: N/A;   EYE SURGERY  02/29/2024   Removal of cataract in right and left eye   FACIAL NERVE SURGERY     PILONIDAL CYST EXCISION     SKIN CANCER EXCISION Right    right leg, April 2017   TRIGGER FINGER RELEASE Right    Family History  Problem Relation Age of Onset   Heart disease Mother    Diabetes Mother    Hypertension Mother    Breast cancer Mother 39    Cancer Mother    Heart disease Father    Diabetes Father    Heart attack Brother 77       CABG x 3   Heart disease Brother    Hypertension Brother    Cancer Maternal Grandmother        spine   Heart disease Maternal Grandfather    Hearing loss Maternal Grandfather    Heart disease Paternal Grandmother    Emphysema Paternal Grandfather    Social History   Occupational History   Occupation: retired  Tobacco Use   Smoking status: Never   Smokeless tobacco: Never  Vaping Use   Vaping status: Never Used  Substance and Sexual Activity   Alcohol use: Not Currently   Drug use: No   Sexual activity: Not Currently    Birth control/protection: None   Tobacco Counseling Counseling given: Not Answered  SDOH Screenings   Food Insecurity: No Food Insecurity (06/18/2024)  Housing: Low Risk (06/18/2024)  Transportation Needs: No Transportation Needs (06/18/2024)  Utilities: Not At Risk (06/18/2024)  Alcohol Screen: Low Risk (06/11/2021)  Depression (PHQ2-9): Low Risk (06/18/2024)  Financial Resource Strain: Low Risk (06/15/2024)  Physical Activity: Sufficiently Active (06/18/2024)  Social Connections: Socially Integrated (06/18/2024)  Stress: No  Stress Concern Present (06/18/2024)  Tobacco Use: Low Risk (06/18/2024)  Health Literacy: Adequate Health Literacy (06/18/2024)   See flowsheets for full screening details  Depression Screen PHQ 2 & 9 Depression Scale- Over the past 2 weeks, how often have you been bothered by any of the following problems? Little interest or pleasure in doing things: 0 Feeling down, depressed, or hopeless (PHQ Adolescent also includes...irritable): 0 PHQ-2 Total Score: 0 Trouble falling or staying asleep, or sleeping too much: 0 Feeling tired or having little energy: 0 Poor appetite or overeating (PHQ Adolescent also includes...weight loss): 0 Feeling bad about yourself - or that you are a failure or have let yourself or your family down: 0 Trouble  concentrating on things, such as reading the newspaper or watching television (PHQ Adolescent also includes...like school work): 0 Moving or speaking so slowly that other people could have noticed. Or the opposite - being so fidgety or restless that you have been moving around a lot more than usual: 0 Thoughts that you would be better off dead, or of hurting yourself in some way: 0 PHQ-9 Total Score: 0 If you checked off any problems, how difficult have these problems made it for you to do your work, take care of things at home, or get along with other people?: Not difficult at all     Goals Addressed   None          Objective:    Today's Vitals   06/18/24 0800  BP: 114/70  Pulse: 67  Temp: 98.3 F (36.8 C)  TempSrc: Oral  SpO2: 98%  Weight: 237 lb 6.4 oz (107.7 kg)  Height: 5' 2 (1.575 m)  PainSc: 0-No pain   Body mass index is 43.42 kg/m.  Hearing/Vision screen No results found. Immunizations and Health Maintenance Health Maintenance  Topic Date Due   Medicare Annual Wellness (AWV)  06/18/2025   Mammogram  08/01/2025   Bone Density Scan  08/01/2026   Colonoscopy  05/10/2028   DTaP/Tdap/Td (3 - Td or Tdap) 12/15/2032   Pneumococcal Vaccine: 50+ Years  Completed   Influenza Vaccine  Completed   Hepatitis C Screening  Completed   Zoster Vaccines- Shingrix  Completed   Meningococcal B Vaccine  Aged Out   COVID-19 Vaccine  Discontinued        Assessment/Plan:  This is a routine wellness examination for Heather James.  Patient Care Team: Vicci Duwaine SQUIBB, DO as PCP - General (Family Medicine) Jane Charleston, MD as Consulting Physician (Orthopedic Surgery)  I have personally reviewed and noted the following in the patients chart:   Medical and social history Use of alcohol, tobacco or illicit drugs  Current medications and supplements including opioid prescriptions. Functional ability and status Nutritional status Physical activity Advanced directives List of  other physicians Hospitalizations, surgeries, and ER visits in previous 12 months Vitals Screenings to include cognitive, depression, and falls Referrals and appointments  No orders of the defined types were placed in this encounter.  In addition, I have reviewed and discussed with patient certain preventive protocols, quality metrics, and best practice recommendations. A written personalized care plan for preventive services as well as general preventive health recommendations were provided to patient.   Duwaine Vicci, DO   06/18/2024   No follow-ups on file.  After Visit Summary: (In Person-Printed) AVS printed and given to the patient   "

## 2024-06-18 NOTE — Progress Notes (Signed)
 "  BP 114/70   Pulse 67   Temp 98.3 F (36.8 C) (Oral)   Ht 5' 2 (1.575 m)   Wt 237 lb 6.4 oz (107.7 kg)   LMP  (LMP Unknown)   SpO2 98%   BMI 43.42 kg/m    Subjective:    Patient ID: Heather James, female    DOB: [birth year], 73 y.o.   MRN: [id number]  HPI: Heather James is a 73 y.o. female presenting on 06/18/2024 for comprehensive medical examination. Current medical complaints include:  HYPERTENSION  Hypertension status: controlled  Satisfied with current treatment? yes Duration of hypertension: chronic BP monitoring frequency:  rarely BP medication side effects:  no Medication compliance: excellent compliance Previous BP meds:HCTZ Aspirin: no Recurrent headaches: no Visual changes: no Palpitations: no Dyspnea: no Chest pain: no Lower extremity edema: yes Dizzy/lightheaded: no   Menopausal Symptoms: no  Functional Status Survey:       06/18/2024    8:03 AM 07/27/2023   10:06 AM 06/17/2023    8:05 AM 12/16/2022    8:07 AM 11/19/2022    8:15 AM  Fall Risk   Falls in the past year? 0 0 0 0 0  Number falls in past yr: 0 0 0 0 0  Injury with Fall? 0 0  0  0  0   Risk for fall due to : No Fall Risks No Fall Risks No Fall Risks No Fall Risks No Fall Risks  Follow up Falls evaluation completed Falls evaluation completed  Falls evaluation completed Falls evaluation completed     Data saved with a previous flowsheet row definition    Depression Screen    06/18/2024    8:08 AM 03/05/2024    8:48 AM 07/27/2023   10:06 AM 06/17/2023    8:05 AM 05/03/2023    1:49 PM  Depression screen PHQ 2/9  Decreased Interest 0 0 0 0 0  Down, Depressed, Hopeless 0 0 0 0 0  PHQ - 2 Score 0 0 0 0 0  Altered sleeping 0 0 0 0 1  Tired, decreased energy 0 0 1 0 0  Change in appetite 0 0 0 0 0  Feeling bad or failure about yourself  0 0 0 0 0  Trouble concentrating 0 0 0 0 0  Moving slowly or fidgety/restless 0 0 0 0 0  Suicidal thoughts 0 0 0 0 0  PHQ-9 Score 0 0  1  0   1   Difficult doing work/chores Not difficult at all  Somewhat difficult Not difficult at all      Data saved with a previous flowsheet row definition     Advanced Directives Does patient have a HCPOA?    no If yes, name and contact information:  Does patient have a living will or MOST form?  no  Past Medical History:  Past Medical History:  Diagnosis Date   Advanced care planning/counseling discussion 05/30/2017   Cancer (HCC)    skin ca on leg   CSF leak from ear    Facial asymmetry, acquired    Hypercholesteremia    Hypertension    Obesity    Osteopenia     Surgical History:  Past Surgical History:  Procedure Laterality Date   BREAST BIOPSY Left 11/21/1996   neg   BREAST BIOPSY Right 08/18/2021   affirm bx 2 area , x marker, path pending   BREAST SURGERY Left 11/21/96   cyst removed   CATARACT EXTRACTION W/PHACO  Right 02/15/2024   Procedure: PHACOEMULSIFICATION, CATARACT, WITH IOL INSERTION 6.46 00:31.5;  Surgeon: Mittie Gaskin, MD;  Location: [facility];  Service: Ophthalmology;  Laterality: Right;   CATARACT EXTRACTION W/PHACO Left 02/29/2024   Procedure: PHACOEMULSIFICATION, CATARACT, WITH IOL INSERTION 5.03, 00:35.3;  Surgeon: Mittie Gaskin, MD;  Location: [facility];  Service: Ophthalmology;  Laterality: Left;   COLONOSCOPY WITH PROPOFOL  N/A 03/14/2018   Procedure: COLONOSCOPY WITH PROPOFOL ;  Surgeon: Toledo, Ladell POUR, MD;  Location: [facility];  Service: Gastroenterology;  Laterality: N/A;   COLONOSCOPY WITH PROPOFOL  N/A 05/11/2023   Procedure: COLONOSCOPY WITH PROPOFOL ;  Surgeon: Toledo, Ladell POUR, MD;  Location: [facility];  Service: Gastroenterology;  Laterality: N/A;   EYE SURGERY  02/29/2024   Removal of cataract in right and left eye   FACIAL NERVE SURGERY     PILONIDAL CYST EXCISION     SKIN CANCER EXCISION Right    right leg, April 2017   TRIGGER FINGER RELEASE Right     Medications:  Current Outpatient  Medications on File Prior to Visit  Medication Sig   albuterol  (VENTOLIN  HFA) 108 (90 Base) MCG/ACT inhaler Inhale 2 puffs into the lungs every 6 (six) hours as needed for wheezing or shortness of breath.   aspirin 81 MG tablet Take 81 mg by mouth daily.   clobetasol  ointment (TEMOVATE ) 0.05 % Apply 1 Application topically 2 (two) times daily.   diclofenac  sodium (VOLTAREN ) 1 % GEL Apply 4 g topically 4 (four) times daily.   meloxicam  (MOBIC ) 7.5 MG tablet Take 1 tablet (7.5 mg total) by mouth daily as needed.   mometasone  (NASONEX ) 50 MCG/ACT nasal spray Place 2 sprays into the nose daily.   Multiple Vitamins-Minerals (ESTROVEN MENOPAUSE SUPPLEMENT PO) Take by mouth at bedtime.   raloxifene  (EVISTA ) 60 MG tablet Take 1 tablet (60 mg total) by mouth daily.   No current facility-administered medications on file prior to visit.    Allergies:  Allergies[1]  Social History:  Social History   Socioeconomic History   Marital status: Married    Spouse name: Not on file   Number of children: Not on file   Years of education: 12   Highest education level: 12th grade  Occupational History   Occupation: retired  Tobacco Use   Smoking status: Never   Smokeless tobacco: Never  Vaping Use   Vaping status: Never Used  Substance and Sexual Activity   Alcohol use: Not Currently   Drug use: No   Sexual activity: Not Currently    Birth control/protection: None  Other Topics Concern   Not on file  Social History Narrative   Not on file   Social Drivers of Health   Tobacco Use: Low Risk (06/18/2024)   Patient History    Smoking Tobacco Use: Never    Smokeless Tobacco Use: Never    Passive Exposure: Not on file  Financial Resource Strain: Low Risk (06/15/2024)   Overall Financial Resource Strain (CARDIA)    Difficulty of Paying Living Expenses: Not hard at all  Food Insecurity: No Food Insecurity (06/18/2024)   Epic    Worried About Radiation Protection Practitioner of Food in the Last Year: Never true     Ran Out of Food in the Last Year: Never true  Transportation Needs: No Transportation Needs (06/18/2024)   Epic    Lack of Transportation (Medical): No    Lack of Transportation (Non-Medical): No  Physical Activity: Sufficiently Active (06/18/2024)   Exercise Vital Sign    Days  of Exercise per Week: 2 days    Minutes of Exercise per Session: 120 min  Stress: No Stress Concern Present (06/18/2024)   [employer] of Occupational Health - Occupational Stress Questionnaire    Feeling of Stress: Not at all  Social Connections: Socially Integrated (06/18/2024)   Social Connection and Isolation Panel    Frequency of Communication with Friends and Family: More than three times a week    Frequency of Social Gatherings with Friends and Family: More than three times a week    Attends Religious Services: More than 4 times per year    Active Member of Clubs or Organizations: Yes    Attends Banker Meetings: 1 to 4 times per year    Marital Status: Married  Catering Manager Violence: Not At Risk (06/18/2024)   Epic    Fear of Current or Ex-Partner: No    Emotionally Abused: No    Physically Abused: No    Sexually Abused: No  Depression (PHQ2-9): Low Risk (06/18/2024)   Depression (PHQ2-9)    PHQ-2 Score: 0  Alcohol Screen: Low Risk (06/11/2021)   Alcohol Screen    Last Alcohol Screening Score (AUDIT): 0  Housing: Low Risk (06/18/2024)   Epic    Unable to Pay for Housing in the Last Year: No    Number of Times Moved in the Last Year: 0    Homeless in the Last Year: No  Utilities: Not At Risk (06/18/2024)   Epic    Threatened with loss of utilities: No  Health Literacy: Adequate Health Literacy (06/18/2024)   B1300 Health Literacy    Frequency of need for help with medical instructions: Never   Tobacco Use History[2] Social History   Substance and Sexual Activity  Alcohol Use Not Currently    Family History:  Family History  Problem Relation Age of Onset    Heart disease Mother    Diabetes Mother    Hypertension Mother    Breast cancer Mother 61   Cancer Mother    Heart disease Father    Diabetes Father    Heart attack Brother 29       CABG x 3   Heart disease Brother    Hypertension Brother    Cancer Maternal Grandmother        spine   Heart disease Maternal Grandfather    Hearing loss Maternal Grandfather    Heart disease Paternal Grandmother    Emphysema Paternal Grandfather     Past medical history, surgical history, medications, allergies, family history and social history reviewed with patient today and changes made to appropriate areas of the chart.   Review of Systems  Constitutional: Negative.   Respiratory: Negative.    Cardiovascular:  Positive for leg swelling. Negative for chest pain, palpitations, orthopnea, claudication and PND.  Musculoskeletal: Negative.   Neurological: Negative.   Psychiatric/Behavioral: Negative.      All other ROS negative except what is listed above and in the HPI.      Objective:    BP 114/70   Pulse 67   Temp 98.3 F (36.8 C) (Oral)   Ht 5' 2 (1.575 m)   Wt 237 lb 6.4 oz (107.7 kg)   LMP  (LMP Unknown)   SpO2 98%   BMI 43.42 kg/m   Wt Readings from Last 3 Encounters:  06/18/24 237 lb 6.4 oz (107.7 kg)  03/05/24 223 lb 6.4 oz (101.3 kg)  02/29/24 235 lb (106.6 kg)  Physical Exam Vitals and nursing note reviewed.  Constitutional:      General: She is not in acute distress.    Appearance: Normal appearance. She is not ill-appearing, toxic-appearing or diaphoretic.  HENT:     Head: Normocephalic and atraumatic.     Right Ear: External ear normal.     Left Ear: External ear normal.     Nose: Nose normal.     Mouth/Throat:     Mouth: Mucous membranes are moist.     Pharynx: Oropharynx is clear.  Eyes:     General: No scleral icterus.       Right eye: No discharge.        Left eye: No discharge.     Extraocular Movements: Extraocular movements intact.      Conjunctiva/sclera: Conjunctivae normal.     Pupils: Pupils are equal, round, and reactive to light.  Cardiovascular:     Rate and Rhythm: Normal rate and regular rhythm.     Pulses: Normal pulses.     Heart sounds: Normal heart sounds. No murmur heard.    No friction rub. No gallop.  Pulmonary:     Effort: Pulmonary effort is normal. No respiratory distress.     Breath sounds: Normal breath sounds. No stridor. No wheezing, rhonchi or rales.  Chest:     Chest wall: No tenderness.  Musculoskeletal:        General: Normal range of motion.     Cervical back: Normal range of motion and neck supple.  Skin:    General: Skin is warm and dry.     Capillary Refill: Capillary refill takes less than 2 seconds.     Coloration: Skin is not jaundiced or pale.     Findings: No bruising, erythema, lesion or rash.  Neurological:     General: No focal deficit present.     Mental Status: She is alert and oriented to person, place, and time. Mental status is at baseline.  Psychiatric:        Mood and Affect: Mood normal.        Behavior: Behavior normal.        Thought Content: Thought content normal.        Judgment: Judgment normal.        06/18/2024    8:03 AM 06/17/2023    8:17 AM 06/15/2022    8:48 AM 06/09/2020    8:17 AM 06/07/2019    8:32 AM  6CIT Screen  What Year? 0 points 0 points 0 points 0 points 0 points  What month? 0 points 0 points 0 points 0 points 0 points  What time? 0 points 0 points 0 points 0 points 0 points  Count back from 20 0 points 2 points 0 points 0 points 0 points  Months in reverse 0 points 0 points 0 points 0 points 0 points  Repeat phrase 0 points 0 points 0 points 0 points 0 points  Total Score 0 points 2 points 0 points 0 points 0 points    Results for orders placed or performed in visit on 03/05/24  Microalbumin, Urine Waived   Collection Time: 03/05/24  8:50 AM  Result Value Ref Range   Microalb, Ur Waived 80 (H) 0 - 19 mg/L   Creatinine, Urine  Waived 100 10 - 300 mg/dL   Microalb/Creat Ratio 30-300 (H) <30 mg/g  CBC with Differential/Platelet   Collection Time: 03/05/24  8:51 AM  Result Value Ref Range   WBC 6.2 3.4 -  10.8 x10E3/uL   RBC 5.10 3.77 - 5.28 x10E6/uL   Hemoglobin 15.7 11.1 - 15.9 g/dL   Hematocrit 51.4 (H) 65.9 - 46.6 %   MCV 95 79 - 97 fL   MCH 30.8 26.6 - 33.0 pg   MCHC 32.4 31.5 - 35.7 g/dL   RDW 87.3 88.2 - 84.5 %   Platelets 292 150 - 450 x10E3/uL   Neutrophils 69 Not Estab. %   Lymphs 21 Not Estab. %   Monocytes 8 Not Estab. %   Eos 1 Not Estab. %   Basos 1 Not Estab. %   Neutrophils Absolute 4.2 1.4 - 7.0 x10E3/uL   Lymphocytes Absolute 1.3 0.7 - 3.1 x10E3/uL   Monocytes Absolute 0.5 0.1 - 0.9 x10E3/uL   EOS (ABSOLUTE) 0.1 0.0 - 0.4 x10E3/uL   Basophils Absolute 0.0 0.0 - 0.2 x10E3/uL   Immature Granulocytes 0 Not Estab. %   Immature Grans (Abs) 0.0 0.0 - 0.1 x10E3/uL  Comprehensive metabolic panel with GFR   Collection Time: 03/05/24  8:51 AM  Result Value Ref Range   Glucose 102 (H) 70 - 99 mg/dL   BUN 14 8 - 27 mg/dL   Creatinine, Ser 9.14 0.57 - 1.00 mg/dL   eGFR 72 >40 fO/fpw/8.26   BUN/Creatinine Ratio 16 12 - 28   Sodium 141 134 - 144 mmol/L   Potassium 4.1 3.5 - 5.2 mmol/L   Chloride 102 96 - 106 mmol/L   CO2 25 20 - 29 mmol/L   Calcium  9.7 8.7 - 10.3 mg/dL   Total Protein 6.8 6.0 - 8.5 g/dL   Albumin 4.2 3.8 - 4.8 g/dL   Globulin, Total 2.6 1.5 - 4.5 g/dL   Bilirubin Total 1.0 0.0 - 1.2 mg/dL   Alkaline Phosphatase 124 (H) 44 - 121 IU/L   AST 21 0 - 40 IU/L   ALT 22 0 - 32 IU/L  Lipid Panel w/o Chol/HDL Ratio   Collection Time: 03/05/24  8:51 AM  Result Value Ref Range   Cholesterol, Total 143 100 - 199 mg/dL   Triglycerides 80 0 - 149 mg/dL   HDL 52 >60 mg/dL   VLDL Cholesterol Cal 15 5 - 40 mg/dL   LDL Chol Calc (NIH) 76 0 - 99 mg/dL  TSH   Collection Time: 03/05/24  8:51 AM  Result Value Ref Range   TSH 1.520 0.450 - 4.500 uIU/mL      Assessment & Plan:   Problem  List Items Addressed This Visit       Cardiovascular and Mediastinum   Hypertension   Doing great on lower dose of hydrochlorothiazide - she is having some minor swelling, OK to take extra dose occasionally. Call with any concerns. Follow up 3 months.       Relevant Medications   hydrochlorothiazide  (HYDRODIURIL ) 25 MG tablet   atorvastatin  (LIPITOR) 20 MG tablet   Other Relevant Orders   Basic metabolic panel with GFR     Other   Morbid obesity (HCC)   Weight stable. Encouraged diet and exercise. Call with any concerns.       Relevant Orders   Bayer DCA Hb A1c Waived   Other Visit Diagnoses       Encounter for Medicare annual wellness exam    -  Primary   Preventative care discussed today as below.        Preventative Services:  Health Risk Assessment and Personalized Prevention Plan: Done today Bone Mass Measurements: up to date Breast Cancer Screening: up  to date CVD Screening: up to date Cervical Cancer Screening: N/A Colon Cancer Screening: up to date Depression Screening: done today Diabetes Screening: done today Glaucoma Screening: see your eye doctor Hepatitis B vaccine: N/A Hepatitis C screening: up to date HIV Screening: up to date Flu Vaccine: up to date Lung cancer Screening: N/A Obesity Screening: done today Pneumonia Vaccines (2): up to date STI Screening: N/A  Follow up plan: Return in about 3 months (around 09/16/2024).   LABORATORY TESTING:  - Pap smear: not applicable  IMMUNIZATIONS:   - Tdap: Tetanus vaccination status reviewed: last tetanus booster within 10 years. - Influenza: Up to date - Pneumovax: Up to date - Prevnar: Up to date - Zostavax vaccine: Up to date  SCREENING: -Mammogram: Up to date  - Colonoscopy: Up to date  - Bone Density: Up to date   PATIENT COUNSELING:   Advised to take 1 mg of folate supplement per day if capable of pregnancy.   Sexuality: Discussed sexually transmitted diseases, partner selection, use of  condoms, avoidance of unintended pregnancy  and contraceptive alternatives.   Advised to avoid cigarette smoking.  I discussed with the patient that most people either abstain from alcohol or drink within safe limits (<=14/week and <=4 drinks/occasion for males, <=7/weeks and <= 3 drinks/occasion for females) and that the risk for alcohol disorders and other health effects rises proportionally with the number of drinks per week and how often a drinker exceeds daily limits.  Discussed cessation/primary prevention of drug use and availability of treatment for abuse.   Diet: Encouraged to adjust caloric intake to maintain  or achieve ideal body weight, to reduce intake of dietary saturated fat and total fat, to limit sodium intake by avoiding high sodium foods and not adding table salt, and to maintain adequate dietary potassium and calcium  preferably from fresh fruits, vegetables, and low-fat dairy products.    stressed the importance of regular exercise  Injury prevention: Discussed safety belts, safety helmets, smoke detector, smoking near bedding or upholstery.   Dental health: Discussed importance of regular tooth brushing, flossing, and dental visits.    NEXT PREVENTATIVE PHYSICAL DUE IN 1 YEAR. Return in about 3 months (around 09/16/2024).              [1] No Known Allergies [2]  Social History Tobacco Use  Smoking Status Never  Smokeless Tobacco Never   "

## 2024-06-19 ENCOUNTER — Ambulatory Visit: Payer: Self-pay | Admitting: Family Medicine

## 2024-06-19 LAB — BASIC METABOLIC PANEL WITH GFR
BUN/Creatinine Ratio: 24 (ref 12–28)
BUN: 21 mg/dL (ref 8–27)
CO2: 23 mmol/L (ref 20–29)
Calcium: 9.9 mg/dL (ref 8.7–10.3)
Chloride: 101 mmol/L (ref 96–106)
Creatinine, Ser: 0.89 mg/dL (ref 0.57–1.00)
Glucose: 95 mg/dL (ref 70–99)
Potassium: 3.8 mmol/L (ref 3.5–5.2)
Sodium: 141 mmol/L (ref 134–144)
eGFR: 68 mL/min/1.73

## 2024-08-02 ENCOUNTER — Ambulatory Visit
Admission: RE | Admit: 2024-08-02 | Discharge: 2024-08-02 | Disposition: A | Source: Ambulatory Visit | Attending: Family Medicine

## 2024-08-02 DIAGNOSIS — Z1231 Encounter for screening mammogram for malignant neoplasm of breast: Secondary | ICD-10-CM

## 2024-09-17 ENCOUNTER — Ambulatory Visit: Admitting: Family Medicine
